# Patient Record
Sex: Female | Born: 1935 | Race: White | Hispanic: No | Marital: Married
Health system: Southern US, Community
[De-identification: ages and names within clinical notes are randomized; demographics above are authoritative.]

## PROBLEM LIST (undated history)

## (undated) DIAGNOSIS — I809 Phlebitis and thrombophlebitis of unspecified site: Secondary | ICD-10-CM

## (undated) DIAGNOSIS — C801 Malignant (primary) neoplasm, unspecified: Secondary | ICD-10-CM

## (undated) DIAGNOSIS — T7840XA Allergy, unspecified, initial encounter: Secondary | ICD-10-CM

## (undated) DIAGNOSIS — Z8041 Family history of malignant neoplasm of ovary: Secondary | ICD-10-CM

## (undated) DIAGNOSIS — R42 Dizziness and giddiness: Secondary | ICD-10-CM

## (undated) DIAGNOSIS — C50919 Malignant neoplasm of unspecified site of unspecified female breast: Secondary | ICD-10-CM

## (undated) DIAGNOSIS — Z803 Family history of malignant neoplasm of breast: Secondary | ICD-10-CM

## (undated) DIAGNOSIS — I739 Peripheral vascular disease, unspecified: Secondary | ICD-10-CM

## (undated) DIAGNOSIS — Z923 Personal history of irradiation: Secondary | ICD-10-CM

## (undated) DIAGNOSIS — E079 Disorder of thyroid, unspecified: Secondary | ICD-10-CM

## (undated) DIAGNOSIS — I82409 Acute embolism and thrombosis of unspecified deep veins of unspecified lower extremity: Secondary | ICD-10-CM

## (undated) DIAGNOSIS — M1990 Unspecified osteoarthritis, unspecified site: Secondary | ICD-10-CM

## (undated) DIAGNOSIS — Z800 Family history of malignant neoplasm of digestive organs: Secondary | ICD-10-CM

## (undated) DIAGNOSIS — M79606 Pain in leg, unspecified: Secondary | ICD-10-CM

## (undated) HISTORY — PX: FOOT SURGERY: SHX648

## (undated) HISTORY — DX: Phlebitis and thrombophlebitis of unspecified site: I80.9

## (undated) HISTORY — DX: Dizziness and giddiness: R42

## (undated) HISTORY — PX: TONSILLECTOMY: SUR1361

## (undated) HISTORY — PX: HEMORRHOID SURGERY: SHX153

## (undated) HISTORY — DX: Family history of malignant neoplasm of breast: Z80.3

## (undated) HISTORY — DX: Unspecified osteoarthritis, unspecified site: M19.90

## (undated) HISTORY — DX: Family history of malignant neoplasm of digestive organs: Z80.0

## (undated) HISTORY — PX: CYSTOSCOPY: SUR368

## (undated) HISTORY — PX: DEBRIDEMENT TENNIS ELBOW: SHX1442

## (undated) HISTORY — DX: Disorder of thyroid, unspecified: E07.9

## (undated) HISTORY — DX: Peripheral vascular disease, unspecified: I73.9

## (undated) HISTORY — DX: Malignant (primary) neoplasm, unspecified: C80.1

## (undated) HISTORY — DX: Pain in leg, unspecified: M79.606

## (undated) HISTORY — PX: TOE ARTHROPLASTY: SHX6504

## (undated) HISTORY — DX: Family history of malignant neoplasm of ovary: Z80.41

## (undated) HISTORY — DX: Allergy, unspecified, initial encounter: T78.40XA

## (undated) HISTORY — DX: Acute embolism and thrombosis of unspecified deep veins of unspecified lower extremity: I82.409

---

## 1978-05-31 HISTORY — PX: OTHER SURGICAL HISTORY: SHX169

## 1998-06-23 ENCOUNTER — Other Ambulatory Visit: Admission: RE | Admit: 1998-06-23 | Discharge: 1998-06-23 | Payer: Self-pay | Admitting: Obstetrics & Gynecology

## 1999-06-24 ENCOUNTER — Other Ambulatory Visit: Admission: RE | Admit: 1999-06-24 | Discharge: 1999-06-24 | Payer: Self-pay | Admitting: Obstetrics & Gynecology

## 2000-05-16 ENCOUNTER — Other Ambulatory Visit: Admission: RE | Admit: 2000-05-16 | Discharge: 2000-05-16 | Payer: Self-pay | Admitting: Obstetrics & Gynecology

## 2001-06-02 ENCOUNTER — Other Ambulatory Visit: Admission: RE | Admit: 2001-06-02 | Discharge: 2001-06-02 | Payer: Self-pay | Admitting: Obstetrics & Gynecology

## 2001-11-22 ENCOUNTER — Encounter: Admission: RE | Admit: 2001-11-22 | Discharge: 2001-11-22 | Payer: Self-pay | Admitting: Rheumatology

## 2001-11-22 ENCOUNTER — Encounter: Payer: Self-pay | Admitting: Rheumatology

## 2002-06-11 ENCOUNTER — Other Ambulatory Visit: Admission: RE | Admit: 2002-06-11 | Discharge: 2002-06-11 | Payer: Self-pay | Admitting: Obstetrics & Gynecology

## 2004-06-24 ENCOUNTER — Other Ambulatory Visit: Admission: RE | Admit: 2004-06-24 | Discharge: 2004-06-24 | Payer: Self-pay | Admitting: Obstetrics & Gynecology

## 2005-05-31 HISTORY — PX: BREAST LUMPECTOMY: SHX2

## 2005-06-25 ENCOUNTER — Other Ambulatory Visit: Admission: RE | Admit: 2005-06-25 | Discharge: 2005-06-25 | Payer: Self-pay | Admitting: Obstetrics & Gynecology

## 2005-07-15 ENCOUNTER — Encounter (INDEPENDENT_AMBULATORY_CARE_PROVIDER_SITE_OTHER): Payer: Self-pay | Admitting: *Deleted

## 2005-07-15 ENCOUNTER — Encounter (INDEPENDENT_AMBULATORY_CARE_PROVIDER_SITE_OTHER): Payer: Self-pay | Admitting: Radiology

## 2005-07-15 ENCOUNTER — Encounter: Admission: RE | Admit: 2005-07-15 | Discharge: 2005-07-15 | Payer: Self-pay | Admitting: Obstetrics & Gynecology

## 2005-07-27 ENCOUNTER — Encounter: Admission: RE | Admit: 2005-07-27 | Discharge: 2005-07-27 | Payer: Self-pay | Admitting: General Surgery

## 2005-08-06 ENCOUNTER — Encounter: Admission: RE | Admit: 2005-08-06 | Discharge: 2005-08-06 | Payer: Self-pay | Admitting: General Surgery

## 2005-08-09 ENCOUNTER — Encounter: Admission: RE | Admit: 2005-08-09 | Discharge: 2005-08-09 | Payer: Self-pay | Admitting: General Surgery

## 2005-08-09 ENCOUNTER — Ambulatory Visit (HOSPITAL_BASED_OUTPATIENT_CLINIC_OR_DEPARTMENT_OTHER): Admission: RE | Admit: 2005-08-09 | Discharge: 2005-08-09 | Payer: Self-pay | Admitting: General Surgery

## 2005-08-09 ENCOUNTER — Encounter (INDEPENDENT_AMBULATORY_CARE_PROVIDER_SITE_OTHER): Payer: Self-pay | Admitting: Specialist

## 2005-08-13 ENCOUNTER — Ambulatory Visit: Payer: Self-pay | Admitting: Oncology

## 2005-08-19 ENCOUNTER — Ambulatory Visit: Admission: RE | Admit: 2005-08-19 | Discharge: 2005-11-17 | Payer: Self-pay | Admitting: *Deleted

## 2005-09-20 LAB — CBC WITH DIFFERENTIAL/PLATELET
BASO%: 0.4 % (ref 0.0–2.0)
Basophils Absolute: 0 10*3/uL (ref 0.0–0.1)
EOS%: 1 % (ref 0.0–7.0)
HCT: 37.5 % (ref 34.8–46.6)
HGB: 13.1 g/dL (ref 11.6–15.9)
MCH: 33.1 pg (ref 26.0–34.0)
MCHC: 34.8 g/dL (ref 32.0–36.0)
MCV: 95.2 fL (ref 81.0–101.0)
MONO%: 6.8 % (ref 0.0–13.0)
NEUT%: 59.2 % (ref 39.6–76.8)
RDW: 13.1 % (ref 11.3–14.5)

## 2005-09-20 LAB — COMPREHENSIVE METABOLIC PANEL
AST: 15 U/L (ref 0–37)
Alkaline Phosphatase: 57 U/L (ref 39–117)
BUN: 21 mg/dL (ref 6–23)
Creatinine, Ser: 0.9 mg/dL (ref 0.4–1.2)

## 2005-10-05 ENCOUNTER — Encounter: Admission: RE | Admit: 2005-10-05 | Discharge: 2005-10-05 | Payer: Self-pay | Admitting: *Deleted

## 2005-11-18 ENCOUNTER — Ambulatory Visit: Admission: RE | Admit: 2005-11-18 | Discharge: 2005-12-23 | Payer: Self-pay | Admitting: *Deleted

## 2005-12-14 ENCOUNTER — Ambulatory Visit: Payer: Self-pay | Admitting: Oncology

## 2005-12-14 LAB — COMPREHENSIVE METABOLIC PANEL
ALT: 11 U/L (ref 0–40)
AST: 14 U/L (ref 0–37)
CO2: 30 mEq/L (ref 19–32)
Calcium: 9 mg/dL (ref 8.4–10.5)
Chloride: 104 mEq/L (ref 96–112)
Creatinine, Ser: 0.79 mg/dL (ref 0.40–1.20)
Potassium: 3.9 mEq/L (ref 3.5–5.3)
Sodium: 140 mEq/L (ref 135–145)
Total Protein: 6.7 g/dL (ref 6.0–8.3)

## 2005-12-14 LAB — CBC WITH DIFFERENTIAL/PLATELET
BASO%: 0.3 % (ref 0.0–2.0)
EOS%: 2.1 % (ref 0.0–7.0)
MCH: 33.4 pg (ref 26.0–34.0)
MCHC: 34.9 g/dL (ref 32.0–36.0)
MONO#: 0.4 10*3/uL (ref 0.1–0.9)
NEUT%: 60.8 % (ref 39.6–76.8)
RBC: 3.99 10*6/uL (ref 3.70–5.32)
RDW: 13.6 % (ref 11.3–14.5)
WBC: 4.6 10*3/uL (ref 3.9–10.0)
lymph#: 1.3 10*3/uL (ref 0.9–3.3)

## 2006-06-24 ENCOUNTER — Ambulatory Visit: Payer: Self-pay | Admitting: Oncology

## 2006-06-28 LAB — COMPREHENSIVE METABOLIC PANEL
Albumin: 3.9 g/dL (ref 3.5–5.2)
Alkaline Phosphatase: 60 U/L (ref 39–117)
BUN: 19 mg/dL (ref 6–23)
Glucose, Bld: 86 mg/dL (ref 70–99)
Potassium: 4 mEq/L (ref 3.5–5.3)
Total Bilirubin: 0.5 mg/dL (ref 0.3–1.2)

## 2006-06-28 LAB — CANCER ANTIGEN 27.29: CA 27.29: 33 U/mL (ref 0–39)

## 2006-06-28 LAB — CBC WITH DIFFERENTIAL/PLATELET
Basophils Absolute: 0 10*3/uL (ref 0.0–0.1)
Eosinophils Absolute: 0.1 10*3/uL (ref 0.0–0.5)
HCT: 37.8 % (ref 34.8–46.6)
HGB: 13.1 g/dL (ref 11.6–15.9)
LYMPH%: 30.8 % (ref 14.0–48.0)
MCV: 97.7 fL (ref 81.0–101.0)
MONO%: 7.1 % (ref 0.0–13.0)
NEUT#: 2.7 10*3/uL (ref 1.5–6.5)
NEUT%: 60.2 % (ref 39.6–76.8)
Platelets: 184 10*3/uL (ref 145–400)

## 2006-07-07 ENCOUNTER — Encounter: Admission: RE | Admit: 2006-07-07 | Discharge: 2006-07-07 | Payer: Self-pay | Admitting: Obstetrics & Gynecology

## 2006-08-01 ENCOUNTER — Encounter: Admission: RE | Admit: 2006-08-01 | Discharge: 2006-08-01 | Payer: Self-pay | Admitting: Family Medicine

## 2006-10-28 ENCOUNTER — Encounter: Admission: RE | Admit: 2006-10-28 | Discharge: 2006-10-28 | Payer: Self-pay | Admitting: Unknown Physician Specialty

## 2006-12-26 ENCOUNTER — Ambulatory Visit: Payer: Self-pay | Admitting: Oncology

## 2006-12-28 LAB — COMPREHENSIVE METABOLIC PANEL
ALT: 13 U/L (ref 0–35)
Albumin: 3.6 g/dL (ref 3.5–5.2)
CO2: 27 mEq/L (ref 19–32)
Calcium: 8.6 mg/dL (ref 8.4–10.5)
Chloride: 108 mEq/L (ref 96–112)
Potassium: 4 mEq/L (ref 3.5–5.3)
Sodium: 147 mEq/L — ABNORMAL HIGH (ref 135–145)
Total Protein: 6.2 g/dL (ref 6.0–8.3)

## 2006-12-28 LAB — CBC WITH DIFFERENTIAL/PLATELET
BASO%: 0.5 % (ref 0.0–2.0)
HCT: 36.3 % (ref 34.8–46.6)
MCHC: 35.5 g/dL (ref 32.0–36.0)
MONO#: 0.3 10*3/uL (ref 0.1–0.9)
NEUT%: 63.9 % (ref 39.6–76.8)
WBC: 4.9 10*3/uL (ref 3.9–10.0)
lymph#: 1.4 10*3/uL (ref 0.9–3.3)

## 2006-12-28 LAB — LACTATE DEHYDROGENASE: LDH: 157 U/L (ref 94–250)

## 2007-03-01 ENCOUNTER — Ambulatory Visit: Payer: Self-pay | Admitting: Oncology

## 2007-07-11 ENCOUNTER — Encounter: Admission: RE | Admit: 2007-07-11 | Discharge: 2007-07-11 | Payer: Self-pay | Admitting: Oncology

## 2007-12-22 ENCOUNTER — Ambulatory Visit: Payer: Self-pay | Admitting: Oncology

## 2007-12-22 LAB — COMPREHENSIVE METABOLIC PANEL
Alkaline Phosphatase: 53 U/L (ref 39–117)
BUN: 20 mg/dL (ref 6–23)
Creatinine, Ser: 0.92 mg/dL (ref 0.40–1.20)
Glucose, Bld: 96 mg/dL (ref 70–99)
Sodium: 138 mEq/L (ref 135–145)
Total Bilirubin: 0.8 mg/dL (ref 0.3–1.2)
Total Protein: 6.5 g/dL (ref 6.0–8.3)

## 2007-12-22 LAB — CBC WITH DIFFERENTIAL/PLATELET
Eosinophils Absolute: 0.1 10*3/uL (ref 0.0–0.5)
LYMPH%: 30.8 % (ref 14.0–48.0)
MCV: 98.3 fL (ref 81.0–101.0)
MONO%: 6.9 % (ref 0.0–13.0)
NEUT#: 3.4 10*3/uL (ref 1.5–6.5)
NEUT%: 60.8 % (ref 39.6–76.8)
Platelets: 212 10*3/uL (ref 145–400)
RBC: 3.67 10*6/uL — ABNORMAL LOW (ref 3.70–5.32)

## 2008-07-11 ENCOUNTER — Encounter: Admission: RE | Admit: 2008-07-11 | Discharge: 2008-07-11 | Payer: Self-pay | Admitting: Oncology

## 2008-10-04 ENCOUNTER — Ambulatory Visit (HOSPITAL_BASED_OUTPATIENT_CLINIC_OR_DEPARTMENT_OTHER): Admission: RE | Admit: 2008-10-04 | Discharge: 2008-10-04 | Payer: Self-pay | Admitting: Urology

## 2008-10-04 ENCOUNTER — Encounter (INDEPENDENT_AMBULATORY_CARE_PROVIDER_SITE_OTHER): Payer: Self-pay | Admitting: Urology

## 2008-10-10 ENCOUNTER — Ambulatory Visit (HOSPITAL_COMMUNITY): Admission: RE | Admit: 2008-10-10 | Discharge: 2008-10-10 | Payer: Self-pay | Admitting: Urology

## 2008-12-24 ENCOUNTER — Ambulatory Visit: Payer: Self-pay | Admitting: Oncology

## 2008-12-24 LAB — CBC WITH DIFFERENTIAL/PLATELET
Basophils Absolute: 0 10*3/uL (ref 0.0–0.1)
EOS%: 0.9 % (ref 0.0–7.0)
HCT: 37.5 % (ref 34.8–46.6)
HGB: 12.8 g/dL (ref 11.6–15.9)
MCH: 33.3 pg (ref 25.1–34.0)
MCV: 97.8 fL (ref 79.5–101.0)
NEUT%: 60.6 % (ref 38.4–76.8)
lymph#: 1.6 10*3/uL (ref 0.9–3.3)

## 2008-12-25 LAB — COMPREHENSIVE METABOLIC PANEL
AST: 14 U/L (ref 0–37)
BUN: 22 mg/dL (ref 6–23)
Calcium: 9 mg/dL (ref 8.4–10.5)
Chloride: 102 mEq/L (ref 96–112)
Creatinine, Ser: 0.9 mg/dL (ref 0.40–1.20)
Glucose, Bld: 90 mg/dL (ref 70–99)

## 2008-12-25 LAB — LACTATE DEHYDROGENASE: LDH: 178 U/L (ref 94–250)

## 2009-07-14 ENCOUNTER — Encounter: Admission: RE | Admit: 2009-07-14 | Discharge: 2009-07-14 | Payer: Self-pay | Admitting: Oncology

## 2009-12-23 ENCOUNTER — Ambulatory Visit: Payer: Self-pay | Admitting: Oncology

## 2009-12-25 LAB — COMPREHENSIVE METABOLIC PANEL
ALT: 11 U/L (ref 0–35)
AST: 17 U/L (ref 0–37)
Albumin: 4 g/dL (ref 3.5–5.2)
Alkaline Phosphatase: 56 U/L (ref 39–117)
Chloride: 99 mEq/L (ref 96–112)
Creatinine, Ser: 1.01 mg/dL (ref 0.40–1.20)
Total Bilirubin: 0.5 mg/dL (ref 0.3–1.2)

## 2009-12-25 LAB — CBC WITH DIFFERENTIAL/PLATELET
BASO%: 0.3 % (ref 0.0–2.0)
EOS%: 0.8 % (ref 0.0–7.0)
LYMPH%: 32.3 % (ref 14.0–49.7)
MCV: 97.9 fL (ref 79.5–101.0)
MONO%: 6.1 % (ref 0.0–14.0)
NEUT#: 3.8 10*3/uL (ref 1.5–6.5)
NEUT%: 60.5 % (ref 38.4–76.8)
Platelets: 195 10*3/uL (ref 145–400)
RDW: 13.2 % (ref 11.2–14.5)
WBC: 6.3 10*3/uL (ref 3.9–10.3)

## 2010-06-20 ENCOUNTER — Other Ambulatory Visit: Payer: Self-pay | Admitting: Oncology

## 2010-06-20 DIAGNOSIS — Z1231 Encounter for screening mammogram for malignant neoplasm of breast: Secondary | ICD-10-CM

## 2010-07-15 ENCOUNTER — Ambulatory Visit
Admission: RE | Admit: 2010-07-15 | Discharge: 2010-07-15 | Disposition: A | Discharge status: Home / Self Care | Payer: Medicare Other | Source: Ambulatory Visit | Attending: Oncology | Admitting: Oncology

## 2010-07-15 DIAGNOSIS — Z1231 Encounter for screening mammogram for malignant neoplasm of breast: Secondary | ICD-10-CM

## 2010-08-10 ENCOUNTER — Other Ambulatory Visit: Payer: Self-pay | Admitting: Obstetrics & Gynecology

## 2010-09-08 LAB — POCT I-STAT 4, (NA,K, GLUC, HGB,HCT)
HCT: 43 % (ref 36.0–46.0)
Hemoglobin: 14.6 g/dL (ref 12.0–15.0)

## 2010-10-13 NOTE — Op Note (Signed)
NAMEDARI, CARPENITO            ACCOUNT NO.:  [id number]   MEDICAL RECORD NO.:  [id number]          PATIENT TYPE:  AMB   LOCATION:  [facility]                         FACILITY:  [facility]   PHYSICIAN:  Ronald L. Earlene Plater, M.D.  DATE OF BIRTH:  1932/08/08   DATE OF PROCEDURE:  10/04/2008  DATE OF DISCHARGE:                               OPERATIVE REPORT   DIAGNOSES:  Periurethral lesion, questionable urethral caruncle.   OPERATIVE PROCEDURE:  Excision of periurethral lesion and rigid  cystourethroscopy.   SURGEON:  Dr. Gaynelle Arabian.   ANESTHESIA:  LMA.   ESTIMATED BLOOD LOSS:  15 mL.   TUBES:  A 16-French Silastic Foley catheter.   COMPLICATIONS:  None.   INDICATIONS FOR PROCEDURE:  Ms. Chisom is a very nice 77 year old  white female who has a history of breast cancer.  She presents with  periodic vaginal bleeding.  On examination in the 6 o'clock position has  a significantly reddened inflamed lesion approximately 1 cm in length  extending from the edge of the urethra.  It was felt to most likely be a  urethral caruncle, although it was fairly large for this.  It was  causing some irritation and some bleeding and she could not go on  estrogen due to her prior breast cancer.  After understanding risks,  benefits and alternatives, she has elected to proceed with excisional  biopsy of the lesion and cystourethroscopy.   PROCEDURE IN DETAIL:  The patient was placed in the supine position.  After proper LMA anesthesia, she was placed in the dorsal lithotomy  position and prepped and draped with Betadine in a sterile fashion.  The  lesion was grasped with pickups and excised and submitted to pathology.  The mucosal margin was approximated to the vaginal edge of the urethra  with a running 4-0 chromic catgut.  No other lesions were noted to be  present.  Rigid cystourethroscopy was then performed.  The bladder was  smooth-walled.  Efflux of clear urine was noted from the normally placed  ureteral orifices bilaterally.  The bladder was carefully inspected with  12 and 70 degree lenses and the entire urethra was looked at and noted  to be without lesions.  They cystourethroscope was removed visually and  a 16-French Silastic Foley catheter was passed and the urine was drained  clear.  It might be noted that all intraoperative equipment was latex  freed due to her possible latex allergy.  Urine was clear.  The patient  tolerated the procedure well and was taken to the recovery room stable.      Ronald L. Earlene Plater, M.D.  Electronically Signed     RLD/MEDQ  D:  10/04/2008  T:  10/04/2008  Job:  [id number]   cc:   Ilda Mori, M.D.  Fax: [phone]

## 2010-11-02 ENCOUNTER — Other Ambulatory Visit: Payer: Self-pay | Admitting: Gastroenterology

## 2011-01-11 ENCOUNTER — Other Ambulatory Visit: Payer: Self-pay | Admitting: Oncology

## 2011-01-11 ENCOUNTER — Encounter (HOSPITAL_BASED_OUTPATIENT_CLINIC_OR_DEPARTMENT_OTHER): Payer: Medicare Other | Admitting: Oncology

## 2011-01-11 DIAGNOSIS — D0590 Unspecified type of carcinoma in situ of unspecified breast: Secondary | ICD-10-CM

## 2011-01-11 LAB — CBC WITH DIFFERENTIAL/PLATELET
Eosinophils Absolute: 0.1 10*3/uL (ref 0.0–0.5)
HCT: 36.6 % (ref 34.8–46.6)
HGB: 12.8 g/dL (ref 11.6–15.9)
LYMPH%: 35.1 % (ref 14.0–49.7)
MCH: 34.2 pg — ABNORMAL HIGH (ref 25.1–34.0)
MCV: 97.6 fL (ref 79.5–101.0)
MONO%: 6.2 % (ref 0.0–14.0)
NEUT#: 2.9 10*3/uL (ref 1.5–6.5)
NEUT%: 56.7 % (ref 38.4–76.8)
Platelets: 182 10*3/uL (ref 145–400)

## 2011-01-12 LAB — VITAMIN D 25 HYDROXY (VIT D DEFICIENCY, FRACTURES): Vit D, 25-Hydroxy: 33 ng/mL (ref 30–89)

## 2011-01-12 LAB — COMPREHENSIVE METABOLIC PANEL WITH GFR
ALT: 11 U/L (ref 0–35)
AST: 15 U/L (ref 0–37)
Albumin: 4.1 g/dL (ref 3.5–5.2)
Alkaline Phosphatase: 60 U/L (ref 39–117)
BUN: 20 mg/dL (ref 6–23)
CO2: 27 meq/L (ref 19–32)
Calcium: 9.6 mg/dL (ref 8.4–10.5)
Chloride: 104 meq/L (ref 96–112)
Creatinine, Ser: 0.92 mg/dL (ref 0.50–1.10)
Glucose, Bld: 96 mg/dL (ref 70–99)
Potassium: 4 meq/L (ref 3.5–5.3)
Sodium: 141 meq/L (ref 135–145)
Total Bilirubin: 0.6 mg/dL (ref 0.3–1.2)
Total Protein: 6.8 g/dL (ref 6.0–8.3)

## 2011-01-18 ENCOUNTER — Encounter: Payer: Medicare Other | Admitting: Oncology

## 2011-02-22 ENCOUNTER — Encounter: Payer: Self-pay | Admitting: Vascular Surgery

## 2011-02-24 ENCOUNTER — Encounter: Payer: Self-pay | Admitting: Vascular Surgery

## 2011-02-25 ENCOUNTER — Encounter: Payer: Self-pay | Admitting: Vascular Surgery

## 2011-02-25 ENCOUNTER — Ambulatory Visit (INDEPENDENT_AMBULATORY_CARE_PROVIDER_SITE_OTHER): Payer: Medicare Other | Admitting: Vascular Surgery

## 2011-02-25 VITALS — BP 156/79 | HR 50 | Resp 16 | Ht 69.0 in | Wt 167.0 lb

## 2011-02-25 DIAGNOSIS — M7989 Other specified soft tissue disorders: Secondary | ICD-10-CM

## 2011-02-25 NOTE — Progress Notes (Signed)
[facility] HISTORY AND PHYSICAL   CC: Referring Physician:  History of Present Illness:  Patient is a 77 y.o. year old female who presents for evaluation of left lower extremity swelling. Patient states she has a chronic problems with her left leg for several years. However over the last few months the left leg is worse. She does not know she had a DVT in the past but says she has had several episodes of thrombophlebitis. The leg hurts occasionally and is worse on some days than others. He does not really related to sitting standing or ambulation. She has been wearing compression stockings since 2001 on a daily basis. These are thigh high. She has no history of hypercoagulable state. She has no significant family history of venous disease. She has had no prior ulcerations. She has noticed some skin discoloration that is occurred recently.  Past Medical History  Diagnosis Date  . Leg pain   . Thyroid disease   . Cancer     Right breast  . Phlebitis   . Peripheral vascular disease   . Arthritis   . Vertigo     Past Surgical History  Procedure Date  . Breast lumpectomy 2007    Right lumpectomy with radiation  . Tonsillectomy   . Hemorrhoid surgery   . Debridement tennis elbow     Right elbow   . Cystoscopy     For lesion of the urethra    ROS: [x]  Positive   [ ]  Negative   [ ]  All sytems reviewed and are negative  General:[ ]  Weight loss, [ ]  Fever, [ ]  chills Neurologic: [x ] Dizziness, [ ]  Blackouts, [ ]  Seizure [ ]  Stroke, [ ]  "Mini stroke", [ ]  Slurred speech, [ ]  Temporary blindness;  [ ] weakness,  [ ]  Hoarseness Cardiac: [ ]  Chest pain/pressure, [ ]  Shortness of breath at rest [ ]  Shortness of breath with exertion,  [ ]   Atrial fibrillation or irregular heartbeat Vascular:[x ] Pain in legs with walking, [ ]  Pain in legs at rest ,[ ]  Pain in legs at night,  [ ]   Non-healing ulcer, [x ] Blood clot in vein/DVT,   Pulmonary: [ ]  Home oxygen, [ ]    Productive cough, [ ]  Coughing up blood,  [ ]  Asthma,  [ ]  Wheezing Musculoskeletal:  [x ] Arthritis, [ ]  Low back pain,  [ ]  Joint pain Hematologic:[ ]  Easy Bruising, [ ]  Anemia; [ ]  Hepatitis Gastrointestinal: [ ]  Blood in stool,  [ ]  Gastroesophageal Reflux, [ ]  Trouble swallowing Urinary: [ ]  chronic Kidney disease, [ ]  on HD - [ ]  MWF or [ ]  TTHS, [ ]  Burning with urination, [ ]  Frequent urination, [ ]  Difficulty urinating;  Skin: [ ]  Rashes, [ ]  Wounds Psychological: [ ]  Anxiety,  [ ]  Depression  Social History History  Substance Use Topics  . Smoking status: Never Smoker   . Smokeless tobacco: Never Used  . Alcohol Use: No    Family History Family History  Problem Relation Age of Onset  . Cancer Sister     breast cancer    Allergies  Allergies  Allergen Reactions  . Demerol   . Ibuprofen      Current Outpatient Prescriptions  Medication Sig Dispense Refill  . fluticasone (FLONASE) 50 MCG/ACT nasal spray Place 2 sprays into the nose daily.        Marland Kitchen levothyroxine (SYNTHROID, LEVOTHROID) 88 MCG tablet Take 88 mcg by mouth  daily.        . triamterene-hydrochlorothiazide (DYAZIDE) 37.5-25 MG per capsule Take 1 capsule by mouth every morning.          Physical Examination  Filed Vitals:   02/25/11 1346  BP: 156/79  Pulse: 50  Resp: 16  Height: 5\' 9"  (1.753 m)  Weight: 167 lb (75.751 kg)  SpO2: 97%    Body mass index is 24.66 kg/(m^2).  General:  Alert and oriented, no acute distress HEENT: Normal Neck: No bruit or JVD Pulmonary: Clear to auscultation bilaterally Cardiac: Regular Rate and Rhythm without murmur Gastrointestinal: Soft, non-tender, non-distended, no mass, no scars Skin: No rash, hemosiderin staining left gaiter area medially, no ulcer Extremity Pulses:  2+ radial, brachial, femoral, dorsalis pedis, posterior tibial pulses bilaterally Musculoskeletal: No deformity, left leg edema pitting approx 25% larger than right Neurologic: Upper and  lower extremity motor 5/5 and symmetric  DATA:   Venous duplex from July was reviewed which showed no evidence of DVT ASSESSMENT:   Left lower term a swelling with 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  PLAN: #1 patient will continue her compression therapy of her lower extremities as she has done for several years.  #2 we will obtain a venous duplex exam for competency patency of her deep and superficial venous system and she will return for followup in 2 weeks  #3 patient was reassured today that the venous disease may be chronic in nature but did she does not appear to have an acute clot the hemosiderin staining however may be permanent and make it worse over time if she does have post phlebitic syndrome

## 2011-03-08 ENCOUNTER — Other Ambulatory Visit: Payer: Self-pay | Admitting: Lab

## 2011-03-10 ENCOUNTER — Encounter: Payer: Self-pay | Admitting: Vascular Surgery

## 2011-03-11 ENCOUNTER — Encounter: Payer: Self-pay | Admitting: Vascular Surgery

## 2011-03-11 ENCOUNTER — Ambulatory Visit (INDEPENDENT_AMBULATORY_CARE_PROVIDER_SITE_OTHER): Payer: Medicare Other | Admitting: Vascular Surgery

## 2011-03-11 ENCOUNTER — Other Ambulatory Visit (INDEPENDENT_AMBULATORY_CARE_PROVIDER_SITE_OTHER): Payer: Medicare Other | Admitting: *Deleted

## 2011-03-11 VITALS — BP 147/72 | HR 58 | Resp 16 | Ht 69.0 in | Wt 160.5 lb

## 2011-03-11 DIAGNOSIS — I83893 Varicose veins of bilateral lower extremities with other complications: Secondary | ICD-10-CM

## 2011-03-11 DIAGNOSIS — M79609 Pain in unspecified limb: Secondary | ICD-10-CM

## 2011-03-11 NOTE — Progress Notes (Signed)
Patient returns today for followup. She had a venous duplex exam for competency and patency today. I reviewed and interpreted or venous duplex exam which shows incompetence of the deep and superficial system in the left leg. She did have a competent right superficial and deep system. She has developed a small ulceration on the posterior aspect of the left calf. This is very superficial and should heal up with compression alone. Her greater saphenous diameter on the left side was between 63 and 80 mm in diameter. She has been compliant with her compression stockings since her last visit. We discussed with her continued compliance of her thigh high compression stockings. She will also continue to use nonsteroidals or Tylenol for pain as needed.  Review of systems: She denies shortness of breath or chest pain  Physical Exam: Filed Vitals:   03/11/11 1026  BP: 147/72  Pulse: 58  Resp: 16  Height: 5\' 9"  (1.753 m)  Weight: 160 lb 8 oz (72.802 kg)   Left lower extremity edema unchanged from her previous office visit she has scattered varicosities and spider veins in both lower extremities. She has a less than 1 mm in depth ulceration which is 2 x 1 cm in diameter on the left posterior medial calf, skin otherwise is intact  Assessment: Post phlebitic syndrome left lower extremity but also incompetence of the superficial venous system of the left lower extremity. I believe her symptoms might be improved somewhat by ablation of her left lower extremity. She will try conservative measures for the next few months to see she denies any significant improvement. She returned 3 months time to be seen by Dr. Arbie Cookey or possible evaluation of laser ablation of her left greater saphenous vein.

## 2011-03-16 NOTE — Procedures (Unsigned)
LOWER EXTREMITY VENOUS REFLUX EXAM  INDICATION:  Bilateral lower extremity swelling with varicose veins.  EXAM:  Using color-flow imaging and pulse Doppler spectral analysis, the bilateral common femoral, superficial femoral, popliteal, posterior tibial, greater and lesser saphenous veins are evaluated.  There is evidence suggesting deep venous insufficiency in the left lower extremity.  The left saphenofemoral junction is not competent with Reflux of >573milliseconds. The  GSV is not competent with Reflux of >510milliseconds with the caliber as described below. The right saphenofemoral junction and GSV are competent.   The bilateral proximal short saphenous veins demonstrate competency.  GSV Diameter (used if found to be incompetent only)                                           Right    Left Proximal Greater Saphenous Vein           cm       0.79 cm Proximal-to-mid-thigh                     cm       0.61 cm Mid thigh                                 cm       0.63 cm Mid-distal thigh                          cm       cm Distal thigh                              cm       0.63 cm Knee                                      cm       0.4 cm  IMPRESSION: 1. Left greater saphenous vein is not competent with reflux     >536milliseconds. 2. The left greater saphenous vein is not tortuous. 3. The deep venous system on the left is incompetent with reflux >500     milliseconds. 4. The bilateral small saphenous veins are competent. 5. The right greater saphenous vein is competent.     ___________________________________________ Janetta Hora. Fields, MD  LT/MEDQ  D:  03/11/2011  T:  03/11/2011  Job:  [id number]

## 2011-06-14 ENCOUNTER — Encounter: Payer: Self-pay | Admitting: Vascular Surgery

## 2011-06-14 ENCOUNTER — Other Ambulatory Visit: Payer: Self-pay | Admitting: Oncology

## 2011-06-14 DIAGNOSIS — Z9889 Other specified postprocedural states: Secondary | ICD-10-CM

## 2011-06-14 DIAGNOSIS — Z853 Personal history of malignant neoplasm of breast: Secondary | ICD-10-CM

## 2011-06-15 ENCOUNTER — Encounter: Payer: Self-pay | Admitting: Vascular Surgery

## 2011-06-15 ENCOUNTER — Ambulatory Visit (INDEPENDENT_AMBULATORY_CARE_PROVIDER_SITE_OTHER): Payer: Medicare Other | Admitting: Vascular Surgery

## 2011-06-15 VITALS — BP 137/72 | HR 56 | Resp 18 | Ht 70.0 in | Wt 161.5 lb

## 2011-06-15 DIAGNOSIS — I83893 Varicose veins of bilateral lower extremities with other complications: Secondary | ICD-10-CM | POA: Insufficient documentation

## 2011-06-15 NOTE — Progress Notes (Signed)
Problems with Activities of Daily Living Secondary to Leg Pain  1.  Mrs. Hinchey states she has had to decrease her gardening activities and walking on the treadmill for exercise due to leg pain.  2.  Mrs. Buntrock states that prolonged standing especially working in [employer] is very difficult due to leg pain.  3. Mrs. Zaugg states that prolonged sitting especially on long car rides is very difficult due to leg pain.   Failure of  Conservative Therapy:  1. Worn 20-30 mm Hg thigh high compression hose >3 months with no relief of symptoms.  2. Frequently elevates legs-no relief of symptoms  3. Taken Ibuprofen 600 Mg TID with no relief of symptoms.  The patient has markedly progressive changes of venous stasis disease in her left leg with hemosiderin deposit and excoriated skin. She is having some pain and swelling despite very good compliance with compression and elevation. I reimaged her vein with SonoSite ultrasound and this does show marked dilatation of her great saphenous vein. She does have 2+ dorsalis pedis pulse. I discussed options and recommended laser ablation of her great saphenous vein for improvement in her venous hypertension. She understands that she does have deep venous insufficiency as well and therefore cannot have complete correction of her venous hypertension. She wish to proceed when possible to get her on the schedule

## 2011-06-17 ENCOUNTER — Other Ambulatory Visit: Payer: Self-pay | Admitting: *Deleted

## 2011-06-17 DIAGNOSIS — I83893 Varicose veins of bilateral lower extremities with other complications: Secondary | ICD-10-CM

## 2011-07-05 DIAGNOSIS — Z79899 Other long term (current) drug therapy: Secondary | ICD-10-CM | POA: Diagnosis not present

## 2011-07-05 DIAGNOSIS — E039 Hypothyroidism, unspecified: Secondary | ICD-10-CM | POA: Diagnosis not present

## 2011-07-19 ENCOUNTER — Ambulatory Visit
Admission: RE | Admit: 2011-07-19 | Discharge: 2011-07-19 | Disposition: A | Discharge status: Home / Self Care | Payer: Medicare Other | Source: Ambulatory Visit | Attending: Oncology | Admitting: Oncology

## 2011-07-19 DIAGNOSIS — Z853 Personal history of malignant neoplasm of breast: Secondary | ICD-10-CM

## 2011-07-19 DIAGNOSIS — Z9889 Other specified postprocedural states: Secondary | ICD-10-CM

## 2011-07-28 ENCOUNTER — Encounter: Payer: Self-pay | Admitting: Vascular Surgery

## 2011-07-29 ENCOUNTER — Ambulatory Visit (INDEPENDENT_AMBULATORY_CARE_PROVIDER_SITE_OTHER): Payer: Medicare Other | Admitting: Vascular Surgery

## 2011-07-29 ENCOUNTER — Encounter: Payer: Self-pay | Admitting: Vascular Surgery

## 2011-07-29 VITALS — BP 152/93 | HR 59 | Resp 18 | Ht 69.0 in | Wt 162.3 lb

## 2011-07-29 DIAGNOSIS — I83893 Varicose veins of bilateral lower extremities with other complications: Secondary | ICD-10-CM | POA: Diagnosis not present

## 2011-07-29 HISTORY — PX: ENDOVENOUS ABLATION SAPHENOUS VEIN W/ LASER: SUR449

## 2011-07-29 NOTE — Progress Notes (Signed)
Laser Ablation Procedure      Date: 07/29/2011    Pamela [name] DOB:[birth year]  Consent signed: Yes  Surgeon:T.F. Gean Larose  Procedure: Laser Ablation: left Greater Saphenous Vein  BP 152/93  Pulse 59  Resp 18  Ht 5\' 9"  (1.753 m)  Wt 162 lb 4.8 oz (73.619 kg)  BMI 23.97 kg/m2  Start time: 10:45AM   End time: 11:45AM  Tumescent Anesthesia: 475 cc 0.9% NaCl with 50 cc Lidocaine HCL with 1% Epi and 15 cc 8.4% NaHCO3  Local Anesthesia: 5 cc Lidocaine HCL and NaHCO3 (ratio 2:1)  Continuous Mode: 15 Watts Total Energy 2726 Joules Total Time3:01       Patient tolerated procedure well: Yes  [name], Pamela Rhymes  Description of Procedure:  After marking the course of the saphenous vein and the secondary varicosities in the standing position, the patient was placed on the operating table in the supine position, and the left leg was prepped and draped in sterile fashion. Local anesthetic was administered, and under ultrasound guidance the saphenous vein was accessed with a micro needle and guide wire; then the micro puncture sheath was placed. A guide wire was inserted to the saphenofemoral junction, followed by a 5 french sheath.  The position of the sheath and then the laser fiber below the junction was confirmed using the ultrasound and visualization of the aiming beam.  Tumescent anesthesia was administered along the course of the saphenous vein using ultrasound guidance. Protective laser glasses were placed on the patient, and the laser was fired at at 15 watt continuous mode.  For a total of 2726 joules.  A steri strip was applied to the puncture site.       ABD pads and thigh high compression stockings were applied.  Ace wrap bandages were applied at the top of the saphenofemoral junction.  Blood loss was less than 15 cc.  The patient ambulated out of the operating room having tolerated the procedure well.  Uneventful ablation from mid calf to saphenofemoral junction. She'll be  seen again in one week

## 2011-07-30 ENCOUNTER — Encounter: Payer: Self-pay | Admitting: Vascular Surgery

## 2011-08-02 ENCOUNTER — Encounter: Payer: Self-pay | Admitting: Vascular Surgery

## 2011-08-03 ENCOUNTER — Ambulatory Visit (INDEPENDENT_AMBULATORY_CARE_PROVIDER_SITE_OTHER): Payer: Medicare Other | Admitting: Vascular Surgery

## 2011-08-03 ENCOUNTER — Telehealth: Payer: Self-pay | Admitting: *Deleted

## 2011-08-03 ENCOUNTER — Other Ambulatory Visit: Payer: Self-pay

## 2011-08-03 ENCOUNTER — Encounter (INDEPENDENT_AMBULATORY_CARE_PROVIDER_SITE_OTHER): Payer: Medicare Other | Admitting: *Deleted

## 2011-08-03 ENCOUNTER — Encounter: Payer: Self-pay | Admitting: Vascular Surgery

## 2011-08-03 VITALS — BP 128/67 | HR 79 | Resp 18 | Ht 69.5 in | Wt 163.0 lb

## 2011-08-03 DIAGNOSIS — Z48812 Encounter for surgical aftercare following surgery on the circulatory system: Secondary | ICD-10-CM | POA: Diagnosis not present

## 2011-08-03 DIAGNOSIS — I83893 Varicose veins of bilateral lower extremities with other complications: Secondary | ICD-10-CM | POA: Diagnosis not present

## 2011-08-03 NOTE — Progress Notes (Signed)
Subjective:     Patient ID: Pamela [name], female   DOB: 1933-01-09, 78 y.o.   MRN: [id number]  HPI the same suture O. female had laser ablation left great saphenous vein performed by Dr. early one week ago. She has had some mild to moderate discomfort along the course of the great saphenous vein in the mid to proximal thighs will expect. She's had no distal edema. She denies any chest pain hemoptysis or dyspnea on exertion.  Review of Systems     Objective:   Physical ExamBP 128/67  Pulse 79  Resp 18  Ht 5' 9.5" (1.765 m)  Wt 163 lb (73.936 kg)  BMI 23.73 kg/m2 Left lower extremity exam reveals 3+ femoral dorsalis pedis pulse palpable. There is no distal edema noted. She has mild discomfort along the course of the great saphenous vein from the saphenofemoral junction to the mid thigh.  The downward a venous duplex exam of the left leg which I reviewed and interpreted. She has total closure of the great saphenous vein up to the saphenofemoral junction with some mobile thrombus extending into the common femoral vein but not obstructing it.    Assessment:     Complete closure left great saphenous vein following laser ablation with some mobile thrombus at saphenofemoral junction This will likely resolve spontaneously but will cover her with Coumadin in the interim. This will be managed by her medical doctor -- per Dr. Leonor Liv    Plan:     Patient to return in 2 months for venous duplex exam to confirm resolution of thrombus at saphenofemoral junction and see Dr. early at that time   The patient did develop sudden severe swelling in left leg chest pain hemoptysis shortness of breath she will go to the emergency department immediately.

## 2011-08-03 NOTE — Telephone Encounter (Signed)
Spoke directly to Dr. Leonor Liv. Told her about the results of the duplex ultrasound today and that Dr. Hart Rochester wants the patient to be put on Coumadin. He also wants her to start a baby aspirin every day. She is to be seen again in 2 months for a repeat ultrasound and to see Dr. Arbie Cookey.

## 2011-08-04 NOTE — Procedures (Unsigned)
DUPLEX DEEP VENOUS EXAM - LOWER EXTREMITY  INDICATION:  Left lower extremity ablation.  HISTORY:  Edema: Trauma/Surgery:  One week post ablation. Pain:  Yes. PE:  No. Previous DVT:  No. Anticoagulants:  No. Other:  DUPLEX EXAM:               CFV   SFV   PopV  PTV    GSV               R  L  R  L  R  L  R   L  R  L Thrombosis       +     0     0      0     Closed Spontaneous      +     +     +      + Phasic           +     +     +      + Augmentation     + Compressible Competent  Legend:  + - yes  o - no  p - partial  D - decreased  IMPRESSION: 1. Acute mobile thrombus on the left common femoral vein at     saphenofemoral junction.  This is suggestive of acute deep vein     thrombosis. 2. The greater saphenous vein appears closed from the saphenofemoral     junction to below knee proximal calf.     _____________________________ Pamela [name]. Hart Rochester, M.D.  SS/MEDQ  D:  08/03/2011  T:  08/03/2011  Job:  [id number]

## 2011-08-10 ENCOUNTER — Telehealth: Payer: Self-pay | Admitting: *Deleted

## 2011-08-10 NOTE — Telephone Encounter (Signed)
08/10/2011  Time: 3:08 PM   Patient Name: Pamela [name]  Patient of: T.F. Early  Procedure:Laser Ablation left great saphenous vein  07-29-2011  Reached patient at home and checked  Her status  Yes    Comments/Actions Taken:  Pamela [name] states she is experiencing moderate discomfort left inner thigh.  She is unable to take Ibuprofen but is taking Tylenol 2 tablets every 4 hours as needed for pain. Encouraged her to use ice compress directly to affected site and to elevate left leg when sitting. No complaints of bleeding or swelling.  Reviewed post procedural instructions with Pamela [name] and reminded her of March 5 post laser ablation duplex and appointment (follow up) with Dr. Hart Rochester since Dr. Arbie Cookey is out of town.    @SIGNATURE @

## 2011-08-20 DIAGNOSIS — R5381 Other malaise: Secondary | ICD-10-CM | POA: Diagnosis not present

## 2011-09-16 DIAGNOSIS — C44711 Basal cell carcinoma of skin of unspecified lower limb, including hip: Secondary | ICD-10-CM | POA: Diagnosis not present

## 2011-09-23 HISTORY — PX: OTHER SURGICAL HISTORY: SHX169

## 2011-09-29 ENCOUNTER — Other Ambulatory Visit: Payer: Self-pay | Admitting: *Deleted

## 2011-09-29 DIAGNOSIS — I83893 Varicose veins of bilateral lower extremities with other complications: Secondary | ICD-10-CM

## 2011-10-04 ENCOUNTER — Encounter: Payer: Self-pay | Admitting: Vascular Surgery

## 2011-10-05 ENCOUNTER — Encounter: Payer: Self-pay | Admitting: Vascular Surgery

## 2011-10-05 ENCOUNTER — Ambulatory Visit (INDEPENDENT_AMBULATORY_CARE_PROVIDER_SITE_OTHER): Payer: Medicare Other | Admitting: Vascular Surgery

## 2011-10-05 ENCOUNTER — Encounter (INDEPENDENT_AMBULATORY_CARE_PROVIDER_SITE_OTHER): Payer: Medicare Other | Admitting: *Deleted

## 2011-10-05 VITALS — BP 152/58 | HR 53 | Resp 18 | Ht 70.0 in | Wt 161.9 lb

## 2011-10-05 DIAGNOSIS — Z48812 Encounter for surgical aftercare following surgery on the circulatory system: Secondary | ICD-10-CM

## 2011-10-05 DIAGNOSIS — I83893 Varicose veins of bilateral lower extremities with other complications: Secondary | ICD-10-CM

## 2011-10-05 NOTE — Progress Notes (Signed)
Patient presents day for followup of her left great saphenous vein laser ablation on 07/29/2011. On her one-week postoperative study she did have evidence of mobile thrombus at the saphenofemoral junction extending into but not occluding her left great saphenous vein. She has been on his Xarelto for anticoagulation since this duplex. Patient reports that she has not completely recovered from a standpoint of the preoperative discomfort with aching and swelling in her left calf. She is still very compliant with compression garments as well.  Physical exam: She does have some chronic hemosiderin deposits around her left ankle but no significant swelling and no varicosities  Vascular lab: She underwent repeat venous duplex and that shows complete resolution of thrombus extending from the saphenofemoral junction. She does have closure of her great saphenous vein  Impression and plan successful ablation of left great saphenous vein for superficial venous hypertension. I feel it is safe for her to stop her anticoagulation as of today since this shows no evidence of persistent difficulty. 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

## 2011-10-07 DIAGNOSIS — C44711 Basal cell carcinoma of skin of unspecified lower limb, including hip: Secondary | ICD-10-CM | POA: Diagnosis not present

## 2011-10-12 NOTE — Procedures (Unsigned)
DUPLEX DEEP VENOUS EXAM - LOWER EXTREMITY  INDICATION:  Left greater saphenous ablation 07/29/2011  HISTORY:  Edema:  Yes Trauma/Surgery: Pain:  No PE:  No Previous DVT:  Yes, mobile thrombus at the saphenofemoral junction after ablation found 08/03/2011 by duplex Anticoagulants:  Yes, Xarelto Other:  Status post left greater saphenous vein ablation with mobile thrombus at saphenofemoral junction  DUPLEX EXAM:               CFV   SFV   PopV  PTV    GSV               R  L  R  L  R  L  R   L  R  L Thrombosis       0     0     0      0     Closed * Spontaneous      +     +     +      + Phasic           +     +     +      + Augmentation     +     +     +      + Compressible     +     +     +      + Competent        +     0     0      +  Legend:  + - yes  o - no  p - partial  D - decreased  IMPRESSION: 1. The mobile thrombus of the left saphenofemoral junction appears to     have resolved.  Slow flow is visualized in common femoral vein. 2. *The proximal left greater saphenous vein is open, however, it     appears closed distally with slight flow visualized in the distal     segment of the greater saphenous vein in the distal thigh.   _____________________________ Larina Earthly, M.D.  SS/MEDQ  D:  10/05/2011  T:  10/05/2011  Job:  [id number]

## 2011-12-14 DIAGNOSIS — T148 Other injury of unspecified body region: Secondary | ICD-10-CM | POA: Diagnosis not present

## 2011-12-14 DIAGNOSIS — W57XXXA Bitten or stung by nonvenomous insect and other nonvenomous arthropods, initial encounter: Secondary | ICD-10-CM | POA: Diagnosis not present

## 2011-12-14 DIAGNOSIS — J189 Pneumonia, unspecified organism: Secondary | ICD-10-CM | POA: Diagnosis not present

## 2012-01-11 ENCOUNTER — Other Ambulatory Visit (HOSPITAL_BASED_OUTPATIENT_CLINIC_OR_DEPARTMENT_OTHER): Payer: Medicare Other | Admitting: Lab

## 2012-01-11 DIAGNOSIS — D0590 Unspecified type of carcinoma in situ of unspecified breast: Secondary | ICD-10-CM | POA: Diagnosis not present

## 2012-01-11 DIAGNOSIS — C50419 Malignant neoplasm of upper-outer quadrant of unspecified female breast: Secondary | ICD-10-CM | POA: Diagnosis not present

## 2012-01-11 LAB — CBC WITH DIFFERENTIAL/PLATELET
BASO%: 0.6 % (ref 0.0–2.0)
EOS%: 5.1 % (ref 0.0–7.0)
MCH: 33.1 pg (ref 25.1–34.0)
MCHC: 33.6 g/dL (ref 31.5–36.0)
MONO%: 6.8 % (ref 0.0–14.0)
RDW: 13.3 % (ref 11.2–14.5)
lymph#: 1.8 10*3/uL (ref 0.9–3.3)

## 2012-01-12 ENCOUNTER — Encounter: Payer: Self-pay | Admitting: *Deleted

## 2012-01-12 LAB — COMPREHENSIVE METABOLIC PANEL
ALT: <8 U/L (ref 0–35)
AST: 12 U/L (ref 0–37)
Albumin: 3.6 g/dL (ref 3.5–5.2)
Alkaline Phosphatase: 59 U/L (ref 39–117)
Calcium: 9 mg/dL (ref 8.4–10.5)
Chloride: 105 mEq/L (ref 96–112)
Creatinine, Ser: 0.75 mg/dL (ref 0.50–1.10)
Potassium: 4.2 mEq/L (ref 3.5–5.3)

## 2012-01-12 NOTE — Progress Notes (Unsigned)
Notified pt of vitamin D deficit. 1000U vitamin D daily is recommended

## 2012-01-17 ENCOUNTER — Encounter: Payer: Self-pay | Admitting: *Deleted

## 2012-01-18 ENCOUNTER — Ambulatory Visit: Payer: Medicare Other | Admitting: Family

## 2012-01-19 ENCOUNTER — Telehealth: Payer: Self-pay | Admitting: *Deleted

## 2012-01-19 ENCOUNTER — Encounter: Payer: Self-pay | Admitting: Family

## 2012-01-19 ENCOUNTER — Ambulatory Visit (HOSPITAL_BASED_OUTPATIENT_CLINIC_OR_DEPARTMENT_OTHER): Payer: Medicare Other | Admitting: Family

## 2012-01-19 VITALS — BP 127/66 | HR 56 | Temp 97.8°F | Resp 20 | Ht 70.0 in | Wt 163.0 lb

## 2012-01-19 DIAGNOSIS — Z780 Asymptomatic menopausal state: Secondary | ICD-10-CM

## 2012-01-19 DIAGNOSIS — Z86718 Personal history of other venous thrombosis and embolism: Secondary | ICD-10-CM

## 2012-01-19 DIAGNOSIS — Z853 Personal history of malignant neoplasm of breast: Secondary | ICD-10-CM | POA: Diagnosis not present

## 2012-01-19 NOTE — Patient Instructions (Addendum)
1. Return in 1 year to see Dr. Donnie Coffin. 2. Mammogram as scheduled. 3. Resume calcium and Vitamin D.  4. Bone density. 5. Prescription for right breast prosthesis and bras.

## 2012-01-19 NOTE — Progress Notes (Signed)
[facility]  Name: Pamela [name]                  DATE: 01/19/2012 MRN: [id number]                      DOB: August 06, 1932  REFERRING PHYSICIAN: Marylen Ponto, MD  DIAGNOSIS: Patient Active Problem List   Diagnosis Date Noted  . History of breast cancer 01/19/2012  . Varicose veins of lower extremities with other complications 06/15/2011  . Leg swelling 02/25/2011     Encounter Diagnosis  Name Primary?  . History of breast cancer Yes    PREVIOUS THERAPY: Right lumpectomy with radiation completed 12/16/2005.   CURRENT THERAPY: Surveillance.   INTERIM HISTORY: Has had an eventful healthcare year. Feb. 28 had vein ablation and subsequently developed DVT, left groin. Took Xarelto for 6 months, now discontinued. Recently had basal cell removed from right leg. Just completed antibiotics for Lyme's disease 2 weeks ago. Feels well, all things considered. No self detected breast problems or complaints. No headache or blurred vision. No cough or shortness of breath. No abdominal pain or new bone pain. Bowel and bladder function are normal. Appetite is good, with adequate fluid intake. Remainder of the 10 point  review of systems is negative.  PHYSICAL EXAM: BP 127/66  Pulse 56  Temp 97.8 F (36.6 C)  Resp 20  Ht 5\' 10"  (1.778 m)  Wt 163 lb (73.936 kg)  BMI 23.39 kg/m2 General: Well developed, well nourished, in no acute distress.  EENT: No ocular or oral lesions. No stomatitis.  Respiratory: Lungs are clear to auscultation bilaterally with normal respiratory movement and no accessory muscle use. Cardiac: No murmur, rub or tachycardia. No upper or lower extremity edema.  GI: Abdomen is soft, no palpable hepatosplenomegaly. No fluid wave. No tenderness. Musculoskeletal: No kyphosis, no tenderness over the spine, ribs or hips. Lymph: No cervical, infraclavicular, axillary or inguinal adenopathy. Neuro: No focal neurological deficits. Psych: Alert and oriented X 3, appropriate  mood and affect.  BREAST EXAM: In the supine position, with the right arm over the head, the right nipple is everted. Circumareolar scar is contracted and nipple protrudes and points downward. No periareolar edema or nipple discharge. No mass in any quadrant or subareolar region. No redness of the skin. No right axillary adenopathy. With the left arm over the head, the left nipple is everted. No periareolar edema or nipple discharge. No mass in any quadrant or subareolar region. No redness of the skin. No left axillary adenopathy. Right volume is less than left, owing to generous lumpectomy and radiation.    LABORATORY STUDIES:   Results for orders placed in visit on 01/11/12  CBC WITH DIFFERENTIAL      Component Value Range   WBC 4.8  3.9 - 10.3 10e3/uL   NEUT# 2.4  1.5 - 6.5 10e3/uL   HGB 12.4  11.6 - 15.9 g/dL   HCT 13.0  86.5 - 78.4 %   Platelets 144 (*) 145 - 400 10e3/uL   MCV 98.5  79.5 - 101.0 fL   MCH 33.1  25.1 - 34.0 pg   MCHC 33.6  31.5 - 36.0 g/dL   RBC 6.96  2.95 - 2.84 10e6/uL   RDW 13.3  11.2 - 14.5 %   lymph# 1.8  0.9 - 3.3 10e3/uL   MONO# 0.3  0.1 - 0.9 10e3/uL   Eosinophils Absolute 0.2  0.0 - 0.5 10e3/uL   Basophils  Absolute 0.0  0.0 - 0.1 10e3/uL   NEUT% 50.7  38.4 - 76.8 %   LYMPH% 36.8  14.0 - 49.7 %   MONO% 6.8  0.0 - 14.0 %   EOS% 5.1  0.0 - 7.0 %   BASO% 0.6  0.0 - 2.0 %  COMPREHENSIVE METABOLIC PANEL      Component Value Range   Sodium 140  135 - 145 mEq/L   Potassium 4.2  3.5 - 5.3 mEq/L   Chloride 105  96 - 112 mEq/L   CO2 29  19 - 32 mEq/L   Glucose, Bld 100 (*) 70 - 99 mg/dL   BUN 17  6 - 23 mg/dL   Creatinine, Ser 5.73  0.50 - 1.10 mg/dL   Total Bilirubin 0.4  0.3 - 1.2 mg/dL   Alkaline Phosphatase 59  39 - 117 U/L   AST 12  0 - 37 U/L   ALT <8  0 - 35 U/L   Total Protein 6.2  6.0 - 8.3 g/dL   Albumin 3.6  3.5 - 5.2 g/dL   Calcium 9.0  8.4 - 22.0 mg/dL  VITAMIN D 25 HYDROXY      Component Value Range   Vit D, 25-Hydroxy 26 (*) 30 - 89 ng/mL      IMPRESSION:   1. History DCIS right breast, no evidence of recurrence clinically or mammographically.  2. Last mammo 07/19/2011. Last bone density 07/2009.  3. Asymmetrical breast volume, owing to generous right lumpectomy and radiation.   PLAN:   1. Return in 1 year to see Dr. Donnie Coffin with lab prior.  2. Mammo in Feb. 2014.  3. Resume calcium and Vitamin D.  4. Bone density. 5. Prescription for right breast prosthesis and bras.

## 2012-01-19 NOTE — Telephone Encounter (Signed)
Gave patient appointment for 01-25-2012 bone density at breast center gave patient appointment for 01-16-2013 starting at 9:30am

## 2012-01-25 ENCOUNTER — Ambulatory Visit
Admission: RE | Admit: 2012-01-25 | Discharge: 2012-01-25 | Disposition: A | Discharge status: Home / Self Care | Payer: Medicare Other | Source: Ambulatory Visit | Attending: Family | Admitting: Family

## 2012-01-25 DIAGNOSIS — M899 Disorder of bone, unspecified: Secondary | ICD-10-CM | POA: Diagnosis not present

## 2012-01-25 DIAGNOSIS — Z780 Asymptomatic menopausal state: Secondary | ICD-10-CM | POA: Diagnosis not present

## 2012-01-25 DIAGNOSIS — M949 Disorder of cartilage, unspecified: Secondary | ICD-10-CM | POA: Diagnosis not present

## 2012-02-02 ENCOUNTER — Encounter: Payer: Self-pay | Admitting: *Deleted

## 2012-02-02 ENCOUNTER — Telehealth: Payer: Self-pay | Admitting: *Deleted

## 2012-02-02 NOTE — Telephone Encounter (Signed)
Per NR have notified pt to take calcium 600mg  twice daily in divided doses. And to increase vita D 800 iu in divided doses

## 2012-02-11 DIAGNOSIS — J069 Acute upper respiratory infection, unspecified: Secondary | ICD-10-CM | POA: Diagnosis not present

## 2012-03-20 DIAGNOSIS — Z23 Encounter for immunization: Secondary | ICD-10-CM | POA: Diagnosis not present

## 2012-03-28 DIAGNOSIS — L719 Rosacea, unspecified: Secondary | ICD-10-CM | POA: Diagnosis not present

## 2012-03-31 DIAGNOSIS — R5383 Other fatigue: Secondary | ICD-10-CM | POA: Diagnosis not present

## 2012-03-31 DIAGNOSIS — R5381 Other malaise: Secondary | ICD-10-CM | POA: Diagnosis not present

## 2012-03-31 DIAGNOSIS — R05 Cough: Secondary | ICD-10-CM | POA: Diagnosis not present

## 2012-03-31 DIAGNOSIS — IMO0001 Reserved for inherently not codable concepts without codable children: Secondary | ICD-10-CM | POA: Diagnosis not present

## 2012-03-31 DIAGNOSIS — R599 Enlarged lymph nodes, unspecified: Secondary | ICD-10-CM | POA: Diagnosis not present

## 2012-04-14 DIAGNOSIS — R05 Cough: Secondary | ICD-10-CM | POA: Diagnosis not present

## 2012-04-20 ENCOUNTER — Telehealth: Payer: Self-pay

## 2012-04-20 ENCOUNTER — Encounter: Payer: Self-pay | Admitting: Neurosurgery

## 2012-04-20 ENCOUNTER — Ambulatory Visit (INDEPENDENT_AMBULATORY_CARE_PROVIDER_SITE_OTHER): Payer: Medicare Other | Admitting: Vascular Surgery

## 2012-04-20 ENCOUNTER — Ambulatory Visit (INDEPENDENT_AMBULATORY_CARE_PROVIDER_SITE_OTHER): Payer: Medicare Other | Admitting: Neurosurgery

## 2012-04-20 VITALS — BP 118/57 | HR 66 | Resp 16 | Ht 68.75 in | Wt 163.1 lb

## 2012-04-20 DIAGNOSIS — M7989 Other specified soft tissue disorders: Secondary | ICD-10-CM

## 2012-04-20 DIAGNOSIS — M79609 Pain in unspecified limb: Secondary | ICD-10-CM | POA: Insufficient documentation

## 2012-04-20 DIAGNOSIS — L539 Erythematous condition, unspecified: Secondary | ICD-10-CM | POA: Insufficient documentation

## 2012-04-20 DIAGNOSIS — I83893 Varicose veins of bilateral lower extremities with other complications: Secondary | ICD-10-CM | POA: Diagnosis not present

## 2012-04-20 MED ORDER — CEPHALEXIN 500 MG PO CAPS: 500.0000 mg | ORAL_CAPSULE | Freq: Four times a day (QID) | ORAL | Status: DC

## 2012-04-20 NOTE — Telephone Encounter (Signed)
PC from pt.  C/o onset (L) lower leg burning and stinging last evening.  States she noticed her left lower leg fiery red and painful from ankle to calf area, with some swelling.  States the swelling is a little better today, but the redness and soreness continues.  Denies any open sores.  Discussed w/ Dr. Arbie Cookey.  Recommends pt. have a venous duplex and see a provider today.  Notified pt.; appt. Given for 2:30 PM for vascular study, and 3:00 PM for eval. per nurse practitioner.  Agrees w/ plan.

## 2012-04-20 NOTE — Progress Notes (Signed)
Subjective:     Patient ID: Pamela [name], female   DOB: 1932/08/17, 78 y.o.   MRN: [id number]  HPI: 78 year old female patient last seen by Dr. Arbie Cookey in May status post laser ablation of her left great saphenous vein in February 2013. The patient called the office today asking from home stating that yesterday her left leg started "burning and stinging" without any incident. We brought the patient in for evaluation.   Review of Systems: 12 point review of systems is notable for the difficulties described above otherwise     Objective:   Physical Exam: Afebrile, vital signs are stable, the patient's left lower leg from the ankle to about midshin is very red there is no weeping there is no open wounds. She has about 1+ pitting edema around her ankle and she states this is better than it was yesterday. The patient reports no kind of insect bite or other incident she states a sudden onset of symptoms yesterday.     Assessment:     Dr. Darrick Penna exam the patient as well as I, he feels like there may be some venous insufficiency and possible reflux causing an inflammatory response and has recommended the patient continue her compression hoses as well as antibiotics.    Plan:     Keflex x10 days 4 times a day, return in 2 weeks to followup with Dr. Arbie Cookey per Dr. Darrick Penna, the patient's questions were encouraged and answered, she is in agreement with this plan.  Lauree Chandler ANP  Clinic M.D.: [place]

## 2012-04-20 NOTE — Progress Notes (Signed)
Left lower extremity venous duplex performed @ [facility] 04/20/2012

## 2012-04-25 DIAGNOSIS — R059 Cough, unspecified: Secondary | ICD-10-CM | POA: Diagnosis not present

## 2012-04-25 DIAGNOSIS — R918 Other nonspecific abnormal finding of lung field: Secondary | ICD-10-CM | POA: Diagnosis not present

## 2012-04-25 DIAGNOSIS — R05 Cough: Secondary | ICD-10-CM | POA: Diagnosis not present

## 2012-05-08 ENCOUNTER — Encounter: Payer: Self-pay | Admitting: Vascular Surgery

## 2012-05-09 ENCOUNTER — Encounter: Payer: Self-pay | Admitting: Vascular Surgery

## 2012-05-09 ENCOUNTER — Ambulatory Visit (INDEPENDENT_AMBULATORY_CARE_PROVIDER_SITE_OTHER): Payer: Medicare Other | Admitting: Vascular Surgery

## 2012-05-09 VITALS — BP 162/76 | HR 62 | Resp 16 | Ht 69.0 in | Wt 164.0 lb

## 2012-05-09 DIAGNOSIS — M79609 Pain in unspecified limb: Secondary | ICD-10-CM

## 2012-05-09 DIAGNOSIS — I83893 Varicose veins of bilateral lower extremities with other complications: Secondary | ICD-10-CM | POA: Diagnosis not present

## 2012-05-09 NOTE — Progress Notes (Signed)
The patient has today for continued followup of venous hypertension in her left leg. She had an episode 1 month ago with a sudden onset of a burning sensation in her medial ankle. This was over an area of chronic hemosiderin deposits. She had immediate inflammatory response under this area. She was seen in our office and was treated with a short course of oral antibiotic in case this was a bacterial. She has had continued resolution and that the discomfort has resolved completely. She has been married plan with her compression garments. Duplex at her visit one month ago showed good ablation of her great saphenous vein the left from just below the knee to just at the saphenofemoral junction. There was a perforator at the area of the discomfort.  SonoSite today reveals some edema in the fluid over this area in her medial ankle. She does have a perforator just above the malleolus. On imaging her saphenous vein it is ablated from just below the knee proximally.  Impression and plan: I suspect that she did have a rupture of a small vein at this area causing her to have sudden pain and erythema associated with this. I explained that unlikely for this to be infectious but I did respond to a short course of antibiotics. She is comfortable with continued observation. I did explain the significance of her perforator in that this may or may not be causing some of her discomfort. I explained we would reserve attempted treatment of her perforator 4 recurrent symptoms and she has now resolved and his become asymptomatic in. She will continue her compression garments and will see [place] on an as-needed basis

## 2012-06-07 ENCOUNTER — Other Ambulatory Visit: Payer: Self-pay | Admitting: Oncology

## 2012-06-07 DIAGNOSIS — Z9889 Other specified postprocedural states: Secondary | ICD-10-CM

## 2012-06-07 DIAGNOSIS — Z853 Personal history of malignant neoplasm of breast: Secondary | ICD-10-CM

## 2012-06-14 DIAGNOSIS — M25519 Pain in unspecified shoulder: Secondary | ICD-10-CM | POA: Diagnosis not present

## 2012-07-19 ENCOUNTER — Ambulatory Visit
Admission: RE | Admit: 2012-07-19 | Discharge: 2012-07-19 | Disposition: A | Discharge status: Home / Self Care | Payer: Medicare Other | Source: Ambulatory Visit | Attending: Oncology | Admitting: Oncology

## 2012-07-19 DIAGNOSIS — Z9889 Other specified postprocedural states: Secondary | ICD-10-CM

## 2012-08-19 ENCOUNTER — Encounter: Payer: Self-pay | Admitting: *Deleted

## 2012-08-19 ENCOUNTER — Telehealth: Payer: Self-pay | Admitting: *Deleted

## 2012-08-19 NOTE — Telephone Encounter (Signed)
Pt notified of new provider and appointment. Pt agreed with new provider and appt. A new appointment schedule will be mailed.

## 2012-09-19 DIAGNOSIS — E782 Mixed hyperlipidemia: Secondary | ICD-10-CM | POA: Diagnosis not present

## 2012-09-19 DIAGNOSIS — E039 Hypothyroidism, unspecified: Secondary | ICD-10-CM | POA: Diagnosis not present

## 2012-09-19 DIAGNOSIS — Z79899 Other long term (current) drug therapy: Secondary | ICD-10-CM | POA: Diagnosis not present

## 2013-01-15 ENCOUNTER — Other Ambulatory Visit: Payer: Self-pay | Admitting: Family

## 2013-01-15 DIAGNOSIS — Z8639 Personal history of other endocrine, nutritional and metabolic disease: Secondary | ICD-10-CM

## 2013-01-15 DIAGNOSIS — M8580 Other specified disorders of bone density and structure, unspecified site: Secondary | ICD-10-CM

## 2013-01-15 DIAGNOSIS — Z853 Personal history of malignant neoplasm of breast: Secondary | ICD-10-CM

## 2013-01-16 ENCOUNTER — Ambulatory Visit: Payer: Medicare Other | Admitting: Oncology

## 2013-01-16 ENCOUNTER — Ambulatory Visit (HOSPITAL_BASED_OUTPATIENT_CLINIC_OR_DEPARTMENT_OTHER): Payer: Medicare Other | Admitting: Family

## 2013-01-16 ENCOUNTER — Encounter: Payer: Self-pay | Admitting: Family

## 2013-01-16 ENCOUNTER — Other Ambulatory Visit (HOSPITAL_BASED_OUTPATIENT_CLINIC_OR_DEPARTMENT_OTHER): Payer: Medicare Other | Admitting: Lab

## 2013-01-16 VITALS — BP 146/72 | HR 53 | Temp 96.4°F | Resp 18 | Ht 69.0 in | Wt 164.4 lb

## 2013-01-16 DIAGNOSIS — Z853 Personal history of malignant neoplasm of breast: Secondary | ICD-10-CM | POA: Diagnosis not present

## 2013-01-16 DIAGNOSIS — M899 Disorder of bone, unspecified: Secondary | ICD-10-CM | POA: Diagnosis not present

## 2013-01-16 DIAGNOSIS — M8580 Other specified disorders of bone density and structure, unspecified site: Secondary | ICD-10-CM

## 2013-01-16 DIAGNOSIS — Z86718 Personal history of other venous thrombosis and embolism: Secondary | ICD-10-CM | POA: Diagnosis not present

## 2013-01-16 DIAGNOSIS — Z8639 Personal history of other endocrine, nutritional and metabolic disease: Secondary | ICD-10-CM | POA: Diagnosis not present

## 2013-01-16 LAB — CBC WITH DIFFERENTIAL/PLATELET
BASO%: 0.9 % (ref 0.0–2.0)
EOS%: 2.6 % (ref 0.0–7.0)
MCH: 34 pg (ref 25.1–34.0)
MCHC: 34.7 g/dL (ref 31.5–36.0)
RDW: 13 % (ref 11.2–14.5)
lymph#: 1.6 10*3/uL (ref 0.9–3.3)

## 2013-01-16 LAB — COMPREHENSIVE METABOLIC PANEL (CC13)
ALT: 11 U/L (ref 0–55)
AST: 14 U/L (ref 5–34)
Albumin: 3.2 g/dL — ABNORMAL LOW (ref 3.5–5.0)
Calcium: 9.1 mg/dL (ref 8.4–10.4)
Chloride: 108 mEq/L (ref 98–109)
Potassium: 4.2 mEq/L (ref 3.5–5.1)

## 2013-01-16 NOTE — Progress Notes (Addendum)
[facility]  [phone] [phone]  OFFICE PROGRESS NOTE   ID: Pamela [name]   DOB: [birth year]  MR#: [id number]  JYN#:[id number]   PCP: Marylen Ponto, MD SU: Sheppard Plumber.  Earlene Plater, M.D. RAD ONC: Jackelyn Knife, M.D.   HISTORY OF PRESENT ILLNESS: From Dr. Theron Arista Rubin's new patient evaluation note dated 09/26/2005: "This is a pleasant 79 year old woman referred by Dr. Earlene Plater for evaluation of DCIS.  Pamela [name] lives in [place].  She is here today with her husband Pamela [name].  She undergoes routine screening mammography on an annual basis.  A routine mammogram performed on June 25, 2005, showed a new area of microcalcifications.  Additional views were recommended and right breast mammogram on 07/07/2005 confirmed the presence of microcalcifications and biopsy was recommended.  Needle core biopsy on 07/15/2005 showed DCIS.  This is ER positive 77 % and PR negative.  The patient underwent needle-localized lumpectomy on 08/09/2005, which showed a 1 cm focus of DCIS.  Seven lymph nodes were removed, all of which were negative for malignancy.  DCIS was felt to be of high grade.  Pamela [name] has had an unremarkable postoperative course."  Her subsequent history is as detailed below.   INTERVAL HISTORY: Dr. Darnelle Catalan and I saw Pamela [name] today for followup of DCIS of right breast.  The patient was last seen by Dr. Donnie Coffin on 01/19/2012.  Since her last office visit, the patient has been doing relatively well.  She is establishing herself with Dr. Darrall Dears service today.  REVIEW OF SYSTEMS: A 10 point review of systems was completed and is negative.  The patient denies any symptomatology or complaints.   PAST MEDICAL HISTORY: Past Medical History  Diagnosis Date  . Leg pain   . Thyroid disease   . Cancer     Right breast  . Phlebitis   . Peripheral vascular disease   . Arthritis   . Vertigo   . DVT (deep venous thrombosis)     PAST SURGICAL  HISTORY: Past Surgical History  Procedure Laterality Date  . Breast lumpectomy  2007    Right lumpectomy with radiation  . Tonsillectomy    . Hemorrhoid surgery    . Debridement tennis elbow      Right elbow   . Cystoscopy      For lesion of the urethra  . Endovenous ablation saphenous vein w/ laser  07-29-2011    left greater saphenous vein  . Excision of basal cell carcinoma  09-23-2011    right calf  . Thyroid ablation  1980    FAMILY HISTORY Family History  Problem Relation Age of Onset  . Cancer Sister     breast cancer, vocal cord and parthyroid cancer  She has a sister and maternal aunt and a cousin all of whom have had breast cancer, all diagnosed in late 79s.   GYNECOLOGIC HISTORY: She is gravida 3, para 3, menses at age 60, age of parity 78, menopausal at age 62.  She used Vagifem for three years.  She used birth control pills for many years starting in 46.  She used hormone replacement therapy until 1999 with both Provera and Premarin.  Of note, she did have two episodes of phlebitis while on hormonal replacement.  SOCIAL HISTORY: Mr. and Mrs. Buell have been married since 93.  Her husband Pamela [name] retired from [employer] in 1999.  She is a retired Airline pilot and retired in 1994.  They have 3 adult children 2 daughters  and one son, all of whom reside in [place].  They have 10 grandchildren and 8 great-grandchildren.  In her spare time the patient enjoys golfing, reading, gardening and volunteering at her church - [employer] [place]   ADVANCED DIRECTIVES: Not in place  HEALTH MAINTENANCE: History  Substance Use Topics  . Smoking status: Never Smoker   . Smokeless tobacco: Never Used  . Alcohol Use: Yes     Comment: Occasional glass of wine    Colonoscopy: Not on file PAP: 08/10/2010 Bone density: The patient's last bone density scan on 02/01/2012 showed a T score of -2.3 (osteopenia). Lipid panel: Not on  file  Allergies  Allergen Reactions  . Demerol   . Ibuprofen     Current Outpatient Prescriptions  Medication Sig Dispense Refill  . acetaminophen (TYLENOL) 325 MG tablet Take 325 mg by mouth every 6 (six) hours as needed for pain.      Marland Kitchen albuterol (PROVENTIL HFA;VENTOLIN HFA) 108 (90 BASE) MCG/ACT inhaler Inhale 2 puffs into the lungs every 4 (four) hours as needed for wheezing.      . fluticasone (FLONASE) 50 MCG/ACT nasal spray Place 2 sprays into the nose as needed.       Marland Kitchen levothyroxine (SYNTHROID, LEVOTHROID) 88 MCG tablet Take 88 mcg by mouth daily.        . meclizine (ANTIVERT) 25 MG tablet Take 25 mg by mouth daily.      . polyethylene glycol (MIRALAX / GLYCOLAX) packet Take 17 g by mouth as needed.       No current facility-administered medications for this visit.    OBJECTIVE: Filed Vitals:   01/16/13 1003  BP: 146/72  Pulse: 53  Temp: 96.4 F (35.8 C)  Resp: 18     Body mass index is 24.27 kg/(m^2).  Oxygen saturation 97% on room air     ECOG FS: 0 - Asymptomatic  General appearance: Alert, cooperative, well nourished, no apparent distress Head: Normocephalic, without obvious abnormality, atraumatic Eyes: Arcus senilis, PERRLA, EOMI Nose: Nares, septum and mucosa are normal, no drainage or sinus tenderness Neck: No adenopathy, supple, symmetrical, trachea midline, no tenderness Resp: Clear to auscultation bilaterally, diminished bibasilar breath sounds Cardio: Bradycardia, no murmur, click, rub or gallop Breasts: Right breast has well-healed surgical scars, right breast has surgical architectural and radiation changes noted, a firm medial and inframammary ridges, glandular breast tissue bilaterally, no nipple inversion, no axilla fullness GI: Soft, distended, non-tender, hypoactive bowel sounds, no organomegaly Extremities: Extremities normal, atraumatic, no cyanosis or edema Lymph nodes: Cervical, supraclavicular, and axillary nodes normal Neurologic: Grossly  normal   LAB RESULTS: Lab Results  Component Value Date   WBC 5.1 01/16/2013   NEUTROABS 2.9 01/16/2013   HGB 13.0 01/16/2013   HCT 37.5 01/16/2013   MCV 98.0 01/16/2013   PLT 179 01/16/2013      Chemistry      Component Value Date/Time   NA 143 01/16/2013 0922   NA 140 01/11/2012 1422   K 4.2 01/16/2013 0922   K 4.2 01/11/2012 1422   CL 105 01/11/2012 1422   CO2 26 01/16/2013 0922   CO2 29 01/11/2012 1422   BUN 16.8 01/16/2013 0922   BUN 17 01/11/2012 1422   CREATININE 0.8 01/16/2013 0922   CREATININE 0.75 01/11/2012 1422      Component Value Date/Time   CALCIUM 9.1 01/16/2013 0922   CALCIUM 9.0 01/11/2012 1422   ALKPHOS 61 01/16/2013 0922   ALKPHOS 59  01/11/2012 1422   AST 14 01/16/2013 0922   AST 12 01/11/2012 1422   ALT 11 01/16/2013 0922   ALT <8 01/11/2012 1422   BILITOT 0.42 01/16/2013 0922   BILITOT 0.4 01/11/2012 1422       Lab Results  Component Value Date   LABCA2 31 12/24/2008    Urinalysis No results found for this basename: colorurine,  appearanceur,  labspec,  phurine,  glucoseu,  hgbur,  bilirubinur,  ketonesur,  proteinur,  urobilinogen,  nitrite,  leukocytesur    STUDIES: 1.  Mm Digital Diagnostic Bilat 07/19/2012   *RADIOLOGY REPORT*  Clinical Data:  Personal history of right breast cancer status post lumpectomy 2007  DIGITAL DIAGNOSTIC BILATERAL MAMMOGRAM WITH CAD  Comparison: July 19, 2011, July 15, 2010, July 14, 2009, July 11, 2008  Findings:  ACR Breast Density Category scattered fibroglandular tissue  CC and MLO views of bilateral breasts, spot tangential view of right breast are submitted.  Postsurgical changes are identified within the right breast.  No suspicious abnormality is identified bilaterally.  Mammographic images were processed with CAD.  IMPRESSION: Benign findings.  RECOMMENDATION: Routine screening mammogram in 1 year.  I have discussed the findings and recommendations with the patient. Results were also provided in writing at the  conclusion of the visit.  BI-RADS CATEGORY 2:  Benign finding(s).   Original Report Authenticated By: Sherian Rein, M.D.   2.The patient's last bone density scan on 02/01/2012 showed a T score of -2.3 (osteopenia).     ASSESSMENT: Pamela [name] is a  79 y.o. [place], [place] woman: 1.  The patient had a right breast needle core biopsy in the upper outer quadrant on 07/15/2005 which showed ductal carcinoma in situ, complex with a marked associated inflammatory response.  2.  The patient had bilateral breast MRI on 07/27/2005 which showed within the 12 o'clock position of the right breast there was an area of asymmetric enhancement measuring 2.2 x 1.2 x 2.1 cm. This area was larger by MRI than it appears on the mammogram.  No enlarged lymph nodes were identified. Images of the left breast are unremarkable.  Within the anterior portion of the left hepatic lobe there was a well-defined high T2 signal intensity lesion which measures 1.9 cm in diameter.  This likely represented a cyst but further evaluation with ultrasound or CT could be helpful in excluding a liver metastasis.  3.  Status post right breast needle localized lumpectomy with right axillary lymph node biopsy on 08/09/2005 for a stage 0, pTis, pN0, pMX, 1.0 cm high-grade ductal carcinoma in situ margins not involved, estrogen receptor 77% positive, progesterone receptor negative, with 0/7 metastatic right axillary lymph nodes.    4.  The patient had radiation therapy from 10/27/2005 through 12/15/2005.    5.  The patient elected not to take adjuvant antiestrogen therapy.  6.  The patient underwent genetic counseling and testing and per letter dated 03/07/2007 BRACAnalysis showed the patient was negative for BRCA1 and BRCA2 breast cancer genes.  7.  Osteopenia  8.  Bradycardia (asymptomatic)   PLAN: The patient is over 7 years from her time of diagnosis and will officially become a graduate of [facility]'s breast cancer program  today. The patient's husband Pamela [name] was present for her graduation ceremony. We asked that she continue annual clinical breast examinations by a physician in addition to annual mammography.  Her last mammogram results are listed above.  The patient was encouraged to take vitamin D3 1000 IUs by mouth  daily in addition to daily exercise (walking as tolerated) for osteopenia.  Pamela [name] states that she does not take calcium because it constipates her.  Ms.[name] was strongly encouraged to discuss bradycardia (with her heart rate ranging from 49-53 during today's office visit) with her primary care physician Dr. Leonor Liv.  All questions were answered.  The patient was encouraged to contact us with any problems, questions or concerns.   Larina Bras, NP-C 01/16/2013, 7:18 PM  ADDENDUM: I met today with this 79 year old BRCA negative [place] woman to go over her diagnosis, treatment history and prognosis with her. In brief:  She underwent right lumpectomy and axillary lymph node dissection March of 2007 for a high-grade ductal carcinoma in situ measuring 1 cm, estrogen receptor positive, progesterone receptor negative, with a total of 7 lymph nodes removed from the right axilla. She completed adjuvant radiation therapy in July of 2007 and opted against antiestrogen therapy.  She really understands that her tumor was not life-threatening, since it was not invasive. She had adequate local treatment and her risk of recurrence is less than 10% at this point. Accordingly I am comfortable releasing her to her primary care physician. All she will need in terms of breast cancer followup is yearly mammography and yearly physician breast exam.    Pamela [name] understands we will be glad to see her again in the future if the need arises, but as of nowNo further routine appointments are being made for her here.  I personally saw this patient and performed a substantive portion of this encounter with  the listed APP documented above.   Lowella Dell, MD

## 2013-01-16 NOTE — Patient Instructions (Addendum)
Please contact us at [phone] if you have any questions or concerns.  Please continue to do well and enjoy life!!!  Get plenty of rest, drink plenty of water, exercise daily (walking), eat a balanced diet.  Take vitamin D3 1000 IUs daily.   Complete monthly self-breast examinations.  Have a clinical breast exam by a physician every year.  Have your mammogram completed every year.  Results for orders placed in visit on 01/16/13 (from the past 24 hour(s))  CBC WITH DIFFERENTIAL     Status: None   Collection Time    01/16/13  9:21 AM      Result Value Range   WBC 5.1  3.9 - 10.3 10e3/uL   NEUT# 2.9  1.5 - 6.5 10e3/uL   HGB 13.0  11.6 - 15.9 g/dL   HCT 16.1  09.6 - 04.5 %   Platelets 179  145 - 400 10e3/uL   MCV 98.0  79.5 - 101.0 fL   MCH 34.0  25.1 - 34.0 pg   MCHC 34.7  31.5 - 36.0 g/dL   RBC 4.09  8.11 - 9.14 10e6/uL   RDW 13.0  11.2 - 14.5 %   lymph# 1.6  0.9 - 3.3 10e3/uL   MONO# 0.4  0.1 - 0.9 10e3/uL   Eosinophils Absolute 0.1  0.0 - 0.5 10e3/uL   Basophils Absolute 0.0  0.0 - 0.1 10e3/uL   NEUT% 56.8  38.4 - 76.8 %   LYMPH% 31.8  14.0 - 49.7 %   MONO% 7.9  0.0 - 14.0 %   EOS% 2.6  0.0 - 7.0 %   BASO% 0.9  0.0 - 2.0 %   Narrative:    Performed At:  [facility]               [address]               [place], [place] [address]  COMPREHENSIVE METABOLIC PANEL (CC13)     Status: Abnormal   Collection Time    01/16/13  9:22 AM      Result Value Range   Sodium 143  136 - 145 mEq/L   Potassium 4.2  3.5 - 5.1 mEq/L   Chloride 108  98 - 109 mEq/L   CO2 26  22 - 29 mEq/L   Glucose 77  70 - 140 mg/dl   BUN 62.1  7.0 - 30.8 mg/dL   Creatinine 0.8  0.6 - 1.1 mg/dL   Total Bilirubin 6.57  0.20 - 1.20 mg/dL   Alkaline Phosphatase 61  40 - 150 U/L   AST 14  5 - 34 U/L   ALT 11  0 - 55 U/L   Total Protein 6.8  6.4 - 8.3 g/dL   Albumin 3.2 (*) 3.5 - 5.0 g/dL   Calcium 9.1  8.4 - 84.6 mg/dL   Narrative:    Note: New Reference ranges.Performed At:  [facility]               [address]               [place], [place] [address]  LACTATE DEHYDROGENASE (CC13)     Status: None   Collection Time    01/16/13  9:22 AM      Result Value Range   LDH 217  125 - 245 U/L   Narrative:    Performed At:  [facility]  [address]               [place], [place] [address]

## 2013-01-22 ENCOUNTER — Telehealth: Payer: Self-pay

## 2013-01-22 DIAGNOSIS — L259 Unspecified contact dermatitis, unspecified cause: Secondary | ICD-10-CM | POA: Diagnosis not present

## 2013-01-22 NOTE — Telephone Encounter (Signed)
LMOVM regarding lab results. Per JH, Vit D low. Advised to take Vit D3 1000 IUs daily. Also to eat more protein. All other labs ok. Call office with any questions. TMB

## 2013-01-23 ENCOUNTER — Telehealth: Payer: Self-pay | Admitting: *Deleted

## 2013-01-23 NOTE — Telephone Encounter (Signed)
Patient called regarding a message she received yesterday. I have forwarded the message to [facility]. JMW

## 2013-01-24 ENCOUNTER — Telehealth: Payer: Self-pay | Admitting: *Deleted

## 2013-01-24 NOTE — Telephone Encounter (Signed)
Patient has called back and left message to call her regarding a telephone call she had received. I have forwarded the message to [facility].   JMW

## 2013-01-24 NOTE — Telephone Encounter (Signed)
Spoke with pt regarding lab results for 8/19. Sent pt a list of high protein foods. Pt voiced understanding and knows to call the office with any questions. TMB

## 2013-03-20 DIAGNOSIS — E039 Hypothyroidism, unspecified: Secondary | ICD-10-CM | POA: Diagnosis not present

## 2013-04-05 ENCOUNTER — Other Ambulatory Visit: Payer: Self-pay

## 2013-04-24 DIAGNOSIS — L719 Rosacea, unspecified: Secondary | ICD-10-CM | POA: Diagnosis not present

## 2013-04-24 DIAGNOSIS — L821 Other seborrheic keratosis: Secondary | ICD-10-CM | POA: Diagnosis not present

## 2013-05-04 DIAGNOSIS — Z0000 Encounter for general adult medical examination without abnormal findings: Secondary | ICD-10-CM | POA: Diagnosis not present

## 2013-05-10 DIAGNOSIS — Z1211 Encounter for screening for malignant neoplasm of colon: Secondary | ICD-10-CM | POA: Diagnosis not present

## 2013-06-01 DIAGNOSIS — B9689 Other specified bacterial agents as the cause of diseases classified elsewhere: Secondary | ICD-10-CM | POA: Diagnosis not present

## 2013-06-01 DIAGNOSIS — J209 Acute bronchitis, unspecified: Secondary | ICD-10-CM | POA: Diagnosis not present

## 2013-06-01 DIAGNOSIS — A499 Bacterial infection, unspecified: Secondary | ICD-10-CM | POA: Diagnosis not present

## 2013-06-07 DIAGNOSIS — J40 Bronchitis, not specified as acute or chronic: Secondary | ICD-10-CM | POA: Diagnosis not present

## 2013-06-14 ENCOUNTER — Other Ambulatory Visit: Payer: Self-pay

## 2013-06-14 DIAGNOSIS — Z1231 Encounter for screening mammogram for malignant neoplasm of breast: Secondary | ICD-10-CM

## 2013-06-14 DIAGNOSIS — Z853 Personal history of malignant neoplasm of breast: Secondary | ICD-10-CM

## 2013-06-21 DIAGNOSIS — Z1211 Encounter for screening for malignant neoplasm of colon: Secondary | ICD-10-CM | POA: Diagnosis not present

## 2013-06-29 DIAGNOSIS — R195 Other fecal abnormalities: Secondary | ICD-10-CM | POA: Diagnosis not present

## 2013-07-19 DIAGNOSIS — N3000 Acute cystitis without hematuria: Secondary | ICD-10-CM | POA: Diagnosis not present

## 2013-07-27 ENCOUNTER — Ambulatory Visit: Payer: Medicare Other

## 2013-08-10 ENCOUNTER — Ambulatory Visit
Admission: RE | Admit: 2013-08-10 | Discharge: 2013-08-10 | Disposition: A | Discharge status: Home / Self Care | Payer: Medicare Other | Source: Ambulatory Visit

## 2013-08-10 DIAGNOSIS — Z853 Personal history of malignant neoplasm of breast: Secondary | ICD-10-CM

## 2013-08-10 DIAGNOSIS — Z1231 Encounter for screening mammogram for malignant neoplasm of breast: Secondary | ICD-10-CM | POA: Diagnosis not present

## 2013-08-22 DIAGNOSIS — L719 Rosacea, unspecified: Secondary | ICD-10-CM | POA: Diagnosis not present

## 2013-09-20 ENCOUNTER — Other Ambulatory Visit: Payer: Self-pay | Admitting: Gastroenterology

## 2013-09-20 DIAGNOSIS — R197 Diarrhea, unspecified: Secondary | ICD-10-CM | POA: Diagnosis not present

## 2013-09-20 DIAGNOSIS — D649 Anemia, unspecified: Secondary | ICD-10-CM | POA: Diagnosis not present

## 2013-09-20 DIAGNOSIS — K644 Residual hemorrhoidal skin tags: Secondary | ICD-10-CM | POA: Diagnosis not present

## 2013-09-20 DIAGNOSIS — R195 Other fecal abnormalities: Secondary | ICD-10-CM | POA: Diagnosis not present

## 2013-09-20 DIAGNOSIS — K5730 Diverticulosis of large intestine without perforation or abscess without bleeding: Secondary | ICD-10-CM | POA: Diagnosis not present

## 2013-09-20 DIAGNOSIS — R1084 Generalized abdominal pain: Secondary | ICD-10-CM | POA: Diagnosis not present

## 2013-09-20 DIAGNOSIS — D126 Benign neoplasm of colon, unspecified: Secondary | ICD-10-CM | POA: Diagnosis not present

## 2013-11-02 DIAGNOSIS — K5900 Constipation, unspecified: Secondary | ICD-10-CM | POA: Diagnosis not present

## 2013-11-02 DIAGNOSIS — R1013 Epigastric pain: Secondary | ICD-10-CM | POA: Diagnosis not present

## 2013-12-11 DIAGNOSIS — L255 Unspecified contact dermatitis due to plants, except food: Secondary | ICD-10-CM | POA: Diagnosis not present

## 2013-12-19 DIAGNOSIS — L255 Unspecified contact dermatitis due to plants, except food: Secondary | ICD-10-CM | POA: Diagnosis not present

## 2013-12-24 DIAGNOSIS — K5900 Constipation, unspecified: Secondary | ICD-10-CM | POA: Diagnosis not present

## 2014-01-21 DIAGNOSIS — E039 Hypothyroidism, unspecified: Secondary | ICD-10-CM | POA: Diagnosis not present

## 2014-02-14 DIAGNOSIS — D0470 Carcinoma in situ of skin of unspecified lower limb, including hip: Secondary | ICD-10-CM | POA: Diagnosis not present

## 2014-02-14 DIAGNOSIS — L821 Other seborrheic keratosis: Secondary | ICD-10-CM | POA: Diagnosis not present

## 2014-03-14 DIAGNOSIS — G245 Blepharospasm: Secondary | ICD-10-CM | POA: Diagnosis not present

## 2014-03-14 DIAGNOSIS — H43811 Vitreous degeneration, right eye: Secondary | ICD-10-CM | POA: Diagnosis not present

## 2014-03-28 DIAGNOSIS — M5412 Radiculopathy, cervical region: Secondary | ICD-10-CM | POA: Diagnosis not present

## 2014-04-16 DIAGNOSIS — M5412 Radiculopathy, cervical region: Secondary | ICD-10-CM | POA: Diagnosis not present

## 2014-04-16 DIAGNOSIS — H547 Unspecified visual loss: Secondary | ICD-10-CM | POA: Diagnosis not present

## 2014-04-17 DIAGNOSIS — G43109 Migraine with aura, not intractable, without status migrainosus: Secondary | ICD-10-CM | POA: Diagnosis not present

## 2014-05-02 DIAGNOSIS — F418 Other specified anxiety disorders: Secondary | ICD-10-CM | POA: Diagnosis not present

## 2014-05-02 DIAGNOSIS — J00 Acute nasopharyngitis [common cold]: Secondary | ICD-10-CM | POA: Diagnosis not present

## 2014-05-07 DIAGNOSIS — M5412 Radiculopathy, cervical region: Secondary | ICD-10-CM | POA: Diagnosis not present

## 2014-05-07 DIAGNOSIS — M47812 Spondylosis without myelopathy or radiculopathy, cervical region: Secondary | ICD-10-CM | POA: Diagnosis not present

## 2014-05-08 DIAGNOSIS — H547 Unspecified visual loss: Secondary | ICD-10-CM | POA: Diagnosis not present

## 2014-05-08 DIAGNOSIS — Z8673 Personal history of transient ischemic attack (TIA), and cerebral infarction without residual deficits: Secondary | ICD-10-CM | POA: Diagnosis not present

## 2014-05-08 DIAGNOSIS — M542 Cervicalgia: Secondary | ICD-10-CM | POA: Diagnosis not present

## 2014-05-14 DIAGNOSIS — M542 Cervicalgia: Secondary | ICD-10-CM | POA: Diagnosis not present

## 2014-07-05 ENCOUNTER — Other Ambulatory Visit: Payer: Self-pay

## 2014-07-05 DIAGNOSIS — Z1231 Encounter for screening mammogram for malignant neoplasm of breast: Secondary | ICD-10-CM

## 2014-07-08 ENCOUNTER — Other Ambulatory Visit: Payer: Self-pay | Admitting: Family Medicine

## 2014-07-08 DIAGNOSIS — M8580 Other specified disorders of bone density and structure, unspecified site: Secondary | ICD-10-CM

## 2014-08-15 ENCOUNTER — Ambulatory Visit
Admission: RE | Admit: 2014-08-15 | Discharge: 2014-08-15 | Disposition: A | Discharge status: Home / Self Care | Payer: Medicare Other | Source: Ambulatory Visit

## 2014-08-15 ENCOUNTER — Ambulatory Visit
Admission: RE | Admit: 2014-08-15 | Discharge: 2014-08-15 | Disposition: A | Discharge status: Home / Self Care | Payer: Medicare Other | Source: Ambulatory Visit | Attending: Family Medicine | Admitting: Family Medicine

## 2014-08-15 DIAGNOSIS — M8580 Other specified disorders of bone density and structure, unspecified site: Secondary | ICD-10-CM

## 2014-08-15 DIAGNOSIS — M85852 Other specified disorders of bone density and structure, left thigh: Secondary | ICD-10-CM | POA: Diagnosis not present

## 2014-08-15 DIAGNOSIS — M8588 Other specified disorders of bone density and structure, other site: Secondary | ICD-10-CM | POA: Diagnosis not present

## 2014-08-15 DIAGNOSIS — Z1231 Encounter for screening mammogram for malignant neoplasm of breast: Secondary | ICD-10-CM

## 2014-08-15 DIAGNOSIS — Z780 Asymptomatic menopausal state: Secondary | ICD-10-CM | POA: Diagnosis not present

## 2014-09-11 DIAGNOSIS — M25474 Effusion, right foot: Secondary | ICD-10-CM | POA: Diagnosis not present

## 2014-09-25 ENCOUNTER — Encounter: Payer: Self-pay | Admitting: Vascular Surgery

## 2014-09-26 ENCOUNTER — Other Ambulatory Visit: Payer: Self-pay

## 2014-09-26 DIAGNOSIS — R609 Edema, unspecified: Secondary | ICD-10-CM

## 2014-10-04 ENCOUNTER — Encounter: Payer: Self-pay | Admitting: Vascular Surgery

## 2014-10-08 ENCOUNTER — Encounter: Payer: Self-pay | Admitting: Vascular Surgery

## 2014-10-08 ENCOUNTER — Ambulatory Visit (HOSPITAL_COMMUNITY)
Admission: RE | Admit: 2014-10-08 | Discharge: 2014-10-08 | Disposition: A | Discharge status: Home / Self Care | Payer: Medicare Other | Source: Ambulatory Visit | Attending: Vascular Surgery | Admitting: Vascular Surgery

## 2014-10-08 ENCOUNTER — Ambulatory Visit (INDEPENDENT_AMBULATORY_CARE_PROVIDER_SITE_OTHER): Payer: Medicare Other | Admitting: Vascular Surgery

## 2014-10-08 VITALS — BP 172/80 | HR 55 | Resp 18 | Ht 67.5 in | Wt 160.3 lb

## 2014-10-08 DIAGNOSIS — R609 Edema, unspecified: Secondary | ICD-10-CM | POA: Insufficient documentation

## 2014-10-08 DIAGNOSIS — M7989 Other specified soft tissue disorders: Secondary | ICD-10-CM | POA: Diagnosis not present

## 2014-10-08 NOTE — Progress Notes (Signed)
Filed Vitals:   10/08/14 1300 10/08/14 1303  BP: 153/77 172/80  Pulse: 57 55  Resp: 18   Height: 5' 7.5" (1.715 m)   Weight: 160 lb 4.8 oz (72.712 kg)   Body mass index is 24.72 kg/(m^2).

## 2014-10-08 NOTE — Progress Notes (Signed)
The patient reports today for discussion regarding right foot swelling. She reports this began spontaneously several months ago. She does not have any injury or any episode that caused this. She reports she is very compliant and bilateral thigh-high 15-20 mmHg compression. She reports that she even attempted 20/30 on the right and continued to have some swelling in her right foot. This has resolved to some degree but the does have persistent swelling. She reports this is not in her calf is in her foot only. She is known to me from prior treatment of left great saphenous vein reflux with laser ablation in 2013. She did well with that his had no difficulty on the left side.  Past Medical History  Diagnosis Date  . Leg pain   . Phlebitis   . Peripheral vascular disease   . Vertigo   . Arthritis     Osteo-athritis  . Thyroid disease     Hypo-  . Allergy     Rhinitis  . DVT (deep venous thrombosis)     Thrombophlebitis Left  . Cancer     Right breast  . Cancer     BCC -  skin  Right Cal    History  Substance Use Topics  . Smoking status: Never Smoker   . Smokeless tobacco: Never Used  . Alcohol Use: No     Comment: Occasional glass of wine    Family History  Problem Relation Age of Onset  . Cancer Sister     breast cancer, vocal cord and parthyroid cancer    Allergies  Allergen Reactions  . Cephalosporins     Cefdinir  ( per Dr. Gara Kroner. [place] )  . Demerol   . Ibuprofen      Current outpatient prescriptions:  .  acetaminophen (TYLENOL) 325 MG tablet, Take 325 mg by mouth every 6 (six) hours as needed for pain., Disp: , Rfl:  .  albuterol (PROVENTIL HFA;VENTOLIN HFA) 108 (90 BASE) MCG/ACT inhaler, Inhale 2 puffs into the lungs every 4 (four) hours as needed for wheezing., Disp: , Rfl:  .  alendronate (FOSAMAX) 35 MG tablet, Take 35 mg by mouth every 7 (seven) days. Take with a full glass of water on an empty stomach., Disp: , Rfl:  .  levothyroxine (SYNTHROID, LEVOTHROID)  88 MCG tablet, Take 88 mcg by mouth daily.  , Disp: , Rfl:  .  meclizine (ANTIVERT) 25 MG tablet, Take 25 mg by mouth daily., Disp: , Rfl:  .  polyethylene glycol (MIRALAX / GLYCOLAX) packet, Take 17 g by mouth as needed., Disp: , Rfl:  .  diazepam (VALIUM) 5 MG tablet, Take 5 mg by mouth as needed for anxiety., Disp: , Rfl:  .  fluticasone (FLONASE) 50 MCG/ACT nasal spray, Place 2 sprays into the nose as needed. , Disp: , Rfl:   Filed Vitals:   10/08/14 1300 10/08/14 1303  BP: 153/77 172/80  Pulse: 57 55  Resp: 18   Height: 5' 7.5" (1.715 m)   Weight: 160 lb 4.8 oz (72.712 kg)     Body mass index is 24.72 kg/(m^2).       On physical exam: Well-developed well-nourished female no acute distress Respirations or nonlabored Neurologically she is grossly intact She does have some swelling in her right foot versus her left. She has chronic changes of venous hypertension with hemosiderin deposit in the left ankle on the medial aspect but none on the right. She does not have any varicosities. Plus dorsalis pedis  pulses bilaterally  She did under go noninvasive venous duplex on her right leg. This showed some reflux in her right common femoral vein only. No reflux in her deep or superficial system otherwise.  I did image her left saphenous vein with SonoSite ultrasound dystocia closure from the distal thigh proximally.  Impression and plan the specific right foot swelling with no clear etiology. She does not have any physical findings suggestive of lymphedema. She does not have any significant venous hypertension in her superficial or deep system. I reassured her with this. Explained I do not have it etiology for this but do not have any treatment option. I explained to her that I do not think there is any danger associated with this. She will continue her walking program and see [place] on as-needed basis

## 2014-10-18 DIAGNOSIS — Z79899 Other long term (current) drug therapy: Secondary | ICD-10-CM | POA: Diagnosis not present

## 2014-10-18 DIAGNOSIS — E039 Hypothyroidism, unspecified: Secondary | ICD-10-CM | POA: Diagnosis not present

## 2014-11-02 DIAGNOSIS — L638 Other alopecia areata: Secondary | ICD-10-CM | POA: Diagnosis not present

## 2014-11-02 DIAGNOSIS — L814 Other melanin hyperpigmentation: Secondary | ICD-10-CM | POA: Diagnosis not present

## 2014-11-28 DIAGNOSIS — M25562 Pain in left knee: Secondary | ICD-10-CM

## 2014-11-28 DIAGNOSIS — M25569 Pain in unspecified knee: Secondary | ICD-10-CM | POA: Insufficient documentation

## 2014-11-28 DIAGNOSIS — M1711 Unilateral primary osteoarthritis, right knee: Secondary | ICD-10-CM | POA: Diagnosis not present

## 2014-11-28 DIAGNOSIS — M25561 Pain in right knee: Secondary | ICD-10-CM | POA: Diagnosis not present

## 2014-11-28 DIAGNOSIS — M1712 Unilateral primary osteoarthritis, left knee: Secondary | ICD-10-CM | POA: Diagnosis not present

## 2014-12-16 DIAGNOSIS — M1712 Unilateral primary osteoarthritis, left knee: Secondary | ICD-10-CM | POA: Diagnosis not present

## 2014-12-16 DIAGNOSIS — M1711 Unilateral primary osteoarthritis, right knee: Secondary | ICD-10-CM | POA: Insufficient documentation

## 2015-01-07 DIAGNOSIS — K5790 Diverticulosis of intestine, part unspecified, without perforation or abscess without bleeding: Secondary | ICD-10-CM | POA: Diagnosis not present

## 2015-01-21 DIAGNOSIS — R109 Unspecified abdominal pain: Secondary | ICD-10-CM | POA: Diagnosis not present

## 2015-01-21 DIAGNOSIS — Z1389 Encounter for screening for other disorder: Secondary | ICD-10-CM | POA: Diagnosis not present

## 2015-01-21 DIAGNOSIS — Z0000 Encounter for general adult medical examination without abnormal findings: Secondary | ICD-10-CM | POA: Diagnosis not present

## 2015-01-21 DIAGNOSIS — R110 Nausea: Secondary | ICD-10-CM | POA: Diagnosis not present

## 2015-01-21 DIAGNOSIS — B370 Candidal stomatitis: Secondary | ICD-10-CM | POA: Diagnosis not present

## 2015-02-05 DIAGNOSIS — R109 Unspecified abdominal pain: Secondary | ICD-10-CM | POA: Diagnosis not present

## 2015-02-05 DIAGNOSIS — K7689 Other specified diseases of liver: Secondary | ICD-10-CM | POA: Diagnosis not present

## 2015-02-05 DIAGNOSIS — N281 Cyst of kidney, acquired: Secondary | ICD-10-CM | POA: Diagnosis not present

## 2015-02-07 ENCOUNTER — Encounter: Payer: Self-pay | Admitting: Genetic Counselor

## 2015-02-11 DIAGNOSIS — K5900 Constipation, unspecified: Secondary | ICD-10-CM | POA: Diagnosis not present

## 2015-02-11 DIAGNOSIS — R1032 Left lower quadrant pain: Secondary | ICD-10-CM | POA: Diagnosis not present

## 2015-02-11 DIAGNOSIS — R634 Abnormal weight loss: Secondary | ICD-10-CM | POA: Diagnosis not present

## 2015-03-03 ENCOUNTER — Telehealth: Payer: Self-pay | Admitting: Genetic Counselor

## 2015-03-03 DIAGNOSIS — M1711 Unilateral primary osteoarthritis, right knee: Secondary | ICD-10-CM | POA: Diagnosis not present

## 2015-03-03 NOTE — Telephone Encounter (Signed)
patient called to schedule genetic f/u appt for 10/19 @10  w/Karen Florene Glen.

## 2015-03-17 ENCOUNTER — Ambulatory Visit (INDEPENDENT_AMBULATORY_CARE_PROVIDER_SITE_OTHER): Payer: Medicare Other | Admitting: Podiatry

## 2015-03-17 ENCOUNTER — Encounter: Payer: Self-pay | Admitting: Podiatry

## 2015-03-17 VITALS — BP 147/77 | HR 67 | Resp 14

## 2015-03-17 DIAGNOSIS — M79672 Pain in left foot: Secondary | ICD-10-CM

## 2015-03-17 DIAGNOSIS — G5793 Unspecified mononeuropathy of bilateral lower limbs: Secondary | ICD-10-CM

## 2015-03-17 DIAGNOSIS — M79671 Pain in right foot: Secondary | ICD-10-CM | POA: Diagnosis not present

## 2015-03-17 NOTE — Progress Notes (Addendum)
   Subjective:    Patient ID: Pamela [name], female    DOB: 1932/06/11, 81 y.o.   MRN: [id number]  HPI Patient presents here today with B/L feet numbness since 2 years ago and is gradually getting worse within the last year.  This patient presents to the office saying that she is experiencing numbness in both her feet.  She says the numbmess appears to be worsening over time.  She says she occasionally looses control of her left leg and then has balance issues.  She has history of breast cancer and vein surgery previously. Review of Systems  HENT: Positive for hearing loss.   Neurological: Positive for dizziness.       Objective:   Physical Exam GENERAL APPEARANCE: Alert, conversant. Appropriately groomed. No acute distress.  VASCULAR: Pedal pulses palpable at  [facility] and PT bilateral.  Capillary refill time is immediate to all digits,  Normal temperature gradient.  Digital hair growth is present bilateral  NEUROLOGIC: sensation is absent from metatarsals to the toes plantarly.  to 5.07 monofilament.  Light touch is intact absent plantarly from metatarsal head to toes , Muscle strength normal.  There is decreased sensation of monofilament wire to lower legs.   MUSCULOSKELETAL: acceptable muscle strength, tone and stability bilateral.  Intrinsic muscluature intact bilateral.  Rectus appearance of foot and digits noted bilateral.   DERMATOLOGIC: skin color, texture, and turgor are within normal limits.  No preulcerative lesions or ulcers  are seen, no interdigital maceration noted.  No open lesions present.  Digital nails are asymptomatic. No drainage noted.         Assessment & Plan:  Peripheral neuropathy. B/L  IE  Patient was found to have neuropathy distally plantarly.  Told her she could be treated with gabapentin for symptoms.  Told her she could be evaluated by her neurologist to determine cause of neuropathy.  Dispensed powersteps insoles for lower leg muscle control.

## 2015-03-18 ENCOUNTER — Encounter: Payer: Self-pay | Admitting: Genetic Counselor

## 2015-03-18 DIAGNOSIS — Z1379 Encounter for other screening for genetic and chromosomal anomalies: Secondary | ICD-10-CM | POA: Insufficient documentation

## 2015-03-19 ENCOUNTER — Encounter: Payer: Medicare Other | Admitting: Genetic Counselor

## 2015-03-19 ENCOUNTER — Other Ambulatory Visit: Payer: Medicare Other

## 2015-04-02 ENCOUNTER — Ambulatory Visit (HOSPITAL_BASED_OUTPATIENT_CLINIC_OR_DEPARTMENT_OTHER): Payer: Medicare Other | Admitting: Genetic Counselor

## 2015-04-02 ENCOUNTER — Encounter: Payer: Self-pay | Admitting: Genetic Counselor

## 2015-04-02 ENCOUNTER — Other Ambulatory Visit: Payer: Medicare Other

## 2015-04-02 DIAGNOSIS — Z853 Personal history of malignant neoplasm of breast: Secondary | ICD-10-CM

## 2015-04-02 DIAGNOSIS — Z1379 Encounter for other screening for genetic and chromosomal anomalies: Secondary | ICD-10-CM

## 2015-04-02 DIAGNOSIS — Z800 Family history of malignant neoplasm of digestive organs: Secondary | ICD-10-CM | POA: Insufficient documentation

## 2015-04-02 DIAGNOSIS — Z8041 Family history of malignant neoplasm of ovary: Secondary | ICD-10-CM | POA: Diagnosis not present

## 2015-04-02 DIAGNOSIS — Z315 Encounter for genetic counseling: Secondary | ICD-10-CM | POA: Diagnosis not present

## 2015-04-02 DIAGNOSIS — Z803 Family history of malignant neoplasm of breast: Secondary | ICD-10-CM | POA: Diagnosis not present

## 2015-04-02 DIAGNOSIS — Z1630 Resistance to unspecified antimicrobial drugs: Secondary | ICD-10-CM | POA: Diagnosis not present

## 2015-04-02 NOTE — Progress Notes (Signed)
REFERRING PROVIDER: Ronita Hipps, MD [address],[place] [address],  PRIMARY PROVIDER:  Ronita Hipps, MD  PRIMARY REASON FOR VISIT:  1. History of breast cancer   2. Family history of breast cancer   3. Family history of ovarian cancer   4. Family history of colon cancer   5. Genetic testing      HISTORY OF PRESENT ILLNESS:   Ms. Laconte, a 81 y.o. female, was seen for a [facility] cancer genetics consultation at the request of Dr. Helene Kelp due to a personal and family history of cancer.  Ms. Dyk presents to clinic today to discuss the possibility of a hereditary predisposition to cancer, genetic testing, and to further clarify her future cancer risks, as well as potential cancer risks for family members.   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 This was treated with lumpectomy and radiation.  She underwent genetic testing at that time for BRCA mutations through [employer] [facility].  This testing was negative.    CANCER HISTORY:   No history exists.     HORMONAL RISK FACTORS:  Menarche was at age 31.  First live birth at age 46.  OCP use for approximately 20 years.  Ovaries intact: yes.  Hysterectomy: no.  Menopausal status: postmenopausal.  HRT use: 6 years. Colonoscopy: yes; normal. Mammogram within the last year: yes. Number of breast biopsies: 1. Up to date with pelvic exams:  yes. Any excessive radiation exposure in the past:  For breast cancer treatment  Past Medical History  Diagnosis Date  . Leg pain   . Phlebitis   . Peripheral vascular disease ([place])   . Vertigo   . Arthritis     Osteo-athritis  . Thyroid disease     Hypo-  . Allergy     Rhinitis  . DVT (deep venous thrombosis) ([facility])     Thrombophlebitis Left  . Cancer ([place])     Right breast  . Cancer ([place])     BCC -  skin  Right Cal  . Family history of breast cancer   . Family history of ovarian cancer   . Family history of colon cancer      Past Surgical History  Procedure Laterality Date  . Breast lumpectomy  2007    Right lumpectomy with radiation  . Tonsillectomy      and Adenoids   . Hemorrhoid surgery    . Debridement tennis elbow      Right elbow   . Cystoscopy      For lesion of the urethra  . Endovenous ablation saphenous vein w/ laser  07-29-2011    left greater saphenous vein  . Excision of basal cell carcinoma  09-23-2011    right calf  . Thyroid ablation  1980  . Toe arthroplasty Right     5th Toe -  Hammer Toe    Social History   Social History  . Marital Status: Married    Spouse Name: N/A  . Number of Children: 3  . Years of Education: N/A   Social History Main Topics  . Smoking status: Never Smoker   . Smokeless tobacco: Never Used  . Alcohol Use: No     Comment: Occasional glass of wine  . Drug Use: No  . Sexual Activity: Not Currently    Birth Control/ Protection: Post-menopausal   Other Topics Concern  . None   Social History Narrative  FAMILY HISTORY:  We obtained a detailed, 4-generation family history.  Significant diagnoses are listed below: Family History  Problem Relation Age of Onset  . Cancer Sister     breast cancer, vocal cord and parthyroid cancer  . Ovarian cancer Maternal Aunt 60  . Colon cancer Maternal Uncle   . Cancer Paternal Aunt     NOS  . Colon cancer Paternal Uncle     dx in his 61s  . Breast cancer Maternal Aunt 80  . Cancer Maternal Uncle     NOS  . Breast cancer Cousin 31    maternal first cousin  . Cancer Cousin     cancer in her arm   The patient has three children, a brother and a sister.  Her sister was diagnosed with breast cancer at 42, and vocal cord cancer at 71.  She is a smoker.  Her mother died at 25 from a lung problem and her father died of a heart attack at 36.  Her mother had three sisters and four brothers.  One sister had breast cancer and her daughter had breast cancer at 80.  Another sister had ovarian cancer, one  brother had colon cancer and another brother had a non specified cancer.  Ms. Severs's father had four sisters and four brothers.  One sister had an unspecified cancer and one brother had colon cancer in his 70s.  Patient's ancestors are of Caucasian descent. There is no reported Ashkenazi Jewish ancestry. There is no known consanguinity.  GENETIC COUNSELING ASSESSMENT: SELDA JALBERT is a 81 y.o. female with a personal and family history of breast cancer which somewhat suggestive of a hereditary breast cancer syndrome and predisposition to cancer. We, therefore, discussed and recommended the following at today's visit.   DISCUSSION: We discussed that about 5-10% of breast cancer is hereditary.  Most are related to BRCA mutations.  Ms. Briski was tested for BRCA mutations in the past, and was negative, but BART was not performed.  We discussed this updated testing.  Additionally, other genes could be implicated based on the cancer in the family including CHEK2 and Lynch syndrome genes.  We reviewed the characteristics, features and inheritance patterns of hereditary cancer syndromes. We also discussed genetic testing, including the appropriate family members to test, the process of testing, insurance coverage and turn-around-time for results. We discussed the implications of a negative, positive and/or variant of uncertain significant result. We recommended Ms. Stefanik pursue genetic testing for the Comprehensive cancer and MSH2 inv gene panel. The Comprehensive Cancer Panel offered by [facility] includes sequencing and/or deletion duplication testing of the following 32 genes: APC, ATM, AXIN2, BARD1, BMPR1A, BRCA1, BRCA2, BRIP1, CDH1, CDK4, CDKN2A, CHEK2, EPCAM, FANCC, MLH1, MSH2, MSH6, MUTYH, NBN, PALB2, PMS2, POLD1, POLE, PTEN, RAD51C, RAD51D, SCG5/GREM1, SMAD4, STK11, TP53, VHL, and XRCC2.     Based on Ms. Villa's personal and family history of cancer, she meets medical criteria for genetic  testing. Despite that she meets criteria, she may still have an out of pocket cost. We discussed that if her out of pocket cost for testing is over $100, the laboratory will call and confirm whether she wants to proceed with testing.  If the out of pocket cost of testing is less than $100 she will be billed by the genetic testing laboratory.   PLAN: After considering the risks, benefits, and limitations, Ms. Mayse  provided informed consent to pursue genetic testing and the blood sample was sent to [facility] for analysis  of the Comprehensive Cancer panel. Results should be available within approximately 2-3 weeks' time, at which point they will be disclosed by telephone to Ms. Trillo, as will any additional recommendations warranted by these results. Ms. Pair will receive a summary of her genetic counseling visit and a copy of her results once available. This information will also be available in Epic. We encouraged Ms. Narramore to remain in contact with cancer genetics annually so that we can continuously update the family history and inform her of any changes in cancer genetics and testing that may be of benefit for her family. Ms. Lobello questions were answered to her satisfaction today. Our contact information was provided should additional questions or concerns arise.  Lastly, we encouraged Ms. Gillin to remain in contact with cancer genetics annually so that we can continuously update the family history and inform her of any changes in cancer genetics and testing that may be of benefit for this family.   Ms.  Dull questions were answered to her satisfaction today. Our contact information was provided should additional questions or concerns arise. Thank you for the referral and allowing [place] to share in the care of your patient.   Linas Stepter P. Florene Glen, [place], [facility] Certified Genetic Counselor [email or web address] phone: [phone]  The patient was seen for a total of 60  minutes in face-to-face genetic counseling.  This patient was discussed with Drs. Magrinat, Lindi Adie and/or Burr Medico who agrees with the above.    _______________________________________________________________________ For Office Staff:  Number of people involved in session: 2 Was an Intern/ student involved with case: no

## 2015-04-18 ENCOUNTER — Telehealth: Payer: Self-pay | Admitting: Genetic Counselor

## 2015-04-18 NOTE — Telephone Encounter (Signed)
LM on VM with good news on genetic test results.  Left CB instructions, and indicated that I will be in meetings all day.  If we are not able to connect today, I will call her back next week.  Again stated that we had good news.

## 2015-04-22 ENCOUNTER — Telehealth: Payer: Self-pay | Admitting: Genetic Counselor

## 2015-04-22 ENCOUNTER — Ambulatory Visit: Payer: Self-pay | Admitting: Genetic Counselor

## 2015-04-22 DIAGNOSIS — Z8041 Family history of malignant neoplasm of ovary: Secondary | ICD-10-CM

## 2015-04-22 DIAGNOSIS — Z853 Personal history of malignant neoplasm of breast: Secondary | ICD-10-CM

## 2015-04-22 DIAGNOSIS — Z1379 Encounter for other screening for genetic and chromosomal anomalies: Secondary | ICD-10-CM

## 2015-04-22 DIAGNOSIS — Z803 Family history of malignant neoplasm of breast: Secondary | ICD-10-CM

## 2015-04-22 DIAGNOSIS — Z800 Family history of malignant neoplasm of digestive organs: Secondary | ICD-10-CM

## 2015-04-22 NOTE — Progress Notes (Signed)
HPI: Ms. Drahos was previously seen in the [facility] [address] clinic due to a personal history of breast cancer and family history of cancer and concerns regarding a hereditary predisposition to cancer. Please refer to our prior cancer genetics clinic note for more information regarding Ms. Gailey's medical, social and family histories, and our assessment and recommendations, at the time. Ms. Jordahl recent genetic test results were disclosed to her, as were recommendations warranted by these results. These results and recommendations are discussed in more detail below.  FAMILY HISTORY:  We obtained a detailed, 4-generation family history.  Significant diagnoses are listed below: Family History  Problem Relation Age of Onset  . Cancer Sister     breast cancer, vocal cord and parthyroid cancer  . Ovarian cancer Maternal Aunt 60  . Colon cancer Maternal Uncle   . Cancer Paternal Aunt     NOS  . Colon cancer Paternal Uncle     dx in his 60s  . Breast cancer Maternal Aunt 80  . Cancer Maternal Uncle     NOS  . Breast cancer Cousin 8    maternal first cousin  . Cancer Cousin     cancer in her arm    The patient has three children, a brother and a sister. Her sister was diagnosed with breast cancer at 83, and vocal cord cancer at 21. She is a smoker. Her mother died at 69 from a lung problem and her father died of a heart attack at 81. Her mother had three sisters and four brothers. One sister had breast cancer and her daughter had breast cancer at 76. Another sister had ovarian cancer, one brother had colon cancer and another brother had a non specified cancer. Ms. Fulcher's father had four sisters and four brothers. One sister had an unspecified cancer and one brother had colon cancer in his 70s. Patient's ancestors are of Caucasian descent. There is no reported Ashkenazi Jewish ancestry. There is no known consanguinity.  GENETIC TEST RESULTS: At the time of Ms.  Wilcock's visit, we recommended she pursue genetic testing of the Comprehensive Cancer gene panel. The Comprehensive Cancer Panel offered by [facility] includes sequencing and/or deletion duplication testing of the following 32 genes: APC, ATM, AXIN2, BARD1, BMPR1A, BRCA1, BRCA2, BRIP1, CDH1, CDK4, CDKN2A, CHEK2, EPCAM, FANCC, MLH1, MSH2, MSH6, MUTYH, NBN, PALB2, PMS2, POLD1, POLE, PTEN, RAD51C, RAD51D, SCG5/GREM1, SMAD4, STK11, TP53, VHL, and XRCC2.   The report date is April 17, 2015.  Genetic testing was normal, and did not reveal a deleterious mutation in these genes. The test report has been scanned into EPIC and is located under the Molecular Pathology section of the Results Review tab.   We discussed with Ms. Flemmer that since the current genetic testing is not perfect, it is possible there may be a gene mutation in one of these genes that current testing cannot detect, but that chance is small. We also discussed, that it is possible that another gene that has not yet been discovered, or that we have not yet tested, is responsible for the cancer diagnoses in the family, and it is, therefore, important to remain in touch with cancer genetics in the future so that we can continue to offer Ms. Hollinsworth the most up to date genetic testing.   CANCER SCREENING RECOMMENDATIONS: This result is reassuring and indicates that Ms. Lasecki likely does not have an increased risk for a future cancer due to a mutation in one of these genes. This normal test  also suggests that Ms. Gervacio's cancer was most likely not due to an inherited predisposition associated with one of these genes.  Most cancers happen by chance and this negative test suggests that her cancer falls into this category.  We, therefore, recommended she continue to follow the cancer management and screening guidelines provided by her oncology and primary healthcare provider.   RECOMMENDATIONS FOR FAMILY MEMBERS: Women in this family might be at  some increased risk of developing cancer, over the general population risk, simply due to the family history of cancer. We recommended women in this family have a yearly mammogram beginning at age 35, or 44 years younger than the earliest onset of cancer, an an annual clinical breast exam, and perform monthly breast self-exams. Women in this family should also have a gynecological exam as recommended by their primary provider. All family members should have a colonoscopy by age 30.  FOLLOW-UP: Lastly, we discussed with Ms. Paff that cancer genetics is a rapidly advancing field and it is possible that new genetic tests will be appropriate for her and/or her family members in the future. We encouraged her to remain in contact with cancer genetics on an annual basis so we can update her personal and family histories and let her know of advances in cancer genetics that may benefit this family.   Our contact number was provided. Ms. Szafran questions were answered to her satisfaction, and she knows she is welcome to call us at anytime with additional questions or concerns.   Roma Kayser, MS, [facility] Certified Genetic Counselor [email or web address]

## 2015-04-22 NOTE — Telephone Encounter (Signed)
LM on VM with good news.  Let her know I will be out Wed thru Friday.  Left CB instructions.

## 2015-04-22 NOTE — Telephone Encounter (Signed)
Revealed negative genetic testing on the Comprehensive cancer panel.  Previous BRCA testing was also negative.

## 2015-05-19 DIAGNOSIS — R609 Edema, unspecified: Secondary | ICD-10-CM | POA: Diagnosis not present

## 2015-05-19 DIAGNOSIS — E039 Hypothyroidism, unspecified: Secondary | ICD-10-CM | POA: Diagnosis not present

## 2015-05-19 DIAGNOSIS — Z0000 Encounter for general adult medical examination without abnormal findings: Secondary | ICD-10-CM | POA: Diagnosis not present

## 2015-05-19 DIAGNOSIS — R600 Localized edema: Secondary | ICD-10-CM | POA: Diagnosis not present

## 2015-06-05 DIAGNOSIS — M1712 Unilateral primary osteoarthritis, left knee: Secondary | ICD-10-CM | POA: Diagnosis not present

## 2015-06-05 DIAGNOSIS — M1711 Unilateral primary osteoarthritis, right knee: Secondary | ICD-10-CM | POA: Diagnosis not present

## 2015-06-18 DIAGNOSIS — R2681 Unsteadiness on feet: Secondary | ICD-10-CM | POA: Diagnosis not present

## 2015-06-23 DIAGNOSIS — R2681 Unsteadiness on feet: Secondary | ICD-10-CM | POA: Diagnosis not present

## 2015-06-30 DIAGNOSIS — R2681 Unsteadiness on feet: Secondary | ICD-10-CM | POA: Diagnosis not present

## 2015-07-02 DIAGNOSIS — R2681 Unsteadiness on feet: Secondary | ICD-10-CM | POA: Diagnosis not present

## 2015-07-09 DIAGNOSIS — R2681 Unsteadiness on feet: Secondary | ICD-10-CM | POA: Diagnosis not present

## 2015-07-11 ENCOUNTER — Other Ambulatory Visit: Payer: Self-pay

## 2015-07-11 DIAGNOSIS — Z1231 Encounter for screening mammogram for malignant neoplasm of breast: Secondary | ICD-10-CM

## 2015-07-11 DIAGNOSIS — R2681 Unsteadiness on feet: Secondary | ICD-10-CM | POA: Diagnosis not present

## 2015-07-16 DIAGNOSIS — R2681 Unsteadiness on feet: Secondary | ICD-10-CM | POA: Diagnosis not present

## 2015-07-18 DIAGNOSIS — R2681 Unsteadiness on feet: Secondary | ICD-10-CM | POA: Diagnosis not present

## 2015-07-21 DIAGNOSIS — R2681 Unsteadiness on feet: Secondary | ICD-10-CM | POA: Diagnosis not present

## 2015-07-25 DIAGNOSIS — R2681 Unsteadiness on feet: Secondary | ICD-10-CM | POA: Diagnosis not present

## 2015-07-30 DIAGNOSIS — R2681 Unsteadiness on feet: Secondary | ICD-10-CM | POA: Diagnosis not present

## 2015-08-01 DIAGNOSIS — R2681 Unsteadiness on feet: Secondary | ICD-10-CM | POA: Diagnosis not present

## 2015-08-06 DIAGNOSIS — R2681 Unsteadiness on feet: Secondary | ICD-10-CM | POA: Diagnosis not present

## 2015-08-08 DIAGNOSIS — R2681 Unsteadiness on feet: Secondary | ICD-10-CM | POA: Diagnosis not present

## 2015-08-13 DIAGNOSIS — R2681 Unsteadiness on feet: Secondary | ICD-10-CM | POA: Diagnosis not present

## 2015-08-15 DIAGNOSIS — R2681 Unsteadiness on feet: Secondary | ICD-10-CM | POA: Diagnosis not present

## 2015-08-18 ENCOUNTER — Ambulatory Visit
Admission: RE | Admit: 2015-08-18 | Discharge: 2015-08-18 | Disposition: A | Discharge status: Home / Self Care | Payer: Medicare Other | Source: Ambulatory Visit

## 2015-08-18 DIAGNOSIS — Z1231 Encounter for screening mammogram for malignant neoplasm of breast: Secondary | ICD-10-CM

## 2015-08-19 DIAGNOSIS — R2681 Unsteadiness on feet: Secondary | ICD-10-CM | POA: Diagnosis not present

## 2015-08-21 DIAGNOSIS — R2681 Unsteadiness on feet: Secondary | ICD-10-CM | POA: Diagnosis not present

## 2015-08-25 DIAGNOSIS — G8929 Other chronic pain: Secondary | ICD-10-CM | POA: Diagnosis not present

## 2015-08-25 DIAGNOSIS — M25562 Pain in left knee: Secondary | ICD-10-CM | POA: Diagnosis not present

## 2015-08-25 DIAGNOSIS — R2681 Unsteadiness on feet: Secondary | ICD-10-CM | POA: Diagnosis not present

## 2015-08-25 DIAGNOSIS — M25561 Pain in right knee: Secondary | ICD-10-CM | POA: Diagnosis not present

## 2015-08-29 DIAGNOSIS — R2681 Unsteadiness on feet: Secondary | ICD-10-CM | POA: Diagnosis not present

## 2015-09-04 DIAGNOSIS — M1711 Unilateral primary osteoarthritis, right knee: Secondary | ICD-10-CM | POA: Diagnosis not present

## 2015-09-04 DIAGNOSIS — M1712 Unilateral primary osteoarthritis, left knee: Secondary | ICD-10-CM | POA: Diagnosis not present

## 2015-09-24 ENCOUNTER — Ambulatory Visit (INDEPENDENT_AMBULATORY_CARE_PROVIDER_SITE_OTHER): Payer: Medicare Other

## 2015-09-24 ENCOUNTER — Ambulatory Visit (INDEPENDENT_AMBULATORY_CARE_PROVIDER_SITE_OTHER): Payer: Medicare Other | Admitting: Sports Medicine

## 2015-09-24 ENCOUNTER — Encounter: Payer: Self-pay | Admitting: Sports Medicine

## 2015-09-24 DIAGNOSIS — M19079 Primary osteoarthritis, unspecified ankle and foot: Secondary | ICD-10-CM

## 2015-09-24 DIAGNOSIS — M79672 Pain in left foot: Secondary | ICD-10-CM

## 2015-09-24 DIAGNOSIS — M8580 Other specified disorders of bone density and structure, unspecified site: Secondary | ICD-10-CM | POA: Diagnosis not present

## 2015-09-24 DIAGNOSIS — I739 Peripheral vascular disease, unspecified: Secondary | ICD-10-CM

## 2015-09-24 DIAGNOSIS — M792 Neuralgia and neuritis, unspecified: Secondary | ICD-10-CM | POA: Diagnosis not present

## 2015-09-24 MED ORDER — GABAPENTIN 300 MG PO CAPS
300.0000 mg | ORAL_CAPSULE | Freq: Every day | ORAL | Status: DC
Start: 2015-09-24 — End: 2015-12-31

## 2015-09-24 NOTE — Progress Notes (Signed)
Patient ID: Pamela [name], female   DOB: [birth year], 83 y.o.   MRN: [id number] Subjective: Pamela [name] is a 82 y.o. female patient who presents to office for evaluation of  Left great toe pain or about 10 weeks. Patient . Reports that it feels like a needle sticking in her toe hurts with direct pressure, especially at night when the covers her to the area. States that she tried a few days ago, he toe cushion and wider shoes with great improvement in symptoms.  Reports that she thinks her toe was aggravated initially with physical therapy where she was doing a lot of therapy up on her toes for gait and balance issues. Patient denies any other pedal complaints. Denies injury/trip/fall/sprain/any other causative factors.   Patient Active Problem List   Diagnosis Date Noted  . Family history of breast cancer   . Family history of ovarian cancer   . Family history of colon cancer   . Genetic testing 03/18/2015  . Arthritis of knee, degenerative 12/16/2014  . Gonalgia 11/28/2014  . Unspecified erythematous condition 04/20/2012  . Swelling of limb 04/20/2012  . Pain in limb 04/20/2012  . History of breast cancer 01/19/2012  . Varicose veins of lower extremities with other complications 123XX123  . Leg swelling 02/25/2011    Current Outpatient Prescriptions on File Prior to Visit  Medication Sig Dispense Refill  . acetaminophen (TYLENOL) 325 MG tablet Take 325 mg by mouth every 6 (six) hours as needed for pain.    Marland Kitchen albuterol (PROVENTIL HFA;VENTOLIN HFA) 108 (90 BASE) MCG/ACT inhaler Inhale 2 puffs into the lungs every 4 (four) hours as needed for wheezing.    Marland Kitchen alendronate (FOSAMAX) 35 MG tablet Take 35 mg by mouth every 7 (seven) days. Take with a full glass of water on an empty stomach.    . diazepam (VALIUM) 5 MG tablet Take 5 mg by mouth as needed for anxiety.    . fluticasone (FLONASE) 50 MCG/ACT nasal spray Place 2 sprays into the nose as needed.     . Glucosamine-Chondroitin  (COSAMIN DS PO) Take by mouth daily.    Marland Kitchen levothyroxine (SYNTHROID, LEVOTHROID) 88 MCG tablet Take 88 mcg by mouth daily.      . meclizine (ANTIVERT) 25 MG tablet Take 25 mg by mouth daily.    . polyethylene glycol (MIRALAX / GLYCOLAX) packet Take 17 g by mouth as needed.     No current facility-administered medications on file prior to visit.    Allergies  Allergen Reactions  . Cephalosporins     Cefdinir  ( per Dr. Gara Kroner. [place] )  . Demerol   . Ibuprofen Swelling  . Meperidine Nausea Only  . Seasonal Ic [Cholestatin]     Objective:  General: Alert and oriented x3 in no acute distress  Dermatology: No open lesions bilateral lower extremities, no webspace macerations, no ecchymosis bilateral, all nails x 10 are well manicured.  Vascular: Dorsalis Pedis and Posterior Tibial pedal pulses palpable however, faint, Capillary Fill Time  5 seconds,(-) pedal hair growth bilateral,  1+ pitting edema bilateral lower extremities, Temperature gradient mildly decrease with varicosities and dependent rubor that improves once elevation of the left foot experience, especially at the hallux.   Neurology: Gross sensation intact via light touch bilateral, Protective sensation absent with Thornell Mule Monofilament to all pedal sites, vibratory  absent bilateral, Deep tendon reflexes within normal limits bilateral, No babinski sign present bilateral. (- )Tinels sign bilateral.   Musculoskeletal: Mild tenderness  with palpation at Left great toe , with mild exostosis at IPJ likely suggestive of osteoarthritis,No pain with calf compression bilateral.  Pedal and ankle joints within normal limits bilateral. Strength within normal limits in all groups bilateral.   Gait:  Non-Antalgic gait  Xrays  Left Foot   Impression: decrease osseous mineralization. There is significant hammertoe deformity with spurring and joint space narrowing at the hallux interphalangeal joint and first metatarsophalangeal joint.  There is posterior and inferior calcaneal spurs, no acute fracture or significant dislocation soft tissue swelling mild increase around ankle otherwise all other soft tissue margins are within normal limits, no foreign body.   Assessment and Plan: Problem List Items Addressed This Visit      Other   Pain in limb - Primary   Relevant Medications   gabapentin (NEURONTIN) 300 MG capsule   Other Relevant Orders   DG Foot 2 Views Left    Other Visit Diagnoses    Neuritis        Relevant Medications    gabapentin (NEURONTIN) 300 MG capsule    PVD (peripheral vascular disease) ([facility])        Osteoarthritis of ankle and foot, unspecified laterality        Decreased bone density            -Complete examination performed -Xrays reviewed -Discussed treatement options for likely neuritis with secondary osteoarthritis and peripheral vascular disease -Rx gabapentin 300 mg daily at bedtime; Will consider neurology consult, no improvement -Advised patient to resume wearing compression stockings and encouraged elevation of lower extremities to assist with edema control - Recommend good supportive shoes daily and to use toe cushion to prevent excessive pressure to the left hallux -Continue with Celebrex for osteoarthritis which she is on for significant arthritis of her knees -Patient to return to office in 2 months or sooner if condition worsens.  Landis Martins, DPM

## 2015-10-22 ENCOUNTER — Encounter: Payer: Self-pay | Admitting: Sports Medicine

## 2015-10-22 ENCOUNTER — Ambulatory Visit (INDEPENDENT_AMBULATORY_CARE_PROVIDER_SITE_OTHER): Payer: Medicare Other | Admitting: Sports Medicine

## 2015-10-22 DIAGNOSIS — M792 Neuralgia and neuritis, unspecified: Secondary | ICD-10-CM

## 2015-10-22 DIAGNOSIS — I739 Peripheral vascular disease, unspecified: Secondary | ICD-10-CM

## 2015-10-22 DIAGNOSIS — M79672 Pain in left foot: Secondary | ICD-10-CM

## 2015-10-22 DIAGNOSIS — M19079 Primary osteoarthritis, unspecified ankle and foot: Secondary | ICD-10-CM

## 2015-10-22 NOTE — Progress Notes (Signed)
Patient ID: CLARYCE STAREK, female   DOB: 02/04/1933, 82 y.o.   MRN: [id number]  Subjective: DONTAVIA KUZIA is a 82 y.o. female patient who returns to office for evaluation of  Left great toe pain states that tried the Gabapentin for about 4 nights but it made her "too sleepy",  Admits that she still have the pain in her Left big toe at night that is sharp in nature. Denies any other pedal complaints at this time.   Patient Active Problem List   Diagnosis Date Noted  . Family history of breast cancer   . Family history of ovarian cancer   . Family history of colon cancer   . Genetic testing 03/18/2015  . Arthritis of knee, degenerative 12/16/2014  . Gonalgia 11/28/2014  . Unspecified erythematous condition 04/20/2012  . Swelling of limb 04/20/2012  . Pain in limb 04/20/2012  . History of breast cancer 01/19/2012  . Varicose veins of lower extremities with other complications 123XX123  . Leg swelling 02/25/2011    Current Outpatient Prescriptions on File Prior to Visit  Medication Sig Dispense Refill  . acetaminophen (TYLENOL) 325 MG tablet Take 325 mg by mouth every 6 (six) hours as needed for pain.    Marland Kitchen albuterol (PROVENTIL HFA;VENTOLIN HFA) 108 (90 BASE) MCG/ACT inhaler Inhale 2 puffs into the lungs every 4 (four) hours as needed for wheezing.    Marland Kitchen alendronate (FOSAMAX) 35 MG tablet Take 35 mg by mouth every 7 (seven) days. Take with a full glass of water on an empty stomach.    . diazepam (VALIUM) 5 MG tablet Take 5 mg by mouth as needed for anxiety.    . fluticasone (FLONASE) 50 MCG/ACT nasal spray Place 2 sprays into the nose as needed.     . gabapentin (NEURONTIN) 300 MG capsule Take 1 capsule (300 mg total) by mouth at bedtime. 30 capsule 1  . Glucosamine-Chondroitin (COSAMIN DS PO) Take by mouth daily.    Marland Kitchen levothyroxine (SYNTHROID, LEVOTHROID) 88 MCG tablet Take 88 mcg by mouth daily.      . meclizine (ANTIVERT) 25 MG tablet Take 25 mg by mouth daily.    . polyethylene  glycol (MIRALAX / GLYCOLAX) packet Take 17 g by mouth as needed.     No current facility-administered medications on file prior to visit.    Allergies  Allergen Reactions  . Cephalosporins     Cefdinir  ( per Dr. Gara Kroner. [place] )  . Demerol   . Ibuprofen Swelling  . Meperidine Nausea Only  . Seasonal Ic [Cholestatin]     Objective:  General: Alert and oriented x3 in no acute distress  Dermatology: No open lesions bilateral lower extremities, no webspace macerations, no ecchymosis bilateral, all nails x 10 are well manicured.  Vascular: Dorsalis Pedis and Posterior Tibial pedal pulses palpable however, faint, Capillary Fill Time  5 seconds,(-) pedal hair growth bilateral,  1+ pitting edema bilateral lower extremities, Temperature gradient mildly decreased with varicosities and dependent rubor that improves once elevation.   Neurology: Gross sensation intact via light touch bilateral, Protective sensation absent with Thornell Mule Monofilament to all pedal sites, vibratory  absent bilateral, Deep tendon reflexes within normal limits bilateral, No babinski sign present bilateral. (- )Tinels sign bilateral. Subjective sharp/needle pain to left great toe medial aspect.   Musculoskeletal: Mild tenderness with palpation at Left great toe distal medial aspect, mild exostosis at IPJ likely suggestive of osteoarthritis, No pain with calf compression bilateral.  Pedal and ankle  joints within normal limits bilateral. Strength within normal limits in all groups bilateral.   Assessment and Plan: Problem List Items Addressed This Visit      Other   Pain in limb - Primary    Other Visit Diagnoses    Neuritis        PVD (peripheral vascular disease) ([place])        Osteoarthritis of ankle and foot, unspecified laterality           -Complete examination performed -Discussed treatement options for likely neuritis/progressive neuropathy with secondary osteoarthritis and peripheral vascular  disease -Discontinue Gabapentin at this time; may consider resuming at 100mg  at a later point if needed -Rx Topical pain cream from [facility] to use at Left great toe -Cont with compression stockings and encouraged elevation of lower extremities to assist with edema control - Recommend good supportive shoes daily and to use toe cushion to prevent excessive pressure to the left hallux -Continue with Celebrex for osteoarthritis which she is on for significant arthritis of her knees -Patient to return to office in 1 month as scheduled or sooner if condition worsens.  Landis Martins, DPM

## 2015-10-23 ENCOUNTER — Telehealth: Payer: Self-pay | Admitting: *Deleted

## 2015-10-23 MED ORDER — NONFORMULARY OR COMPOUNDED ITEM: Status: DC

## 2015-10-23 NOTE — Telephone Encounter (Addendum)
-----   Message from Landis Martins, [place] sent at 10/22/2015  3:05 PM EDT ----- Regarding: [facility] Topical neuropathy/pain cream to apply daily to left big toe as needed for pain  Thanks Dr. Cannon Kettle. 10/23/2015-Faxed to [employer].  11/03/2015-I explained to pt, I was my overlooking the fax code that her [facility] rx had not been received by [employer], and apologized. Pt states she was [place]-town over the [place] weekend, had only just questioned the delay, and accepted my apology.  I called [facility] and explained my mistake and faxed the rx immediately with insurance information.

## 2015-11-19 DIAGNOSIS — E559 Vitamin D deficiency, unspecified: Secondary | ICD-10-CM | POA: Diagnosis not present

## 2015-11-19 DIAGNOSIS — R634 Abnormal weight loss: Secondary | ICD-10-CM | POA: Diagnosis not present

## 2015-11-19 DIAGNOSIS — R0609 Other forms of dyspnea: Secondary | ICD-10-CM | POA: Diagnosis not present

## 2015-11-19 DIAGNOSIS — R5383 Other fatigue: Secondary | ICD-10-CM | POA: Diagnosis not present

## 2015-11-19 DIAGNOSIS — E039 Hypothyroidism, unspecified: Secondary | ICD-10-CM | POA: Diagnosis not present

## 2015-11-19 DIAGNOSIS — Z79899 Other long term (current) drug therapy: Secondary | ICD-10-CM | POA: Diagnosis not present

## 2015-11-24 DIAGNOSIS — R0609 Other forms of dyspnea: Secondary | ICD-10-CM | POA: Diagnosis not present

## 2015-11-26 ENCOUNTER — Ambulatory Visit (INDEPENDENT_AMBULATORY_CARE_PROVIDER_SITE_OTHER): Payer: Medicare Other | Admitting: Sports Medicine

## 2015-11-26 ENCOUNTER — Encounter: Payer: Self-pay | Admitting: Sports Medicine

## 2015-11-26 DIAGNOSIS — M792 Neuralgia and neuritis, unspecified: Secondary | ICD-10-CM | POA: Diagnosis not present

## 2015-11-26 DIAGNOSIS — M19079 Primary osteoarthritis, unspecified ankle and foot: Secondary | ICD-10-CM | POA: Diagnosis not present

## 2015-11-26 DIAGNOSIS — M79672 Pain in left foot: Secondary | ICD-10-CM | POA: Diagnosis not present

## 2015-11-26 DIAGNOSIS — I739 Peripheral vascular disease, unspecified: Secondary | ICD-10-CM

## 2015-11-26 NOTE — Progress Notes (Signed)
Patient ID: JALAIYA ELLIS, female   DOB: 1932-10-04, 82 y.o.   MRN: [id number]  Subjective: ORLENE GURSKY is a 82 y.o. female patient who returns to office for evaluation of sharp Left great toe pain, Admits that she finally got the topical cream and use 4x and now pain is gone. Denies any other pedal complaints at this time.   Patient Active Problem List   Diagnosis Date Noted  . Family history of breast cancer   . Family history of ovarian cancer   . Family history of colon cancer   . Genetic testing 03/18/2015  . Arthritis of knee, degenerative 12/16/2014  . Gonalgia 11/28/2014  . Unspecified erythematous condition 04/20/2012  . Swelling of limb 04/20/2012  . Pain in limb 04/20/2012  . History of breast cancer 01/19/2012  . Varicose veins of lower extremities with other complications 123XX123  . Leg swelling 02/25/2011    Current Outpatient Prescriptions on File Prior to Visit  Medication Sig Dispense Refill  . acetaminophen (TYLENOL) 325 MG tablet Take 325 mg by mouth every 6 (six) hours as needed for pain.    Marland Kitchen albuterol (PROVENTIL HFA;VENTOLIN HFA) 108 (90 BASE) MCG/ACT inhaler Inhale 2 puffs into the lungs every 4 (four) hours as needed for wheezing.    Marland Kitchen alendronate (FOSAMAX) 35 MG tablet Take 35 mg by mouth every 7 (seven) days. Take with a full glass of water on an empty stomach.    . diazepam (VALIUM) 5 MG tablet Take 5 mg by mouth as needed for anxiety.    . fluticasone (FLONASE) 50 MCG/ACT nasal spray Place 2 sprays into the nose as needed.     . gabapentin (NEURONTIN) 300 MG capsule Take 1 capsule (300 mg total) by mouth at bedtime. 30 capsule 1  . Glucosamine-Chondroitin (COSAMIN DS PO) Take by mouth daily.    Marland Kitchen levothyroxine (SYNTHROID, LEVOTHROID) 88 MCG tablet Take 88 mcg by mouth daily.      . meclizine (ANTIVERT) 25 MG tablet Take 25 mg by mouth daily.    . NONFORMULARY OR COMPOUNDED [place] compound:  Peripheral Neuropathy cream -  Bupivacaine 1%, Doxepin 3%, Gabapentin 6%, Pentoxifylline 3%, Topiramate 1%, apply 1-2 grams to affected area 3-4 times daily, +3refills. 120 each 3  . polyethylene glycol (MIRALAX / GLYCOLAX) packet Take 17 g by mouth as needed.     No current facility-administered medications on file prior to visit.    Allergies  Allergen Reactions  . Cephalosporins     Cefdinir  ( per Dr. Gara Kroner. [place] )  . Demerol   . Ibuprofen Swelling  . Meperidine Nausea Only  . Seasonal Ic [Cholestatin]     Objective:  General: Alert and oriented x3 in no acute distress  Dermatology: No open lesions bilateral lower extremities, no webspace macerations, no ecchymosis bilateral, all nails x 10 are well manicured.  Vascular: Dorsalis Pedis and Posterior Tibial pedal pulses palpable however, faint, Capillary Fill Time  5 seconds,(-) pedal hair growth bilateral,  1+ pitting edema bilateral lower extremities, Temperature gradient mildly decreased with varicosities and dependent rubor that improves once elevation.   Neurology: Gross sensation intact via light touch bilateral, Protective sensation absent with Thornell Mule Monofilament to all pedal sites, vibratory  absent bilateral, Deep tendon reflexes within normal limits bilateral, No babinski sign present bilateral. (- )Tinels sign bilateral. Resolved sharp/needle pain to left great toe medial aspect.   Musculoskeletal: No tenderness with palpation at Left great toe distal medial aspect,  mild exostosis at IPJ likely suggestive of osteoarthritis, No pain with calf compression bilateral.  Pedal and ankle joints within normal limits bilateral. Strength within normal limits in all groups bilateral.   Assessment and Plan: Problem List Items Addressed This Visit      Other   Pain in limb - Primary    Other Visit Diagnoses    Neuritis        PVD (peripheral vascular disease) ([place])        Relevant Medications    furosemide (LASIX) 20 MG tablet     Osteoarthritis of ankle and foot, unspecified laterality        Relevant Medications    betamethasone acetate-betamethasone sodium phosphate (CELESTONE) 6 (3-3) MG/ML injection    celecoxib (CELEBREX) 200 MG capsule    celecoxib (CELEBREX) 200 MG capsule    diclofenac (VOLTAREN) 75 MG EC tablet       -Complete examination performed -Discussed treatement options for likely neuritis/progressive neuropathy with secondary osteoarthritis and peripheral vascular disease -Continue with Topical pain cream from [facility] to use at Left great toe as needed -Cont with compression stockings and encouraged elevation of lower extremities to assist with edema control; encouraged patient to follow up again with vascular doctor - Recommend good supportive shoes daily and to use toe cushion to prevent excessive pressure to the left hallux -Continue with Celebrex for osteoarthritis which she is on for significant arthritis of her knees -Patient to return to office as needed or sooner if condition worsens.  Landis Martins, DPM

## 2015-12-15 DIAGNOSIS — Z9181 History of falling: Secondary | ICD-10-CM | POA: Diagnosis not present

## 2015-12-15 DIAGNOSIS — D531 Other megaloblastic anemias, not elsewhere classified: Secondary | ICD-10-CM | POA: Diagnosis not present

## 2015-12-15 DIAGNOSIS — R062 Wheezing: Secondary | ICD-10-CM | POA: Diagnosis not present

## 2015-12-15 DIAGNOSIS — Z1389 Encounter for screening for other disorder: Secondary | ICD-10-CM | POA: Diagnosis not present

## 2015-12-31 ENCOUNTER — Other Ambulatory Visit (INDEPENDENT_AMBULATORY_CARE_PROVIDER_SITE_OTHER): Payer: Medicare Other

## 2015-12-31 ENCOUNTER — Telehealth: Payer: Self-pay | Admitting: Pulmonary Disease

## 2015-12-31 ENCOUNTER — Encounter: Payer: Self-pay | Admitting: Pulmonary Disease

## 2015-12-31 ENCOUNTER — Ambulatory Visit (INDEPENDENT_AMBULATORY_CARE_PROVIDER_SITE_OTHER): Payer: Medicare Other | Admitting: Pulmonary Disease

## 2015-12-31 DIAGNOSIS — R062 Wheezing: Secondary | ICD-10-CM | POA: Diagnosis not present

## 2015-12-31 DIAGNOSIS — J849 Interstitial pulmonary disease, unspecified: Secondary | ICD-10-CM

## 2015-12-31 DIAGNOSIS — K219 Gastro-esophageal reflux disease without esophagitis: Secondary | ICD-10-CM

## 2015-12-31 DIAGNOSIS — E039 Hypothyroidism, unspecified: Secondary | ICD-10-CM | POA: Insufficient documentation

## 2015-12-31 DIAGNOSIS — M1990 Unspecified osteoarthritis, unspecified site: Secondary | ICD-10-CM | POA: Diagnosis not present

## 2015-12-31 LAB — C-REACTIVE PROTEIN: CRP: 0.1 mg/dL — ABNORMAL LOW (ref 0.5–20.0)

## 2015-12-31 LAB — SEDIMENTATION RATE: SED RATE: 11 mm/h (ref 0–30)

## 2015-12-31 LAB — RHEUMATOID FACTOR: Rhuematoid fact SerPl-aCnc: <10 IU/mL (ref <=14)

## 2015-12-31 NOTE — Patient Instructions (Signed)
   Call me if you have any new breathing problems before your next appointment or get significantly worse.  We will review your test results at your next appointment or call you if they need to be addressed sooner.  I will see you back in 6 weeks or sooner if needed.  TESTS ORDERED: 1. HRCT Chest w/o contrast 2. Full PFTs 3. 6MWT on room air 4. Serum Hypersensitivity Pneumonitis Panel, ANA w/ Reflex to comprehensive panel, ESR, CRP, RF, Anti-CCP, SSA, & SSB.

## 2015-12-31 NOTE — Progress Notes (Signed)
Subjective:    Patient ID: Pamela [name], female    DOB: 12-06-1932, 82 y.o.   MRN: [id number]  HPI She reports she has noticed wheezing for 6 months. She reports the wheezing seems to happen more at night or in the evening. She denies any specific triggers such as perfumes, smoke, heat, humidity, etc. She reports she has used her ProAir inhaler without any significant improvement in her wheezing. She does report an intermittent, nonproductive cough. She feels she cannot get a "deep" breath. She does feel this dyspnea is progressing somewhat. Denies any chest tightness, pressure, or pain. No rashes but does bruise easily. She denies any oral ulcers but does have dry eyes and dry mouth. She reports diffuse joint pain, especially her large joints. Has also had pain and swelling in her toes with swelling of her left great toe. She does have erythema with her swelling in her fingers and toes. She reports stiffness that never totally resolves in her hands. Denies any breathing problems as a child. She reports she has had pneumonia a couple of times. Has only had bronchitis once. She was treated for breast cancer with combination lumpectomy and right-sided breast radiation.  Review of Systems Does have occasional reflux. She reports she did previously have significant reflux that seems to have improved. Only intermittently treats with Tums. No morning brash water taste. No dysphagia or odynophagia. A pertinent 14 point review of systems is negative except as per the history of presenting illness.  Allergies  Allergen Reactions  . Cephalosporins     Cefdinir  ( per Dr. Gara Kroner. [place] )  . Demerol   . Ibuprofen Swelling  . Meperidine Nausea Only  . Seasonal Ic [Cholestatin]     Current Outpatient Prescriptions on File Prior to Visit  Medication Sig Dispense Refill  . acetaminophen (TYLENOL) 325 MG tablet Take 325 mg by mouth every 6 (six) hours as needed for pain.    Marland Kitchen albuterol (PROVENTIL  HFA;VENTOLIN HFA) 108 (90 BASE) MCG/ACT inhaler Inhale 2 puffs into the lungs every 4 (four) hours as needed for wheezing.    Marland Kitchen alendronate (FOSAMAX) 35 MG tablet Take 35 mg by mouth every 7 (seven) days. Take with a full glass of water on an empty stomach.    . celecoxib (CELEBREX) 200 MG capsule Take 200 mg by mouth daily.     . fluticasone (FLONASE) 50 MCG/ACT nasal spray Place 2 sprays into the nose as needed.     . furosemide (LASIX) 20 MG tablet as needed.     Marland Kitchen levothyroxine (SYNTHROID, LEVOTHROID) 88 MCG tablet Take 88 mcg by mouth daily.      . meclizine (ANTIVERT) 25 MG tablet Take 25 mg by mouth daily.    . polyethylene glycol (MIRALAX / GLYCOLAX) packet Take 17 g by mouth as needed.     No current facility-administered medications on file prior to visit.     Past Medical History:  Diagnosis Date  . Allergy    Rhinitis  . Arthritis    Osteo-athritis  . Cancer ([place])    Right breast  . Cancer ([place])    BCC -  skin  Right Cal  . DVT (deep venous thrombosis) ([facility])    Thrombophlebitis Left  . Family history of breast cancer   . Family history of colon cancer   . Family history of ovarian cancer   . Leg pain   . Peripheral vascular disease ([place])   . Phlebitis   .  Thyroid disease    Hypo-  . Vertigo     Past Surgical History:  Procedure Laterality Date  . BREAST LUMPECTOMY  2007   Right lumpectomy with radiation  . CYSTOSCOPY     For lesion of the urethra  . DEBRIDEMENT TENNIS ELBOW     Right elbow   . ENDOVENOUS ABLATION SAPHENOUS VEIN W/ LASER  07-29-2011   left greater saphenous vein  . excision of basal cell carcinoma  09-23-2011   right calf  . FOOT SURGERY    . HEMORRHOID SURGERY    . Thyroid ablation  1980  . TOE ARTHROPLASTY Right    5th Toe -  Hammer Toe  . TONSILLECTOMY     and Adenoids     Family History  Problem Relation Age of Onset  . Cancer Sister     breast cancer, vocal cord and parthyroid cancer  . Pulmonary fibrosis Mother   . Ovarian  cancer Maternal Aunt 60  . Colon cancer Maternal Uncle   . Cancer Paternal Aunt     NOS  . Colon cancer Paternal Uncle     dx in his 79s  . Breast cancer Maternal Aunt 80  . Cancer Maternal Uncle     NOS  . Breast cancer Cousin 76    maternal first cousin  . Cancer Cousin     cancer in her arm  . Rheumatologic disease Neg Hx     Social History   Social History  . Marital status: Married    Spouse name: N/A  . Number of children: 3  . Years of education: N/A   Occupational History  . homemaker    Social History Main Topics  . Smoking status: Passive Smoke Exposure - Never Smoker  . Smokeless tobacco: Never Used     Comment: Father & Husband smoked  . Alcohol use No     Comment: Occasional glass of wine  . Drug use: No  . Sexual activity: Not Currently    Birth control/ protection: Post-menopausal   Other Topics Concern  . None   Social History Narrative   Originally from [place]. Previously did live in [place]. 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 No pets currently. No bird exposure. No mold or hot tub exposure. Enjoys reading. Previously enjoyed golfing but stopped with "leg problems". She does enjoy volunteering.       Objective:   Physical Exam BP 110/70 (BP Location: Left Arm, Cuff Size: Normal)   Pulse 65   Ht 5' 8"  (1.727 m)   Wt 146 lb 6.4 oz (66.4 kg)   SpO2 98%   BMI 22.26 kg/m  General:  Awake. Alert. No acute distress.   Integument:  Warm & dry. No rash on exposed skin. No bruising. Lymphatics:  No appreciated cervical or supraclavicular lymphadenoapthy. HEENT:  Moist mucus membranes. No oral ulcers. No scleral injection or icterus.  Cardiovascular:  Regular rate. Pitting lower extremity edema with compression stockings. No appreciable JVD.  Pulmonary:  Good aeration & clear to auscultation bilaterally. Symmetric chest wall expansion. No accessory muscle use on room air. Abdomen: Soft. Normal  bowel sounds. Nondistended. Grossly nontender. Musculoskeletal:  Normal bulk and tone. Hand grip strength 5/5 bilaterally. No joint effusion appreciated. Swelling of bilateral DIP and PIP joints with some joint deformity. Neurological:  CN 2-12 grossly in tact. No meningismus. Moving all 4 extremities equally. Symmetric brachioradialis deep tendon reflexes. Psychiatric:  Mood and  affect congruent. Speech normal rhythm, rate & tone.   IMAGING CXR PA/LAT 11/15/15 (per radiologist):  Normal heart size w/ biapical scarring. Mildly diffuse increased interstitial markings. Old left healed rib fractures. No mass or effusion. Persistent right basilar density. Thoracic compression fractures.   CT CHEST W/O 04/27/12 (personally reviewed by me): No pleural effusion. Apical predominate subpleural reticulation and opacification consistent with fibrotic change. No other parenchymal opacity or nodule appreciated. Borderline precarinal lymphadenopathy. No pericardial effusion.  LABS 11/19/15 CBC:  5.9/13.6/40.9/207 BMP:  140/4.1/105/25/18/0.81/95/9.5 LFT:  3.8/6.8/0.7/59/10/15 BNP:  77.9    Assessment & Plan:  82 y.o. female with underlying interstitial lung disease based on previous chest CT imaging. With her familial history of pulmonary fibrosis I do question a possible autoimmune etiology especially with upper lobe predominance. With her report a dry eyes and dry mouth Sjogren's must be considered. Her history of joint arthritis would raise the possibility of underlying rheumatoid but her physical exam is more consistent with osteoarthritis. I do not believe that immunosuppression is indicated at this time. Certainly underlying COPD/airways dysfunction could be contributing to her wheezing which I do not feel is secondary to her underlying pulmonary fibrosis. Her reflux seems to be well-controlled at this time in the pattern on her CT imaging is not consistent with a post inflammatory fibrosis from chronic  reflux. I instructed the patient to contact my office if she had any further questions before next appointment and informed her we would review her test results at her follow-up appointment.  1. Wheezing: Checking full pulmonary function testing. 2. ILD: Upper lobe predominant. Checking high-resolution CT chest without contrast. Checking hypersensitivity pneumonitis panel along with ANA with reflex to comprehensive panel, SSA, and SSB. Checking 6 minute walk test at next appointment. 3. GERD: Well-controlled. No new medications at this time.  4. Arthritis: Checking serum ESR, CRP, rheumatoid factor, and anti-CCP.  5. Health Maintenance:  Received Pneumovax 2007 6. Follow-up: Patient to return to clinic in 6 weeks or sooner if needed.  Sonia Baller Ashok Cordia, M.D. [facility] Pager:  [phone] After 3pm or if no response, call [phone] 3:58 PM 12/31/15

## 2015-12-31 NOTE — Telephone Encounter (Signed)
IMAGING CT CHEST W/O 04/27/12 (personally reviewed by me): No pleural effusion. Apical predominate subpleural reticulation and opacification consistent with fibrotic change. No other parenchymal opacity or nodule appreciated. Borderline precarinal lymphadenopathy. No pericardial effusion.

## 2016-01-01 LAB — ANA W/REFLEX: Anti Nuclear Antibody(ANA): NEGATIVE

## 2016-01-01 LAB — ANA COMPREHENSIVE PANEL
Chromatin Ab SerPl-aCnc: <0.2 AI (ref 0.0–0.9)
DSDNA AB: 1 [IU]/mL (ref 0–9)
ENA SSA (RO) Ab: <0.2 AI (ref 0.0–0.9)
ENA SSB (LA) Ab: <0.2 AI (ref 0.0–0.9)
Scleroderma SCL-70: <0.2 AI (ref 0.0–0.9)

## 2016-01-01 LAB — SJOGRENS SYNDROME-A EXTRACTABLE NUCLEAR ANTIBODY: SSA (RO) (ENA) ANTIBODY, IGG: NEGATIVE

## 2016-01-01 LAB — CYCLIC CITRUL PEPTIDE ANTIBODY, IGG: Cyclic Citrullin Peptide Ab: <16 Units

## 2016-01-01 LAB — SJOGRENS SYNDROME-B EXTRACTABLE NUCLEAR ANTIBODY: SSB (LA) (ENA) ANTIBODY, IGG: NEGATIVE

## 2016-01-06 ENCOUNTER — Ambulatory Visit (INDEPENDENT_AMBULATORY_CARE_PROVIDER_SITE_OTHER)
Admission: RE | Admit: 2016-01-06 | Discharge: 2016-01-06 | Disposition: A | Discharge status: Home / Self Care | Payer: Medicare Other | Source: Ambulatory Visit | Attending: Pulmonary Disease | Admitting: Pulmonary Disease

## 2016-01-06 DIAGNOSIS — J849 Interstitial pulmonary disease, unspecified: Secondary | ICD-10-CM

## 2016-01-06 LAB — HYPERSENSITIVITY PNUEMONITIS PROFILE

## 2016-01-09 ENCOUNTER — Encounter (INDEPENDENT_AMBULATORY_CARE_PROVIDER_SITE_OTHER): Payer: Medicare Other | Admitting: Pulmonary Disease

## 2016-01-09 DIAGNOSIS — J849 Interstitial pulmonary disease, unspecified: Secondary | ICD-10-CM

## 2016-01-09 LAB — PULMONARY FUNCTION TEST
DL/VA % pred: 95 %
DL/VA: 4.95 ml/min/mmHg/L
DLCO cor % pred: 49 %
DLCO cor: 14.19 ml/min/mmHg
DLCO unc % pred: 46 %
DLCO unc: 13.18 ml/min/mmHg
FEF 25-75 Post: 1.18 L/sec
FEF 25-75 Pre: 0.84 L/sec
FEF2575-%Change-Post: 41 %
FEF2575-%PRED-POST: 76 %
FEF2575-%Pred-Pre: 54 %
FEV1-%CHANGE-POST: 7 %
FEV1-%Pred-Post: 61 %
FEV1-%Pred-Pre: 57 %
FEV1-POST: 1.37 L
FEV1-Pre: 1.27 L
FEV1FVC-%Change-Post: 9 %
FEV1FVC-%PRED-PRE: 97 %
FEV6-%Change-Post: -1 %
FEV6-%PRED-PRE: 62 %
FEV6-%Pred-Post: 61 %
FEV6-Post: 1.73 L
FEV6-Pre: 1.76 L
FEV6FVC-%Change-Post: 0 %
FEV6FVC-%PRED-PRE: 105 %
FEV6FVC-%Pred-Post: 105 %
FVC-%Change-Post: -2 %
FVC-%PRED-POST: 58 %
FVC-%PRED-PRE: 59 %
FVC-PRE: 1.76 L
FVC-Post: 1.73 L
Post FEV1/FVC ratio: 79 %
Post FEV6/FVC ratio: 100 %
Pre FEV1/FVC ratio: 72 %
Pre FEV6/FVC Ratio: 100 %
RV % PRED: 101 %
RV: 2.62 L
TLC % PRED: 81 %
TLC: 4.51 L

## 2016-02-11 DIAGNOSIS — M1712 Unilateral primary osteoarthritis, left knee: Secondary | ICD-10-CM | POA: Diagnosis not present

## 2016-02-11 DIAGNOSIS — M1711 Unilateral primary osteoarthritis, right knee: Secondary | ICD-10-CM | POA: Diagnosis not present

## 2016-02-13 DIAGNOSIS — D531 Other megaloblastic anemias, not elsewhere classified: Secondary | ICD-10-CM | POA: Diagnosis not present

## 2016-02-17 ENCOUNTER — Ambulatory Visit: Payer: Medicare Other

## 2016-02-17 ENCOUNTER — Ambulatory Visit: Payer: Medicare Other | Admitting: Pulmonary Disease

## 2016-02-18 ENCOUNTER — Ambulatory Visit (INDEPENDENT_AMBULATORY_CARE_PROVIDER_SITE_OTHER): Payer: Medicare Other | Admitting: Pulmonary Disease

## 2016-02-18 ENCOUNTER — Encounter: Payer: Self-pay | Admitting: Pulmonary Disease

## 2016-02-18 VITALS — BP 132/78 | HR 68 | Ht 68.0 in | Wt 146.4 lb

## 2016-02-18 DIAGNOSIS — J849 Interstitial pulmonary disease, unspecified: Secondary | ICD-10-CM

## 2016-02-18 DIAGNOSIS — R0600 Dyspnea, unspecified: Secondary | ICD-10-CM | POA: Diagnosis not present

## 2016-02-18 DIAGNOSIS — Z23 Encounter for immunization: Secondary | ICD-10-CM | POA: Diagnosis not present

## 2016-02-18 DIAGNOSIS — K219 Gastro-esophageal reflux disease without esophagitis: Secondary | ICD-10-CM | POA: Diagnosis not present

## 2016-02-18 NOTE — Patient Instructions (Signed)
   Call me if you have any new breathing problems before your next appointment.  I will see you back in 3 months with a breathing & walking test or sooner if needed.  Remember to get your Flu Shot in 3-4 weeks from today.  TESTS ORDERED: 1. Spirometry with DLCO at next appointment 2. 6 minute walk test on room air at next appointment

## 2016-02-18 NOTE — Progress Notes (Signed)
Subjective:    Patient ID: Pamela [name], female    DOB: [birth year], 82 y.o.   MRN: [id number]  C.C.:  Follow-up for ILD & GERD.  HPI ILD:  Upper lobe predominant. Serum autoimmune workup is negative. Does have family history of pulmonary fibrosis. Reports baseline dyspnea that is unchanged. Reports mild, intermittent nonproductive cough. She is still having some wheezing. She isn't sure if her Albuterol inhaler is helping.   GERD:  No reflux, dyspepsia, or morning brash water taste. No medications as this time.  Review of Systems No chest pain, pressure, or tightness. No fever, chills, or sweats. No rashes or bruising.   Allergies  Allergen Reactions  . Cephalosporins     Cefdinir  ( per Dr. Gara Kroner. [place] )  . Demerol   . Ibuprofen Swelling  . Meperidine Nausea Only  . Seasonal Ic [Cholestatin]     Current Outpatient Prescriptions on File Prior to Visit  Medication Sig Dispense Refill  . acetaminophen (TYLENOL) 325 MG tablet Take 325 mg by mouth every 6 (six) hours as needed for pain.    Marland Kitchen albuterol (PROVENTIL HFA;VENTOLIN HFA) 108 (90 BASE) MCG/ACT inhaler Inhale 2 puffs into the lungs every 4 (four) hours as needed for wheezing.    Marland Kitchen alendronate (FOSAMAX) 35 MG tablet Take 35 mg by mouth every 7 (seven) days. Take with a full glass of water on an empty stomach.    . celecoxib (CELEBREX) 200 MG capsule Take 200 mg by mouth daily.     . fluticasone (FLONASE) 50 MCG/ACT nasal spray Place 2 sprays into the nose as needed.     . furosemide (LASIX) 20 MG tablet as needed.     Marland Kitchen levothyroxine (SYNTHROID, LEVOTHROID) 88 MCG tablet Take 88 mcg by mouth daily.      . meclizine (ANTIVERT) 25 MG tablet Take 25 mg by mouth daily.    . Multiple Vitamin (MULTIVITAMIN) tablet Take 1 tablet by mouth daily.    . polyethylene glycol (MIRALAX / GLYCOLAX) packet Take 17 g by mouth as needed.    . Vitamin D, Ergocalciferol, (DRISDOL) 50000 units CAPS capsule Take 50,000 Units by mouth. Once a  month     No current facility-administered medications on file prior to visit.     Past Medical History:  Diagnosis Date  . Allergy    Rhinitis  . Arthritis    Osteo-athritis  . Cancer ([place])    Right breast  . Cancer ([place])    BCC -  skin  Right Cal  . DVT (deep venous thrombosis) ([facility])    Thrombophlebitis Left  . Family history of breast cancer   . Family history of colon cancer   . Family history of ovarian cancer   . Leg pain   . Peripheral vascular disease ([place])   . Phlebitis   . Thyroid disease    Hypo-  . Vertigo     Past Surgical History:  Procedure Laterality Date  . BREAST LUMPECTOMY  2007   Right lumpectomy with radiation  . CYSTOSCOPY     For lesion of the urethra  . DEBRIDEMENT TENNIS ELBOW     Right elbow   . ENDOVENOUS ABLATION SAPHENOUS VEIN W/ LASER  07-29-2011   left greater saphenous vein  . excision of basal cell carcinoma  09-23-2011   right calf  . FOOT SURGERY    . HEMORRHOID SURGERY    . Thyroid ablation  1980  . TOE ARTHROPLASTY Right  5th Toe -  Hammer Toe  . TONSILLECTOMY     and Adenoids     Family History  Problem Relation Age of Onset  . Cancer Sister     breast cancer, vocal cord and parthyroid cancer  . Pulmonary fibrosis Mother   . Ovarian cancer Maternal Aunt 60  . Colon cancer Maternal Uncle   . Cancer Paternal Aunt     NOS  . Colon cancer Paternal Uncle     dx in his 61s  . Breast cancer Maternal Aunt 80  . Cancer Maternal Uncle     NOS  . Breast cancer Cousin 53    maternal first cousin  . Cancer Cousin     cancer in her arm  . Rheumatologic disease Neg Hx     Social History   Social History  . Marital status: Married    Spouse name: N/A  . Number of children: 3  . Years of education: N/A   Occupational History  . homemaker    Social History Main Topics  . Smoking status: Passive Smoke Exposure - Never Smoker  . Smokeless tobacco: Never Used     Comment: Father & Husband smoked  . Alcohol use No       Comment: Occasional glass of wine  . Drug use: No  . Sexual activity: Not Currently    Birth control/ protection: Post-menopausal   Other Topics Concern  . None   Social History Narrative   Originally from [place]. Previously did live in [place]. 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 No pets currently. No bird exposure. No mold or hot tub exposure. Enjoys reading. Previously enjoyed golfing but stopped with "leg problems". She does enjoy volunteering.       Objective:   Physical Exam BP 132/78 (BP Location: Left Arm, Cuff Size: Normal)   Pulse 68   Ht _0  (1.727 m)   Wt 146 lb 6.4 oz (66.4 kg)   SpO2 100%   BMI 22.26 kg/m  General:  Awake. Alert. No distress.   Integument:  Warm & dry. No rash on exposed skin.  Lymphatics:  No appreciated cervical or supraclavicular lymphadenoapthy. HEENT:  Moist mucus membranes. No oral ulcers. No scleral icterus.  Cardiovascular:  Regular rate & rhythm. Pitting lower extremity edema with compression stockings.   Pulmonary: Mild bilateral squeaks. Otherwise good aeration. Normal work of breathing without accessory muscle use on room air. Abdomen: Soft. Normal bowel sounds. Nontender.  PFT 02/11/16: FVC 1.76 L (59%) FEV1 1.27 L (57%) FEV1/FVC 0.72 FEF 25-75 0.84 L (54%) negative bronchodilator response TLC 4.51 L (81%) RV 101% ERV 172% DLCO corrected 49% (Hgb 11.3)  6MWT 02/18/16:  Walked 324 meters / Baseline Sat 98% on RA / Nadir Sat 97% on RA @ end of test  IMAGING HRCT CHEST W/O 01/06/16 (personally reviewed by me): Mid and upper lung zone predominant subpleural reticulation with mild traction bronchiectasis. No honeycombing changes. Subcentimeter nodules within right lower lobe with some surrounding ground glass. There is a continuation. No pleural effusion or thickening. No pericardial effusion. No pathologic mediastinal adenopathy.  CXR PA/LAT 11/15/15 (per radiologist):  Normal heart  size w/ biapical scarring. Mildly diffuse increased interstitial markings. Old left healed rib fractures. No mass or effusion. Persistent right basilar density. Thoracic compression fractures.   CT CHEST W/O 04/27/12 (previously reviewed by me): No pleural effusion. Apical predominate subpleural reticulation and opacification consistent with fibrotic change. No  other parenchymal opacity or nodule appreciated. Borderline precarinal lymphadenopathy. No pericardial effusion.  LABS 12/31/15 ESR: 11 CRP: 0.1 Hypersensitivity pneumonitis panel: Negative ANA: Negative Anti-Jo 1:  <0.2 Centromere antibody screen:  <0.2 Anti-CCP:  <16 Rheumatoid factor:  <10 Double-stranded DNA antibody: 1 Smith antibody:  <0.2 Chromagen antibody:  <0.2 SCL 70:  <0.2 RNP antibody:  <0.2 SSA:  <0.2 SSB:  <0.2  11/19/15 CBC:  5.9/13.6/40.9/207 BMP:  140/4.1/105/25/18/0.81/95/9.5 LFT:  3.8/6.8/0.7/59/10/15 BNP:  77.9    Assessment & Plan:  82 y.o. female with ILD that his upper lobe predominant and a family history of pulmonary fibrosis. Suspect this is some familial form of interstitial lung disease but without serologies to confirm an autoimmune process I do not feel that initiating autoimmune therapy is necessary at this time. Additionally, she has no evidence of oxygen requirement or overt groundglass that would suggest ongoing inflammation. We did discuss performing a surgical lung biopsy but again given her minimal symptoms and on a function testing performed today I do not feel this would be of great benefit to the patient and carry with multiple risk factors and comorbidities. As such, we are deferring surgical lung biopsy at this time. We are also deferring immunotherapy. I instructed the patient to contact my office if she had any new breathing problems or questions before her next appointment as I would be happy to see her sooner.  1. ILD: Repeat spirometry with DLCO and 6 prolonged test on room air at  next appointment. Consider repeat high-resolution CT scan in 6-12 months to follow-up on nodularity within right lower lobe. 2. GERD: Currently asymptomatic off medication. No new medications at this time. 3. Health Maintenance:  Received Pneumovax 22 June 2013. Administering Prevnar vaccine today. Recommended influenza vaccine and 3-4 weeks.  4. Follow-up: Patient to return to clinic in 3 months or sooner if needed.   Sonia Baller Ashok Cordia, M.D. [facility] Pager:  [phone] After 3pm or if no response, call [phone] 12:30 PM 02/18/16

## 2016-02-18 NOTE — Progress Notes (Signed)
Test reviewed.  

## 2016-02-27 ENCOUNTER — Telehealth: Payer: Self-pay | Admitting: Pulmonary Disease

## 2016-02-27 NOTE — Telephone Encounter (Signed)
Spoke with pt, states she is having a local reaction to pneumonia vaccine.  Vaccine was given to pt in [facility] on 02/18/16 at pt request-  I gave this vaccine to pt and it is documented that per pt she cannot receive IM injections in either arm.   2 days ago noticed a red spot at injection site, hard lump under skin.  Today, redness and lump has grown to size of approx her palm.  Pt denies fever, difficulty walking or moving, or pain at injection site.  Pt states this is normal for her to have this type of reaction to an injection.   Pt also notes she flew in yesterday from [place]- states she is fatigued but states this is d/t jet lag and not from her rx reaction.   Pt has applied topical hydrocortisone cream and took oral benadryl to help with s/s.    JN please advise on further recs.  Thanks.

## 2016-02-27 NOTE — Telephone Encounter (Signed)
I would have her continue with the benadryl as needed. If the area enlarges in size any further or she notices any new symptoms she should get checked out either at our office or her PCP's. Thanks.

## 2016-02-27 NOTE — Telephone Encounter (Signed)
Spoke with pt,aware of recs.  Nothing further needed.  

## 2016-03-02 DIAGNOSIS — L814 Other melanin hyperpigmentation: Secondary | ICD-10-CM | POA: Diagnosis not present

## 2016-03-02 DIAGNOSIS — L570 Actinic keratosis: Secondary | ICD-10-CM | POA: Diagnosis not present

## 2016-03-02 DIAGNOSIS — L821 Other seborrheic keratosis: Secondary | ICD-10-CM | POA: Diagnosis not present

## 2016-03-10 ENCOUNTER — Ambulatory Visit: Payer: Medicare Other

## 2016-03-19 ENCOUNTER — Encounter: Payer: Self-pay | Admitting: Sports Medicine

## 2016-03-19 ENCOUNTER — Ambulatory Visit (INDEPENDENT_AMBULATORY_CARE_PROVIDER_SITE_OTHER): Payer: Medicare Other | Admitting: Sports Medicine

## 2016-03-19 ENCOUNTER — Telehealth: Payer: Self-pay | Admitting: Pulmonary Disease

## 2016-03-19 DIAGNOSIS — M79676 Pain in unspecified toe(s): Secondary | ICD-10-CM

## 2016-03-19 DIAGNOSIS — L600 Ingrowing nail: Secondary | ICD-10-CM

## 2016-03-19 DIAGNOSIS — M792 Neuralgia and neuritis, unspecified: Secondary | ICD-10-CM

## 2016-03-19 DIAGNOSIS — I739 Peripheral vascular disease, unspecified: Secondary | ICD-10-CM

## 2016-03-19 DIAGNOSIS — M79671 Pain in right foot: Secondary | ICD-10-CM

## 2016-03-19 DIAGNOSIS — M19079 Primary osteoarthritis, unspecified ankle and foot: Secondary | ICD-10-CM | POA: Diagnosis not present

## 2016-03-19 NOTE — Progress Notes (Signed)
Patient ID: Pamela [name], female   DOB: 1932/09/23, 82 y.o.   MRN: [id number]  Subjective: Pamela [name] is a 82 y.o. female patient who presents to office for evaluation of sharp no Right great toe pain, Admits that the pain is just like it was at the left big toe a few months ago. Denies any other pedal complaints at this time.   Patient Active Problem List   Diagnosis Date Noted  . ILD (interstitial lung disease) ([place]) 12/31/2015  . Wheeze 12/31/2015  . GERD (gastroesophageal reflux disease) 12/31/2015  . Arthritis 12/31/2015  . Hypothyroidism 12/31/2015  . Family history of breast cancer   . Family history of ovarian cancer   . Family history of colon cancer   . Genetic testing 03/18/2015  . Arthritis of knee, degenerative 12/16/2014  . Gonalgia 11/28/2014  . Unspecified erythematous condition 04/20/2012  . Swelling of limb 04/20/2012  . Pain in limb 04/20/2012  . History of breast cancer 01/19/2012  . Varicose veins of lower extremities with other complications 123XX123  . Leg swelling 02/25/2011  . Hyperthyroidism 05/31/1978    Current Outpatient Prescriptions on File Prior to Visit  Medication Sig Dispense Refill  . acetaminophen (TYLENOL) 325 MG tablet Take 325 mg by mouth every 6 (six) hours as needed for pain.    Marland Kitchen albuterol (PROVENTIL HFA;VENTOLIN HFA) 108 (90 BASE) MCG/ACT inhaler Inhale 2 puffs into the lungs every 4 (four) hours as needed for wheezing.    Marland Kitchen alendronate (FOSAMAX) 35 MG tablet Take 35 mg by mouth every 7 (seven) days. Take with a full glass of water on an empty stomach.    . celecoxib (CELEBREX) 200 MG capsule Take 200 mg by mouth daily.     . fluticasone (FLONASE) 50 MCG/ACT nasal spray Place 2 sprays into the nose as needed.     . furosemide (LASIX) 20 MG tablet as needed.     Marland Kitchen levothyroxine (SYNTHROID, LEVOTHROID) 88 MCG tablet Take 88 mcg by mouth daily.      . meclizine (ANTIVERT) 25 MG tablet Take 25 mg by mouth daily.    . Multiple  Vitamin (MULTIVITAMIN) tablet Take 1 tablet by mouth daily.    . polyethylene glycol (MIRALAX / GLYCOLAX) packet Take 17 g by mouth as needed.    . Vitamin D, Ergocalciferol, (DRISDOL) 50000 units CAPS capsule Take 50,000 Units by mouth. Once a month     No current facility-administered medications on file prior to visit.     Allergies  Allergen Reactions  . Cephalosporins     Cefdinir  ( per Dr. Gara Kroner. [place] )  . Demerol   . Ibuprofen Swelling  . Meperidine Nausea Only  . Seasonal Ic [Cholestatin]     Objective:  General: Alert and oriented x3 in no acute distress  Dermatology: No open lesions bilateral lower extremities, no webspace macerations, no ecchymosis bilateral, all nails x 10 are well manicured but at left hallux nail there is distal lifting and subungal debris with mild ingrowing at medial margin with no acute signs of infection.  Vascular: Dorsalis Pedis and Posterior Tibial pedal pulses palpable however, faint, Capillary Fill Time  5 seconds,(-) pedal hair growth bilateral,  1+ pitting edema bilateral lower extremities, Temperature gradient mildly decreased with varicosities and dependent rubor that improves once elevation.   Neurology: Gross sensation intact via light touch bilateral, Protective sensation absent with Thornell Mule Monofilament to all pedal sites, vibratory  absent bilateral, Deep tendon reflexes within normal  limits bilateral, No babinski sign present bilateral. (- )Tinels sign bilateral. New sharp/needle pain to right great toe medial aspect. Resolved pain on left.   Musculoskeletal: There is tenderness with palpation at Right great toe distal medial aspect, mild exostosis at IPJ likely suggestive of osteoarthritis L>R, No pain with calf compression bilateral.  Pedal and ankle joints within normal limits bilateral. Strength within normal limits in all groups bilateral.   Assessment and Plan: Problem List Items Addressed This Visit      Other    Pain in limb    Other Visit Diagnoses    Ingrown nail    -  Primary   Neuritis       PVD (peripheral vascular disease) ([facility])       Osteoarthritis of ankle and foot, unspecified laterality          -Complete examination performed -Discussed treatement options for possible ingrowing and nail lysis with likely neuritis/progressive neuropathy with secondary osteoarthritis and peripheral vascular disease -Patient declined nail procedure for right -Symptomatic debrided using a sterile nail nipper the right hallux nail -Continue with Topical pain cream from [facility] to use at right great toe as needed -Cont with compression stockings and encouraged elevation of lower extremities to assist with edema control; encouraged patient to follow up again with vascular doctor - Recommend good supportive shoes daily and to use toe cushion to prevent excessive pressure to the right hallux -Continue with Celebrex for osteoarthritis which she is on for significant arthritis of her knees -Patient to return to office as needed or sooner if condition worsens.  Landis Martins, DPM

## 2016-03-19 NOTE — Telephone Encounter (Signed)
Called spoke with patient who reported she had a severe reaction to the pneumonia vaccine that she received at the 9.20.17 office visit (see the 9.29.17 phone note).  Her symptoms finally fully resolved earlier this week.  Now she would like to know if it would be appropriate for her to get her flu vaccine.  She stated that taking Benadryl did help the site reaction.  Pt is aware JN is on vacation until 10.24.17 and is okay with a call back when he returns JN please advise, thank you.

## 2016-03-23 NOTE — Telephone Encounter (Signed)
She is ok to go ahead with her Flu Vaccine if she hasn't had trouble with this in the past. I would argue against the High Dose Flu Shot thought.

## 2016-03-23 NOTE — Telephone Encounter (Signed)
Called and spoke with pt and she stated that she can take benadryl before she gets the flu vaccine and can take it for a day after and she is ok.  She is aware of JN recs.  She will call back when she is ready to come in for this vaccine.  She is aware that she will need to take the regular flu vaccine and not the high dose.

## 2016-04-07 ENCOUNTER — Ambulatory Visit: Payer: Medicare Other | Admitting: Sports Medicine

## 2016-04-08 ENCOUNTER — Ambulatory Visit (INDEPENDENT_AMBULATORY_CARE_PROVIDER_SITE_OTHER): Payer: Medicare Other | Admitting: Sports Medicine

## 2016-04-08 ENCOUNTER — Encounter: Payer: Self-pay | Admitting: Sports Medicine

## 2016-04-08 DIAGNOSIS — B351 Tinea unguium: Secondary | ICD-10-CM | POA: Diagnosis not present

## 2016-04-08 DIAGNOSIS — M19079 Primary osteoarthritis, unspecified ankle and foot: Secondary | ICD-10-CM | POA: Diagnosis not present

## 2016-04-08 DIAGNOSIS — L600 Ingrowing nail: Secondary | ICD-10-CM

## 2016-04-08 DIAGNOSIS — M792 Neuralgia and neuritis, unspecified: Secondary | ICD-10-CM

## 2016-04-08 DIAGNOSIS — M79672 Pain in left foot: Secondary | ICD-10-CM

## 2016-04-08 DIAGNOSIS — M79671 Pain in right foot: Secondary | ICD-10-CM

## 2016-04-08 DIAGNOSIS — I739 Peripheral vascular disease, unspecified: Secondary | ICD-10-CM

## 2016-04-08 NOTE — Progress Notes (Signed)
Patient ID: SALOME SALBER, female   DOB: [birth year], 82 y.o.   MRN: [id number]  Subjective: Pamela [name] is a 82 y.o. female patient who presents to office for evaluation of sharp Right>left great toe pain at nail margins, Admits that she can only wear 1 pair of shoes that is open toed comfortably. Denies any other pedal complaints at this time.   Patient Active Problem List   Diagnosis Date Noted  . ILD (interstitial lung disease) ([place]) 12/31/2015  . Wheeze 12/31/2015  . GERD (gastroesophageal reflux disease) 12/31/2015  . Arthritis 12/31/2015  . Hypothyroidism 12/31/2015  . Family history of breast cancer   . Family history of ovarian cancer   . Family history of colon cancer   . Genetic testing 03/18/2015  . Arthritis of knee, degenerative 12/16/2014  . Gonalgia 11/28/2014  . Unspecified erythematous condition 04/20/2012  . Swelling of limb 04/20/2012  . Pain in limb 04/20/2012  . History of breast cancer 01/19/2012  . Varicose veins of lower extremities with other complications 123XX123  . Leg swelling 02/25/2011  . Hyperthyroidism 05/31/1978    Current Outpatient Prescriptions on File Prior to Visit  Medication Sig Dispense Refill  . acetaminophen (TYLENOL) 325 MG tablet Take 325 mg by mouth every 6 (six) hours as needed for pain.    Marland Kitchen albuterol (PROVENTIL HFA;VENTOLIN HFA) 108 (90 BASE) MCG/ACT inhaler Inhale 2 puffs into the lungs every 4 (four) hours as needed for wheezing.    Marland Kitchen alendronate (FOSAMAX) 35 MG tablet Take 35 mg by mouth every 7 (seven) days. Take with a full glass of water on an empty stomach.    . celecoxib (CELEBREX) 200 MG capsule Take 200 mg by mouth daily.     . fluticasone (FLONASE) 50 MCG/ACT nasal spray Place 2 sprays into the nose as needed.     . furosemide (LASIX) 20 MG tablet as needed.     Marland Kitchen levothyroxine (SYNTHROID, LEVOTHROID) 88 MCG tablet Take 88 mcg by mouth daily.      . meclizine (ANTIVERT) 25 MG tablet Take 25 mg by mouth daily.     . Multiple Vitamin (MULTIVITAMIN) tablet Take 1 tablet by mouth daily.    . polyethylene glycol (MIRALAX / GLYCOLAX) packet Take 17 g by mouth as needed.    . Vitamin D, Ergocalciferol, (DRISDOL) 50000 units CAPS capsule Take 50,000 Units by mouth. Once a month     No current facility-administered medications on file prior to visit.     Allergies  Allergen Reactions  . Cephalosporins     Cefdinir  ( per Dr. Gara Kroner. [place] )  . Demerol   . Ibuprofen Swelling  . Meperidine Nausea Only  . Seasonal Ic [Cholestatin]     Objective:  General: Alert and oriented x3 in no acute distress  Dermatology: No open lesions bilateral lower extremities, no webspace macerations, no ecchymosis bilateral, all nails x 10 are well manicured but at left and right hallux nail there is distal lifting and subungal debris with mild ingrowing at medial margin with no acute signs of infection. Dry blood under left 4th toenail.  Vascular: Dorsalis Pedis and Posterior Tibial pedal pulses palpable however, faint, Capillary Fill Time 5 seconds,(-) pedal hair growth bilateral,  1+ pitting edema bilateral lower extremities, Temperature gradient mildly decreased with varicosities and dependent rubor that improves once elevation.   Neurology: Gross sensation intact via light touch bilateral, Protective sensation absentwith Semmes Weinstein Monofilament to all pedal sites, vibratory  absent bilateral,  Deep tendon reflexes within normal limits bilateral, No babinski sign present bilateral. (- )Tinels sign bilateral. Sharp pain to both great toes around nail.  Musculoskeletal: There is tenderness with palpation at Right>left great toe distal medial aspect, mild exostosis at IPJ likely suggestive of osteoarthritis L>R, No pain with calf compression bilateral.  Pedal and ankle joints within normal limits bilateral. Strength within normal limits in all groups bilateral.   Assessment and Plan: Problem List Items Addressed This  Visit      Other   Pain in limb    Other Visit Diagnoses    Ingrown nail    -  Primary   Onychomycosis       Neuritis       PVD (peripheral vascular disease) ([facility])       Osteoarthritis of ankle and foot, unspecified laterality          -Complete examination performed -Discussed treatement options for possible Mycotic ingrowing and nail lysis with likely neuritis/progressive neuropathy with secondary osteoarthritis and peripheral vascular disease -Patient declined nail trim and procedure today. States that she would like to think about her options -Patient to go to her vein/vascular doctor to discuss procedure since she knows that she heals slowly from previous leg wounds that won't to heal -Continue with Topical pain cream from [facility] to use at great toes as needed -Cont with compression stockings and encouraged elevation of lower extremities to assist with edema control - Recommend good supportive shoes daily -Continue with Celebrex for osteoarthritis which she is on for significant arthritis of her knees -Patient to return to office as needed/after she see vascular doctor or sooner if condition worsens.  Landis Martins, DPM

## 2016-05-18 DIAGNOSIS — M170 Bilateral primary osteoarthritis of knee: Secondary | ICD-10-CM | POA: Diagnosis not present

## 2016-05-18 DIAGNOSIS — M25551 Pain in right hip: Secondary | ICD-10-CM | POA: Diagnosis not present

## 2016-05-18 DIAGNOSIS — G8929 Other chronic pain: Secondary | ICD-10-CM | POA: Diagnosis not present

## 2016-05-20 ENCOUNTER — Ambulatory Visit (INDEPENDENT_AMBULATORY_CARE_PROVIDER_SITE_OTHER): Payer: Medicare Other | Admitting: Pulmonary Disease

## 2016-05-20 ENCOUNTER — Encounter: Payer: Self-pay | Admitting: Pulmonary Disease

## 2016-05-20 VITALS — BP 114/68 | HR 55 | Temp 98.1°F | Ht 67.25 in | Wt 148.0 lb

## 2016-05-20 DIAGNOSIS — J849 Interstitial pulmonary disease, unspecified: Secondary | ICD-10-CM | POA: Diagnosis not present

## 2016-05-20 DIAGNOSIS — K219 Gastro-esophageal reflux disease without esophagitis: Secondary | ICD-10-CM | POA: Diagnosis not present

## 2016-05-20 DIAGNOSIS — Z23 Encounter for immunization: Secondary | ICD-10-CM | POA: Diagnosis not present

## 2016-05-20 DIAGNOSIS — R0600 Dyspnea, unspecified: Secondary | ICD-10-CM

## 2016-05-20 LAB — PULMONARY FUNCTION TEST
DL/VA % pred: 99 %
DL/VA: 5.16 ml/min/mmHg/L
DLCO COR: 16.02 ml/min/mmHg
DLCO UNC: 15.39 ml/min/mmHg
DLCO cor % pred: 55 %
DLCO unc % pred: 53 %
FEF 25-75 Pre: 0.93 L/sec
FEF2575-%PRED-PRE: 61 %
FEV1-%Pred-Pre: 60 %
FEV1-PRE: 1.33 L
FEV1FVC-%Pred-Pre: 99 %
FEV6-%PRED-PRE: 64 %
FEV6-Pre: 1.82 L
FEV6FVC-%Pred-Pre: 104 %
FVC-%Pred-Pre: 62 %
FVC-Pre: 1.84 L
PRE FEV1/FVC RATIO: 72 %
Pre FEV6/FVC Ratio: 99 %

## 2016-05-20 NOTE — Progress Notes (Signed)
Test reviewed.  

## 2016-05-20 NOTE — Progress Notes (Signed)
Subjective:    Patient ID: Pamela [name], female    DOB: 05-18-1933, 82 y.o.   MRN: [id number]  C.C.:  Follow-up for ILD & GERD.  HPI ILD:  Upper lobe predominant. Serum autoimmune workup is negative but does have family history of interstitial lung disease. She reports she has had occasional dyspnea at times but feels it is fluctuating slightly. She doe feel fatigue contributes to her dyspnea at the end of the day. She reports she did have some sinus congestion & drainage after Thanksgiving that has resolved. Minimal to no recent coughing. No wheezing.   GERD:  Not currently on medication. She reports she is having some mild reflux once weekly. No morning brash water taste. She reports she has refluxed into the back of her throat lately.   Review of Systems No chest pain or tightness. No fever, chills, or sweats. No rashes or bruising. Does bruise easily.   Allergies  Allergen Reactions  . Cephalosporins     Cefdinir  ( per Dr. Gara Kroner. [place] )  . Demerol   . Ibuprofen Swelling  . Meperidine Nausea Only  . Seasonal Ic [Cholestatin]     Current Outpatient Prescriptions on File Prior to Visit  Medication Sig Dispense Refill  . acetaminophen (TYLENOL) 325 MG tablet Take 325 mg by mouth every 6 (six) hours as needed for pain.    Marland Kitchen albuterol (PROVENTIL HFA;VENTOLIN HFA) 108 (90 BASE) MCG/ACT inhaler Inhale 2 puffs into the lungs every 4 (four) hours as needed for wheezing.    Marland Kitchen alendronate (FOSAMAX) 35 MG tablet Take 35 mg by mouth every 7 (seven) days. Take with a full glass of water on an empty stomach.    . celecoxib (CELEBREX) 200 MG capsule Take 200 mg by mouth daily.     . fluticasone (FLONASE) 50 MCG/ACT nasal spray Place 2 sprays into the nose as needed.     . furosemide (LASIX) 20 MG tablet as needed.     Marland Kitchen levothyroxine (SYNTHROID, LEVOTHROID) 88 MCG tablet Take 88 mcg by mouth daily.      . meclizine (ANTIVERT) 25 MG tablet Take 25 mg by mouth daily.    . Multiple  Vitamin (MULTIVITAMIN) tablet Take 1 tablet by mouth daily.    . polyethylene glycol (MIRALAX / GLYCOLAX) packet Take 17 g by mouth as needed.    . Vitamin D, Ergocalciferol, (DRISDOL) 50000 units CAPS capsule Take 50,000 Units by mouth. Once a month     No current facility-administered medications on file prior to visit.     Past Medical History:  Diagnosis Date  . Allergy    Rhinitis  . Arthritis    Osteo-athritis  . Cancer ([place])    Right breast  . Cancer ([place])    BCC -  skin  Right Cal  . DVT (deep venous thrombosis) ([facility])    Thrombophlebitis Left  . Family history of breast cancer   . Family history of colon cancer   . Family history of ovarian cancer   . Leg pain   . Peripheral vascular disease ([place])   . Phlebitis   . Thyroid disease    Hypo-  . Vertigo     Past Surgical History:  Procedure Laterality Date  . BREAST LUMPECTOMY  2007   Right lumpectomy with radiation  . CYSTOSCOPY     For lesion of the urethra  . DEBRIDEMENT TENNIS ELBOW     Right elbow   . ENDOVENOUS ABLATION SAPHENOUS VEIN  W/ LASER  07-29-2011   left greater saphenous vein  . excision of basal cell carcinoma  09-23-2011   right calf  . FOOT SURGERY    . HEMORRHOID SURGERY    . Thyroid ablation  1980  . TOE ARTHROPLASTY Right    5th Toe -  Hammer Toe  . TONSILLECTOMY     and Adenoids     Family History  Problem Relation Age of Onset  . Cancer Sister     breast cancer, vocal cord and parthyroid cancer  . Pulmonary fibrosis Mother   . Ovarian cancer Maternal Aunt 60  . Colon cancer Maternal Uncle   . Cancer Paternal Aunt     NOS  . Colon cancer Paternal Uncle     dx in his 7s  . Breast cancer Maternal Aunt 80  . Cancer Maternal Uncle     NOS  . Breast cancer Cousin 84    maternal first cousin  . Cancer Cousin     cancer in her arm  . Rheumatologic disease Neg Hx     Social History   Social History  . Marital status: Married    Spouse name: N/A  . Number of children: 3    . Years of education: N/A   Occupational History  . homemaker    Social History Main Topics  . Smoking status: Passive Smoke Exposure - Never Smoker  . Smokeless tobacco: Never Used     Comment: Father & Husband smoked  . Alcohol use No     Comment: Occasional glass of wine  . Drug use: No  . Sexual activity: Not Currently    Birth control/ protection: Post-menopausal   Other Topics Concern  . Not on file   Social History Narrative   Originally from [place]. Previously did live in [place]. 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 No pets currently. No bird exposure. No mold or hot tub exposure. Enjoys reading. Previously enjoyed golfing but stopped with "leg problems". She does enjoy volunteering.       Objective:   Physical Exam BP 114/68 (BP Location: Left Arm, Patient Position: Sitting, Cuff Size: Normal)   Pulse (!) 55   Temp 98.1 F (36.7 C) (Oral)   Ht 5' 7.25" (1.708 m)   Wt 148 lb (67.1 kg)   SpO2 97%   BMI 23.01 kg/m  General:  Awake. Alert. Comfortable.   Integument:  Warm & dry. No rash on exposed skin.  Lymphatics:  No appreciated cervical or supraclavicular lymphadenoapthy. HEENT:  Moist mucus membranes. Mild bilateral nasal turbinate swelling. No scleral icterus or injection. Cardiovascular:  Regular rhythm. Normal S1 & S2. Pitting lower extremity edema with compression stockings unchanged.   Pulmonary: Previously noted bilateral squeaks were not present today. Good aeration bilaterally & clear with auscultation. Abdomen: Soft. Normal bowel sounds. Nontender. Musculoskeletal: No joint effusion appreciated. Chronic and mild synovial thickening of bilateral PIP and DIP joints.  PFT 05/20/16: FVC 1.84 L (62%) FEV1 1.33 L (60%) FEV1/FVC 0.72 FEF 25-75 0.93 L (61%)  DLCO corrected 55% (Hgb  12.2) 02/11/16: FVC 1.76 L (59%) FEV1 1.27 L (57%) FEV1/FVC 0.72 FEF 25-75 0.84 L (54%) negative bronchodilator response TLC 4.51 L (81%) RV 101% ERV 172% DLCO corrected 49% (Hgb 11.3)  6MWT 05/20/16:  Walked 304 meters / Baseline Sat 97% on RA / Nadir Sat 97% on RA @ end of test 02/18/16:  Walked 324 meters / Baseline Sat 98% on RA / Nadir Sat 97% on RA @ end of test  IMAGING HRCT CHEST W/O 01/06/16 (previously reviewed by me): Mid and upper lung zone predominant subpleural reticulation with mild traction bronchiectasis. No honeycombing changes. Subcentimeter nodules within right lower lobe with some surrounding ground glass. There is a continuation. No pleural effusion or thickening. No pericardial effusion. No pathologic mediastinal adenopathy.  CXR PA/LAT 11/15/15 (per radiologist):  Normal heart size w/ biapical scarring. Mildly diffuse increased interstitial markings. Old left healed rib fractures. No mass or effusion. Persistent right basilar density. Thoracic compression fractures.   CT CHEST W/O 04/27/12 (previously reviewed by me): No pleural effusion. Apical predominate subpleural reticulation and opacification consistent with fibrotic change. No other parenchymal opacity or nodule appreciated. Borderline precarinal lymphadenopathy. No pericardial effusion.  LABS 12/31/15 ESR: 11 CRP: 0.1 Hypersensitivity pneumonitis panel: Negative ANA: Negative Anti-Jo 1:  <0.2 Centromere antibody screen:  <0.2 Anti-CCP:  <16 Rheumatoid factor:  <10 Double-stranded DNA antibody: 1 Smith antibody:  <0.2 Chromagen antibody:  <0.2 SCL 70:  <0.2 RNP antibody:  <0.2 SSA:  <0.2 SSB:  <0.2  11/19/15 CBC:  5.9/13.6/40.9/207 BMP:  140/4.1/105/25/18/0.81/95/9.5 LFT:  3.8/6.8/0.7/59/10/15 BNP:  77.9    Assessment & Plan:  82 y.o. female with ILD that his upper lobe predominant and a family history of pulmonary fibrosis along with GERD. Patient's reflux seems to be more frequent lately. I did spend  a significant amount time today educating the patient on potential worsening of her underlying interstitial lung disease with uncontrolled reflux. This is likely due to her recent dietary changes around the holiday season. Her spirometry today has improved slightly and certainly has at least remained stable since previous testing. When coupled with her continued stability in walk test distance and absence of oxygen requirement or significant desaturation I do not feel that repeat CT imaging, surgical lung biopsy, or initiation of immunosuppression as treatment for her interstitial lung disease is necessary at this time. I instructed the patient to notify my office if she had any new breathing problems as it would be happy to see her sooner if needed.  1. ILD: Continuing to hold on surgical lung biopsy and immunosuppression. Repeat spirometry with DLCO and 6 minute walk test on room air at next appointment.GERD: Patient recommended to try Zantac/ranitidine 75 mg by mouth daily at bedtime for symptom control. 2. Health Maintenance:  S/P Pneumovax 22 June 2013 & Prevnar September 2017. Administering regular dose influenza vaccine today. 3. Follow-up: Patient to return to clinic in 3 months or sooner if needed.  Sonia Baller Ashok Cordia, M.D. [facility] Pager:  [phone] After 3pm or if no response, call [phone] 4:26 PM 05/20/16

## 2016-05-20 NOTE — Patient Instructions (Addendum)
   We need to try to ensure you have absolutely no reflux. This could make your lung disease worse.  Try taking Zantac (Ranitidine) 75mg  at night before bed routinely to help combat the reflux.  Call me if you have any new breathing problems or questions before your next appointment.  I will see you back in 3 months or sooner if needed.  TESTS ORDERED: 1. Spirometry with DLCO at next appointment 2. 6MWT on room air at next appointment

## 2016-05-21 ENCOUNTER — Encounter: Payer: Self-pay | Admitting: Vascular Surgery

## 2016-06-02 DIAGNOSIS — E538 Deficiency of other specified B group vitamins: Secondary | ICD-10-CM | POA: Diagnosis not present

## 2016-06-08 ENCOUNTER — Encounter: Payer: Self-pay | Admitting: Vascular Surgery

## 2016-06-08 ENCOUNTER — Ambulatory Visit (INDEPENDENT_AMBULATORY_CARE_PROVIDER_SITE_OTHER): Payer: Medicare Other | Admitting: Vascular Surgery

## 2016-06-08 VITALS — BP 150/80 | HR 55 | Temp 97.3°F | Resp 16 | Ht 67.0 in | Wt 145.0 lb

## 2016-06-08 DIAGNOSIS — M7989 Other specified soft tissue disorders: Secondary | ICD-10-CM | POA: Diagnosis not present

## 2016-06-08 NOTE — Progress Notes (Signed)
[facility] [place]  Patient name: Pamela [name] MRN: [id number] DOB: [birth year] Sex: female  REASON FOR VISIT: Reevaluation for lower extremity swelling  HPI: Pamela [name] is a 83 y.o. female here today for evaluation of lower jimmy swelling.  She is well known to me from a prior left saphenous vein ablation.  She was seen by her podiatrist and was concern regarding healing for any potential need for surgery regarding nail bed.  She reports that she does wear compression quite regularly but despite this does continue to have progressive swelling mostly in her right leg but can have some left leg swelling as well.  Does have discomfort with this wound up for prolonged periods of time.  She does have orthopedic issues and reports that she has a diminished swelling after injections into her joints. Loss to explain why this would be the case.  Past Medical History:  Diagnosis Date  . Allergy    Rhinitis  . Arthritis    Osteo-athritis  . Cancer ([place])    Right breast  . Cancer ([place])    BCC -  skin  Right Cal  . DVT (deep venous thrombosis) (HCC)    Thrombophlebitis Left  . Family history of breast cancer   . Family history of colon cancer   . Family history of ovarian cancer   . Leg pain   . Peripheral vascular disease ([place])   . Phlebitis   . Thyroid disease    Hypo-  . Vertigo     Family History  Problem Relation Age of Onset  . Cancer Sister     breast cancer, vocal cord and parthyroid cancer  . Pulmonary fibrosis Mother   . Ovarian cancer Maternal Aunt 60  . Colon cancer Maternal Uncle   . Cancer Paternal Aunt     NOS  . Colon cancer Paternal Uncle     dx in his 34s  . Breast cancer Maternal Aunt 80  . Cancer Maternal Uncle     NOS  . Breast cancer Cousin 9    maternal first cousin  . Cancer Cousin     cancer in her arm  . Rheumatologic disease Neg Hx     SOCIAL HISTORY: Social History    Substance Use Topics  . Smoking status: Passive Smoke Exposure - Never Smoker  . Smokeless tobacco: Never Used     Comment: Father & Husband smoked  . Alcohol use No     Comment: Occasional glass of wine    Allergies  Allergen Reactions  . Cephalosporins     Cefdinir  ( per Dr. Gara Kroner. [place] )  . Demerol   . Ibuprofen Swelling  . Meperidine Nausea Only  . Seasonal Ic [Cholestatin]     Current Outpatient Prescriptions  Medication Sig Dispense Refill  . acetaminophen (TYLENOL) 325 MG tablet Take 325 mg by mouth every 6 (six) hours as needed for pain.    Marland Kitchen albuterol (PROVENTIL HFA;VENTOLIN HFA) 108 (90 BASE) MCG/ACT inhaler Inhale 2 puffs into the lungs every 4 (four) hours as needed for wheezing.    Marland Kitchen alendronate (FOSAMAX) 35 MG tablet Take 35 mg by mouth every 7 (seven) days. Take with a full glass of water on an empty stomach.    . celecoxib (CELEBREX) 200 MG capsule Take 200 mg by mouth daily.     . fluticasone (FLONASE) 50 MCG/ACT nasal spray Place 2 sprays into the nose as needed.     Marland Kitchen  furosemide (LASIX) 20 MG tablet as needed.     Marland Kitchen levothyroxine (SYNTHROID, LEVOTHROID) 88 MCG tablet Take 88 mcg by mouth daily.      . meclizine (ANTIVERT) 25 MG tablet Take 25 mg by mouth 2 (two) times daily as needed.     . Multiple Vitamin (MULTIVITAMIN) tablet Take 1 tablet by mouth daily.    . polyethylene glycol (MIRALAX / GLYCOLAX) packet Take 17 g by mouth as needed.    . Vitamin D, Ergocalciferol, (DRISDOL) 50000 units CAPS capsule Take 50,000 Units by mouth. Once a month     No current facility-administered medications for this visit.     REVIEW OF SYSTEMS:  [X]  denotes positive finding, [ ]  denotes negative finding Cardiac  Comments:  Chest pain or chest pressure:    Shortness of breath upon exertion:    Short of breath when lying flat:    Irregular heart rhythm:        Vascular    Pain in calf, thigh, or hip brought on by ambulation:    Pain in feet at night that wakes  you up from your sleep:     Blood clot in your veins:    Leg swelling:  x         PHYSICAL EXAM: Vitals:   06/08/16 1050 06/08/16 1054  BP: (!) 169/79 (!) 150/80  Pulse: (!) 55   Resp: 16   Temp: 97.3 F (36.3 C)   TempSrc: Oral   SpO2: 98%   Weight: 145 lb (65.8 kg)   Height: 5\' 7"  (1.702 m)     GENERAL: The patient is a well-nourished female, in no acute distress. The vital signs are documented above. CARDIOVASCULAR: 2+ radial pulses bilaterally easily palpable 2+ cells pedis pulses bilaterally PULMONARY: There is good air exchange  MUSCULOSKELETAL: There are no major deformities or cyanosis. NEUROLOGIC: No focal weakness or paresthesias are detected. SKIN: There are no ulcers or rashes noted. pitting edema more so on the right calf and onto her foot than on her left  PSYCHIATRIC: The patient has a normal affect.  DATA:   she does not have venous duplex as our last visit with her in May 2016.  At this time she had no evidence of reflux in her saphenous vein. I did reimage her right and left leg with SonoSite ultrasound today and this does not appear to show any dilatation of her right great saphenous vein.  Her left great saphenous vein is closed   MEDICAL ISSUES:  persistent swelling right greater than left with no clear etiology.  Says.  I have has some component of lymphedema as well.  I explained the didn't would not recommend any further evaluation for ablation since he did not feel this would give her any benefit with no dilatation or saphenous vein.  Recommend continued elevation and compression.  She will see [place] again on an as-needed basis.  I did explain that she has normal arterial flow to her feet and there would be no contraindication to any potential podiatry surgery    Rosetta Posner, MD FACS [facility] Tel [phone] Pager [phone]

## 2016-06-10 DIAGNOSIS — M1711 Unilateral primary osteoarthritis, right knee: Secondary | ICD-10-CM | POA: Diagnosis not present

## 2016-06-10 DIAGNOSIS — G8929 Other chronic pain: Secondary | ICD-10-CM | POA: Diagnosis not present

## 2016-07-15 ENCOUNTER — Ambulatory Visit (INDEPENDENT_AMBULATORY_CARE_PROVIDER_SITE_OTHER): Payer: Medicare Other | Admitting: Sports Medicine

## 2016-07-15 ENCOUNTER — Encounter: Payer: Self-pay | Admitting: Sports Medicine

## 2016-07-15 DIAGNOSIS — M79672 Pain in left foot: Secondary | ICD-10-CM

## 2016-07-15 DIAGNOSIS — M792 Neuralgia and neuritis, unspecified: Secondary | ICD-10-CM | POA: Diagnosis not present

## 2016-07-15 DIAGNOSIS — B351 Tinea unguium: Secondary | ICD-10-CM | POA: Diagnosis not present

## 2016-07-15 DIAGNOSIS — I739 Peripheral vascular disease, unspecified: Secondary | ICD-10-CM | POA: Diagnosis not present

## 2016-07-15 DIAGNOSIS — M79671 Pain in right foot: Secondary | ICD-10-CM | POA: Diagnosis not present

## 2016-07-15 NOTE — Progress Notes (Signed)
Patient ID: Pamela [name], female   DOB: 12-12-1932, 83 y.o.   MRN: [id number]  Subjective: Pamela [name] is a 83 y.o. female patient who presents to office for follow-up evaluation of sharp Right>left great toe pain at nail margins, states that nails have not bothered her since she has been soaking and using skin so soft solution. States that she is also here for discussion of the results of her Vascular studies. Denies any other pedal complaints at this time.   Patient Active Problem List   Diagnosis Date Noted  . ILD (interstitial lung disease) ([place]) 12/31/2015  . Wheeze 12/31/2015  . GERD (gastroesophageal reflux disease) 12/31/2015  . Arthritis 12/31/2015  . Hypothyroidism 12/31/2015  . Family history of breast cancer   . Family history of ovarian cancer   . Family history of colon cancer   . Genetic testing 03/18/2015  . Arthritis of knee, degenerative 12/16/2014  . Gonalgia 11/28/2014  . Unspecified erythematous condition 04/20/2012  . Swelling of limb 04/20/2012  . Pain in limb 04/20/2012  . History of breast cancer 01/19/2012  . Varicose veins of lower extremities with other complications [id number]  . Leg swelling 02/25/2011  . Hyperthyroidism 05/31/1978    Current Outpatient Prescriptions on File Prior to Visit  Medication Sig Dispense Refill  . acetaminophen (TYLENOL) 325 MG tablet Take 325 mg by mouth every 6 (six) hours as needed for pain.    Marland Kitchen albuterol (PROVENTIL HFA;VENTOLIN HFA) 108 (90 BASE) MCG/ACT inhaler Inhale 2 puffs into the lungs every 4 (four) hours as needed for wheezing.    Marland Kitchen alendronate (FOSAMAX) 35 MG tablet Take 35 mg by mouth every 7 (seven) days. Take with a full glass of water on an empty stomach.    . celecoxib (CELEBREX) 200 MG capsule Take 200 mg by mouth daily.     . fluticasone (FLONASE) 50 MCG/ACT nasal spray Place 2 sprays into the nose as needed.     . furosemide (LASIX) 20 MG tablet as needed.     Marland Kitchen levothyroxine (SYNTHROID,  LEVOTHROID) 88 MCG tablet Take 88 mcg by mouth daily.      . meclizine (ANTIVERT) 25 MG tablet Take 25 mg by mouth 2 (two) times daily as needed.     . Multiple Vitamin (MULTIVITAMIN) tablet Take 1 tablet by mouth daily.    . polyethylene glycol (MIRALAX / GLYCOLAX) packet Take 17 g by mouth as needed.    . Vitamin D, Ergocalciferol, (DRISDOL) 50000 units CAPS capsule Take 50,000 Units by mouth. Once a month     No current facility-administered medications on file prior to visit.     Allergies  Allergen Reactions  . Cephalosporins     Cefdinir  ( per Dr. Gara [name]. Pamela [name] )  . Demerol   . Ibuprofen Swelling  . Meperidine Nausea Only  . Seasonal Ic [Cholestatin]     Objective:  General: Alert and oriented x3 in no acute distress  Dermatology: No open lesions bilateral lower extremities, no webspace macerations, no ecchymosis bilateral, all nails x 10 are well manicured but at left and right hallux nail there is distal lifting and subungal debris with mild ingrowing at medial margin with no acute signs of infection, currently asymptomatic. Dry blood under left 4th toenail that appears to be growing out with Improvement.  Vascular: Dorsalis Pedis and Posterior Tibial pedal pulses palpable however, faint, Capillary Fill Time 5 seconds,(-) pedal hair growth bilateral,  1+ pitting edema bilateral lower extremities, Temperature gradient  mildly decreased with varicosities and dependent rubor that improves once elevation.   Neurology: Gross sensation intact via light touch bilateral, Protective sensation absentwith Semmes Weinstein Monofilament to all pedal sites, vibratory  absent bilateral, Deep tendon reflexes within normal limits bilateral, No babinski sign present bilateral. (- )Tinels sign bilateral. Sharp pain to both great toes around nail.  Musculoskeletal: There isno tenderness with palpation at Right or left great toe distal medial aspect, mild exostosis at IPJ likely suggestive of  osteoarthritis L>R, No pain with calf compression bilateral.  Pedal and ankle joints within normal limits bilateral. Strength within normal limits in all groups bilateral.   Assessment and Plan: Problem List Items Addressed This Visit      Other   Pain in limb    Other Visit Diagnoses    Onychomycosis    -  Primary   with ingrowing. Currently asymptomatic   Neuritis       PVD (peripheral vascular disease) (HCC)          -Complete examination performed -Vein and vascular studies reviewed with patient; good arterial flow to heal. Any  Necessary nail surgery -Patient declines procedure since nails are doing better and are not hurting  -Continue with foam padding and Topical pain cream from [employer] to use at great toes as needed -Cont with compression stockings and encouraged elevation of lower extremities to assist with edema control - Recommend good supportive shoes daily -Continue with Celebrex for osteoarthritis which she is on for significant arthritis of her knees -Patient to return to office in 1 month for nail trim or sooner if condition worsens.  Pamela [name], DPM

## 2016-07-19 ENCOUNTER — Other Ambulatory Visit: Payer: Self-pay | Admitting: Family Medicine

## 2016-07-19 DIAGNOSIS — Z1231 Encounter for screening mammogram for malignant neoplasm of breast: Secondary | ICD-10-CM

## 2016-07-20 ENCOUNTER — Other Ambulatory Visit: Payer: Self-pay | Admitting: Family Medicine

## 2016-07-20 DIAGNOSIS — N644 Mastodynia: Secondary | ICD-10-CM

## 2016-07-20 DIAGNOSIS — Z1231 Encounter for screening mammogram for malignant neoplasm of breast: Secondary | ICD-10-CM

## 2016-07-20 DIAGNOSIS — N6320 Unspecified lump in the left breast, unspecified quadrant: Secondary | ICD-10-CM | POA: Diagnosis not present

## 2016-07-20 DIAGNOSIS — M545 Low back pain: Secondary | ICD-10-CM | POA: Diagnosis not present

## 2016-07-20 DIAGNOSIS — M810 Age-related osteoporosis without current pathological fracture: Secondary | ICD-10-CM

## 2016-07-22 ENCOUNTER — Other Ambulatory Visit: Payer: Medicare Other

## 2016-07-27 ENCOUNTER — Ambulatory Visit
Admission: RE | Admit: 2016-07-27 | Discharge: 2016-07-27 | Disposition: A | Discharge status: Home / Self Care | Payer: Medicare Other | Source: Ambulatory Visit | Attending: Family Medicine | Admitting: Family Medicine

## 2016-07-27 DIAGNOSIS — N644 Mastodynia: Secondary | ICD-10-CM | POA: Diagnosis not present

## 2016-07-27 HISTORY — DX: Malignant neoplasm of unspecified site of unspecified female breast: C50.919

## 2016-07-27 HISTORY — DX: Personal history of irradiation: Z92.3

## 2016-08-04 ENCOUNTER — Other Ambulatory Visit: Payer: Self-pay | Admitting: Orthopaedic Surgery

## 2016-08-04 DIAGNOSIS — M5136 Other intervertebral disc degeneration, lumbar region: Secondary | ICD-10-CM

## 2016-08-04 DIAGNOSIS — M419 Scoliosis, unspecified: Secondary | ICD-10-CM | POA: Diagnosis not present

## 2016-08-04 DIAGNOSIS — M791 Myalgia: Secondary | ICD-10-CM | POA: Diagnosis not present

## 2016-08-04 DIAGNOSIS — M545 Low back pain: Secondary | ICD-10-CM | POA: Diagnosis not present

## 2016-08-04 DIAGNOSIS — M47816 Spondylosis without myelopathy or radiculopathy, lumbar region: Secondary | ICD-10-CM

## 2016-08-04 DIAGNOSIS — M47896 Other spondylosis, lumbar region: Secondary | ICD-10-CM | POA: Diagnosis not present

## 2016-08-04 DIAGNOSIS — M549 Dorsalgia, unspecified: Secondary | ICD-10-CM | POA: Diagnosis not present

## 2016-08-17 ENCOUNTER — Ambulatory Visit
Admission: RE | Admit: 2016-08-17 | Discharge: 2016-08-17 | Disposition: A | Discharge status: Home / Self Care | Payer: Medicare Other | Source: Ambulatory Visit | Attending: Orthopaedic Surgery | Admitting: Orthopaedic Surgery

## 2016-08-17 DIAGNOSIS — M47816 Spondylosis without myelopathy or radiculopathy, lumbar region: Secondary | ICD-10-CM

## 2016-08-17 DIAGNOSIS — M48061 Spinal stenosis, lumbar region without neurogenic claudication: Secondary | ICD-10-CM | POA: Diagnosis not present

## 2016-08-17 DIAGNOSIS — M5136 Other intervertebral disc degeneration, lumbar region: Secondary | ICD-10-CM

## 2016-08-18 ENCOUNTER — Encounter: Payer: Self-pay | Admitting: Pulmonary Disease

## 2016-08-18 ENCOUNTER — Ambulatory Visit: Payer: Medicare Other | Admitting: Sports Medicine

## 2016-08-18 ENCOUNTER — Ambulatory Visit (INDEPENDENT_AMBULATORY_CARE_PROVIDER_SITE_OTHER): Payer: Medicare Other | Admitting: Pulmonary Disease

## 2016-08-18 VITALS — BP 130/90 | HR 79 | Ht 67.0 in | Wt 141.1 lb

## 2016-08-18 DIAGNOSIS — K219 Gastro-esophageal reflux disease without esophagitis: Secondary | ICD-10-CM | POA: Diagnosis not present

## 2016-08-18 DIAGNOSIS — J849 Interstitial pulmonary disease, unspecified: Secondary | ICD-10-CM

## 2016-08-18 NOTE — Addendum Note (Signed)
Addended by: Tyson Dense on: 08/18/2016 02:19 PM   Modules accepted: Orders

## 2016-08-18 NOTE — Progress Notes (Signed)
Subjective:    Patient ID: Pamela [name], female    DOB: 30-Jun-1932, 83 y.o.   MRN: [id number]  C.C.:  Follow-up for ILD & GERD.  HPI ILD: Upper lobe predominant. Serum autoimmune workup negative but there is a family history of ulnar fibrosis. She reports she does continue to have intermittent wheezing and shallow breathing. She denies any other new breathing problems. She reports intermittent, "light" cough.   GERD: Recommended trying Zantac 75 mg by mouth daily at bedtime at last appointment. She denies any morning brash water taste. Reports improved reflux & dyspepsia on Zantac.   Review of Systems She only feels her chest is tight with her inability to take a deep breath. She denies any other chest pain or pressure. She has had more pain in her back and has been referred to a back expert. She has been taking Aleve for her back pain which has helped along with an injection. No fever or chills.   Allergies  Allergen Reactions  . Cephalosporins     Cefdinir  ( per Dr. Gara Kroner. [place] )  . Demerol   . Ibuprofen Swelling  . Meperidine Nausea Only  . Seasonal Ic [Cholestatin]     Current Outpatient Prescriptions on File Prior to Visit  Medication Sig Dispense Refill  . acetaminophen (TYLENOL) 325 MG tablet Take 325 mg by mouth every 6 (six) hours as needed for pain.    Marland Kitchen albuterol (PROVENTIL HFA;VENTOLIN HFA) 108 (90 BASE) MCG/ACT inhaler Inhale 2 puffs into the lungs every 4 (four) hours as needed for wheezing.    Marland Kitchen alendronate (FOSAMAX) 35 MG tablet Take 35 mg by mouth every 7 (seven) days. Take with a full glass of water on an empty stomach.    . celecoxib (CELEBREX) 200 MG capsule Take 200 mg by mouth daily.     . fluticasone (FLONASE) 50 MCG/ACT nasal spray Place 2 sprays into the nose as needed.     Marland Kitchen levothyroxine (SYNTHROID, LEVOTHROID) 88 MCG tablet Take 88 mcg by mouth daily.      . Multiple Vitamin (MULTIVITAMIN) tablet Take 1 tablet by mouth daily.    . polyethylene  glycol (MIRALAX / GLYCOLAX) packet Take 17 g by mouth as needed.    . Vitamin D, Ergocalciferol, (DRISDOL) 50000 units CAPS capsule Take 50,000 Units by mouth. Once a month    . furosemide (LASIX) 20 MG tablet as needed.     . meclizine (ANTIVERT) 25 MG tablet Take 25 mg by mouth 2 (two) times daily as needed.      No current facility-administered medications on file prior to visit.     Past Medical History:  Diagnosis Date  . Allergy    Rhinitis  . Arthritis    Osteo-athritis  . Breast cancer ([place])   . Cancer ([place])    Right breast  . Cancer ([place])    BCC -  skin  Right Cal  . DVT (deep venous thrombosis) (HCC)    Thrombophlebitis Left  . Family history of breast cancer   . Family history of colon cancer   . Family history of ovarian cancer   . Leg pain   . Peripheral vascular disease ([place])   . Personal history of radiation therapy    right breast 09/2005  . Phlebitis   . Thyroid disease    Hypo-  . Vertigo     Past Surgical History:  Procedure Laterality Date  . BREAST LUMPECTOMY  2007   Right  lumpectomy with radiation  . CYSTOSCOPY     For lesion of the urethra  . DEBRIDEMENT TENNIS ELBOW     Right elbow   . ENDOVENOUS ABLATION SAPHENOUS VEIN W/ LASER  07-29-2011   left greater saphenous vein  . excision of basal cell carcinoma  09-23-2011   right calf  . FOOT SURGERY    . HEMORRHOID SURGERY    . Thyroid ablation  1980  . TOE ARTHROPLASTY Right    5th Toe -  Hammer Toe  . TONSILLECTOMY     and Adenoids     Family History  Problem Relation Age of Onset  . Cancer Sister     breast cancer, vocal cord and parthyroid cancer  . Breast cancer Sister   . Pulmonary fibrosis Mother   . Ovarian cancer Maternal Aunt 60  . Colon cancer Maternal Uncle   . Cancer Paternal Aunt     NOS  . Colon cancer Paternal Uncle     dx in his 90s  . Breast cancer Maternal Aunt 80  . Cancer Maternal Uncle     NOS  . Breast cancer Cousin 64    maternal first cousin  . Cancer  Cousin     cancer in her arm  . Rheumatologic disease Neg Hx     Social History   Social History  . Marital status: Married    Spouse name: N/A  . Number of children: 3  . Years of education: N/A   Occupational History  . homemaker    Social History Main Topics  . Smoking status: Passive Smoke Exposure - Never Smoker  . Smokeless tobacco: Never Used     Comment: Father & Husband smoked  . Alcohol use No     Comment: Occasional glass of wine  . Drug use: No  . Sexual activity: Not Currently    Birth control/ protection: Post-menopausal   Other Topics Concern  . None   Social History Narrative   Originally from [place]. Previously did live in [place]. 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 No pets currently. No bird exposure. No mold or hot tub exposure. Enjoys reading. Previously enjoyed golfing but stopped with "leg problems". She does enjoy volunteering.       Objective:   Physical Exam BP 130/90 (BP Location: Left Arm, Patient Position: Sitting, Cuff Size: Normal)   Pulse 79   Ht _0  (1.702 m)   Wt 141 lb 1.6 oz (64 kg)   SpO2 99%   BMI 22.10 kg/m   Gen.: Elderly female. No distress. Comfortable. Integument: No rash or bruising on exposed skin. Warm and dry. HEENT: No scleral icterus. Moist mucous membranes. Minimal bilateral nasal turbinate swelling. Cardiovascular: Continued lower extremity edema. Regular rate and rhythm. Pulmonary: No squeaks or wheezing appreciated. Clear with auscultation. No accessory muscle use on room air. Abdomen: Soft. Nondistended. Nontender. Musculoskeletal: Kyphosis and scoliosis noted. No joint effusion appreciated.  PFT 05/20/16: FVC 1.84 L (62%) FEV1 1.33 L (60%) FEV1/FVC 0.72 FEF 25-75 0.93 L (61%)  DLCO corrected 55% (Hgb 12.2) 02/11/16: FVC 1.76 L (59%)  FEV1 1.27 L (57%) FEV1/FVC 0.72 FEF 25-75 0.84 L (54%) negative bronchodilator response TLC 4.51 L (81%) RV 101% ERV 172% DLCO corrected 49% (Hgb 11.3)  6MWT 05/20/16:  Walked 304 meters / Baseline Sat 97% on RA / Nadir Sat 97% on RA @ end of test 02/18/16:  Walked 324 meters / Baseline Sat 98% on RA / Nadir Sat 97% on RA @ end of test  IMAGING HRCT CHEST W/O 01/06/16 (previously reviewed by me): Mid and upper lung zone predominant subpleural reticulation with mild traction bronchiectasis. No honeycombing changes. Subcentimeter nodules within right lower lobe with some surrounding ground glass. There is a continuation. No pleural effusion or thickening. No pericardial effusion. No pathologic mediastinal adenopathy.  CXR PA/LAT 11/15/15 (per radiologist):  Normal heart size w/ biapical scarring. Mildly diffuse increased interstitial markings. Old left healed rib fractures. No mass or effusion. Persistent right basilar density. Thoracic compression fractures.   CT CHEST W/O 04/27/12 (previously reviewed by me): No pleural effusion. Apical predominate subpleural reticulation and opacification consistent with fibrotic change. No other parenchymal opacity or nodule appreciated. Borderline precarinal lymphadenopathy. No pericardial effusion.  LABS 12/31/15 ESR: 11 CRP: 0.1 Hypersensitivity pneumonitis panel: Negative ANA: Negative Anti-Jo 1:  <0.2 Centromere antibody screen:  <0.2 Anti-CCP:  <16 Rheumatoid factor:  <10 Double-stranded DNA antibody: 1 Smith antibody:  <0.2 Chromagen antibody:  <0.2 SCL 70:  <0.2 RNP antibody:  <0.2 SSA:  <0.2 SSB:  <0.2  11/19/15 CBC:  5.9/13.6/40.9/207 BMP:  140/4.1/105/25/18/0.81/95/9.5 LFT:  3.8/6.8/0.7/59/10/15 BNP:  77.9    Assessment & Plan:  83 y.o. female with underlying interstitial lung disease & GERD. Known family history of pulmonary fibrosis. Overall patient's degree of dyspnea and symptoms from her underlying interstitial lung disease seem to  be stable. We will need to follow pulmonary function closely to ensure that there is no sign of progression. I instructed the patient to notify me if she had any new breathing problems before her next appointment. Her reflux is better controlled on her current dose of Zantac and I encouraged her to continue with this medication. I instructed the patient contact my office if she had any questions or concerns.  1. ILD: Continuing to hold on immunosuppression. Repeat spirometry with DLCO and 6 walk test on room air at next appointment. 2. GERD: Continuing Zantac 75 mg by mouth daily at bedtime. 3. Health maintenance: Status post Influenza vaccine December 2017, Pneumovax 22 June 2013 & Prevnar September 2017. 4. Follow-up: Return to clinic in 2 months or sooner if needed.  Sonia Baller Ashok Cordia, M.D. [facility] Pager:  [phone] After 3pm or if no response, call [phone] 1:57 PM 08/18/16

## 2016-08-18 NOTE — Patient Instructions (Signed)
   Notify me if you have any new breathing problems or questions before your next appointment.  You will have a breathing & walking test when I see you back.  Keep taking your medications as prescribed.  TESTS ORDERED: 1. Spirometry with DLCO at next appointment 2. 6MWT on room air at next appointment

## 2016-08-20 ENCOUNTER — Encounter: Payer: Self-pay | Admitting: Sports Medicine

## 2016-08-20 ENCOUNTER — Ambulatory Visit (INDEPENDENT_AMBULATORY_CARE_PROVIDER_SITE_OTHER): Payer: Medicare Other | Admitting: Sports Medicine

## 2016-08-20 DIAGNOSIS — G5793 Unspecified mononeuropathy of bilateral lower limbs: Secondary | ICD-10-CM

## 2016-08-20 DIAGNOSIS — B351 Tinea unguium: Secondary | ICD-10-CM

## 2016-08-20 DIAGNOSIS — M79671 Pain in right foot: Secondary | ICD-10-CM

## 2016-08-20 DIAGNOSIS — M79672 Pain in left foot: Secondary | ICD-10-CM

## 2016-08-20 DIAGNOSIS — I739 Peripheral vascular disease, unspecified: Secondary | ICD-10-CM

## 2016-08-20 NOTE — Progress Notes (Signed)
Subjective: KHARMA SAMPSEL is a 83 y.o. female patient seen today in office with complaint of painful thickened and elongated toenails; unable to trim. Patient denies any changes with medical history and medications since last visit, Reports no issues with her ingrown toenails at this time. Reports that she has a MRI appt and a pulmonary appt coming up. Patient has no other pedal complaints at this time.   Patient Active Problem List   Diagnosis Date Noted  . Right hip pain 05/18/2016  . ILD (interstitial lung disease) ([place]) 12/31/2015  . Wheeze 12/31/2015  . GERD (gastroesophageal reflux disease) 12/31/2015  . Arthritis 12/31/2015  . Hypothyroidism 12/31/2015  . Family history of breast cancer   . Family history of ovarian cancer   . Family history of colon cancer   . Genetic testing 03/18/2015  . Primary osteoarthritis of right knee 12/16/2014  . Gonalgia 11/28/2014  . Arthralgia of both knees 11/28/2014  . Unspecified erythematous condition 04/20/2012  . Swelling of limb 04/20/2012  . Pain in limb 04/20/2012  . History of breast cancer 01/19/2012  . Varicose veins of lower extremities with other complications 88/28/0034  . Leg swelling 02/25/2011  . Hyperthyroidism 05/31/1978    Current Outpatient Prescriptions on File Prior to Visit  Medication Sig Dispense Refill  . acetaminophen (TYLENOL) 325 MG tablet Take 325 mg by mouth every 6 (six) hours as needed for pain.    Marland Kitchen albuterol (PROVENTIL HFA;VENTOLIN HFA) 108 (90 BASE) MCG/ACT inhaler Inhale 2 puffs into the lungs every 4 (four) hours as needed for wheezing.    Marland Kitchen alendronate (FOSAMAX) 35 MG tablet Take 35 mg by mouth every 7 (seven) days. Take with a full glass of water on an empty stomach.    . celecoxib (CELEBREX) 200 MG capsule Take 200 mg by mouth daily.     . fluticasone (FLONASE) 50 MCG/ACT nasal spray Place 2 sprays into the nose as needed.     . furosemide (LASIX) 20 MG tablet as needed.     Marland Kitchen levothyroxine  (SYNTHROID, LEVOTHROID) 88 MCG tablet Take 88 mcg by mouth daily.      . meclizine (ANTIVERT) 25 MG tablet Take 25 mg by mouth 2 (two) times daily as needed.     . Multiple Vitamin (MULTIVITAMIN) tablet Take 1 tablet by mouth daily.    . polyethylene glycol (MIRALAX / GLYCOLAX) packet Take 17 g by mouth as needed.    . Vitamin D, Ergocalciferol, (DRISDOL) 50000 units CAPS capsule Take 50,000 Units by mouth. Once a month     No current facility-administered medications on file prior to visit.     Allergies  Allergen Reactions  . Cephalosporins     Cefdinir  ( per Dr. Gara Kroner. [place] )  . Demerol   . Ibuprofen Swelling  . Meperidine Nausea Only  . Seasonal Ic [Cholestatin]     Objective: Physical Exam  General: Well developed, nourished, no acute distress, awake, alert and oriented x 3  Vascular: Dorsalis pedis artery 1/4 bilateral, Posterior tibial artery 1/4 bilateral, skin temperature warm to cool proximal to distal bilateral lower extremities, significant varicosities,no  pedal hair present bilateral.  Neurological: Gross sensation present via light touch bilateral. Protective absent bilateral.  Dermatological: Skin is warm, dry, and supple bilateral, Nails 1-10 are tender, long, thick, and discolored with mild subungal debris, no webspace macerations present bilateral, no open lesions present bilateral, no callus/corns/hyperkeratotic tissue present bilateral. No signs of infection bilateral.  Musculoskeletal: Asymptomatic hallux exostosis  boney deformities noted bilateral. Muscular strength within normal limits without painon range of motion. No pain with calf compression bilateral.  Assessment and Plan:  Problem List Items Addressed This Visit    None    Visit Diagnoses    Onychomycosis    -  Primary   PVD (peripheral vascular disease) ([place])       Neuropathy of both feet       Relevant Medications   diazepam (VALIUM) 5 MG tablet   Foot pain, bilateral           -Examined patient.  -Discussed treatment options for painful mycotic nails. -Mechanically debrided and reduced mycotic nails with sterile nail nipper and dremel nail file without incident. -Recommend continue topical pain cream  -Recommend good supportive shoes daily for foot type -Patient to return in 2.5 months for follow up evaluation or sooner if symptoms worsen.  Landis Martins, DPM

## 2016-08-23 ENCOUNTER — Ambulatory Visit
Admission: RE | Admit: 2016-08-23 | Discharge: 2016-08-23 | Disposition: A | Discharge status: Home / Self Care | Payer: Medicare Other | Source: Ambulatory Visit | Attending: Family Medicine | Admitting: Family Medicine

## 2016-08-23 DIAGNOSIS — M810 Age-related osteoporosis without current pathological fracture: Secondary | ICD-10-CM | POA: Diagnosis not present

## 2016-08-23 DIAGNOSIS — Z1231 Encounter for screening mammogram for malignant neoplasm of breast: Secondary | ICD-10-CM

## 2016-08-23 DIAGNOSIS — Z780 Asymptomatic menopausal state: Secondary | ICD-10-CM | POA: Diagnosis not present

## 2016-09-01 DIAGNOSIS — M545 Low back pain: Secondary | ICD-10-CM | POA: Diagnosis not present

## 2016-09-01 DIAGNOSIS — M47896 Other spondylosis, lumbar region: Secondary | ICD-10-CM | POA: Diagnosis not present

## 2016-09-01 DIAGNOSIS — M419 Scoliosis, unspecified: Secondary | ICD-10-CM | POA: Diagnosis not present

## 2016-09-01 DIAGNOSIS — M5136 Other intervertebral disc degeneration, lumbar region: Secondary | ICD-10-CM | POA: Diagnosis not present

## 2016-09-06 DIAGNOSIS — M170 Bilateral primary osteoarthritis of knee: Secondary | ICD-10-CM | POA: Diagnosis not present

## 2016-09-22 DIAGNOSIS — R531 Weakness: Secondary | ICD-10-CM | POA: Diagnosis not present

## 2016-09-22 DIAGNOSIS — M47896 Other spondylosis, lumbar region: Secondary | ICD-10-CM | POA: Diagnosis not present

## 2016-09-22 DIAGNOSIS — R262 Difficulty in walking, not elsewhere classified: Secondary | ICD-10-CM | POA: Diagnosis not present

## 2016-09-24 DIAGNOSIS — E039 Hypothyroidism, unspecified: Secondary | ICD-10-CM | POA: Diagnosis not present

## 2016-09-24 DIAGNOSIS — E538 Deficiency of other specified B group vitamins: Secondary | ICD-10-CM | POA: Diagnosis not present

## 2016-09-24 DIAGNOSIS — Z79899 Other long term (current) drug therapy: Secondary | ICD-10-CM | POA: Diagnosis not present

## 2016-09-24 DIAGNOSIS — I10 Essential (primary) hypertension: Secondary | ICD-10-CM | POA: Diagnosis not present

## 2016-09-24 DIAGNOSIS — Z6820 Body mass index (BMI) 20.0-20.9, adult: Secondary | ICD-10-CM | POA: Diagnosis not present

## 2016-09-29 DIAGNOSIS — R531 Weakness: Secondary | ICD-10-CM | POA: Diagnosis not present

## 2016-09-29 DIAGNOSIS — R262 Difficulty in walking, not elsewhere classified: Secondary | ICD-10-CM | POA: Diagnosis not present

## 2016-09-29 DIAGNOSIS — M47896 Other spondylosis, lumbar region: Secondary | ICD-10-CM | POA: Diagnosis not present

## 2016-10-04 DIAGNOSIS — R531 Weakness: Secondary | ICD-10-CM | POA: Diagnosis not present

## 2016-10-04 DIAGNOSIS — R262 Difficulty in walking, not elsewhere classified: Secondary | ICD-10-CM | POA: Diagnosis not present

## 2016-10-04 DIAGNOSIS — M47896 Other spondylosis, lumbar region: Secondary | ICD-10-CM | POA: Diagnosis not present

## 2016-10-07 DIAGNOSIS — M5136 Other intervertebral disc degeneration, lumbar region: Secondary | ICD-10-CM | POA: Diagnosis not present

## 2016-10-07 DIAGNOSIS — M791 Myalgia: Secondary | ICD-10-CM | POA: Diagnosis not present

## 2016-10-07 DIAGNOSIS — M47816 Spondylosis without myelopathy or radiculopathy, lumbar region: Secondary | ICD-10-CM | POA: Diagnosis not present

## 2016-10-07 DIAGNOSIS — M419 Scoliosis, unspecified: Secondary | ICD-10-CM | POA: Diagnosis not present

## 2016-10-08 DIAGNOSIS — R262 Difficulty in walking, not elsewhere classified: Secondary | ICD-10-CM | POA: Diagnosis not present

## 2016-10-08 DIAGNOSIS — R531 Weakness: Secondary | ICD-10-CM | POA: Diagnosis not present

## 2016-10-08 DIAGNOSIS — M47896 Other spondylosis, lumbar region: Secondary | ICD-10-CM | POA: Diagnosis not present

## 2016-10-11 DIAGNOSIS — R262 Difficulty in walking, not elsewhere classified: Secondary | ICD-10-CM | POA: Diagnosis not present

## 2016-10-11 DIAGNOSIS — R531 Weakness: Secondary | ICD-10-CM | POA: Diagnosis not present

## 2016-10-11 DIAGNOSIS — M47896 Other spondylosis, lumbar region: Secondary | ICD-10-CM | POA: Diagnosis not present

## 2016-10-15 DIAGNOSIS — M47896 Other spondylosis, lumbar region: Secondary | ICD-10-CM | POA: Diagnosis not present

## 2016-10-15 DIAGNOSIS — R5383 Other fatigue: Secondary | ICD-10-CM | POA: Diagnosis not present

## 2016-10-15 DIAGNOSIS — Z6821 Body mass index (BMI) 21.0-21.9, adult: Secondary | ICD-10-CM | POA: Diagnosis not present

## 2016-10-15 DIAGNOSIS — R262 Difficulty in walking, not elsewhere classified: Secondary | ICD-10-CM | POA: Diagnosis not present

## 2016-10-15 DIAGNOSIS — R531 Weakness: Secondary | ICD-10-CM | POA: Diagnosis not present

## 2016-10-15 DIAGNOSIS — Z0000 Encounter for general adult medical examination without abnormal findings: Secondary | ICD-10-CM | POA: Diagnosis not present

## 2016-10-18 DIAGNOSIS — M47896 Other spondylosis, lumbar region: Secondary | ICD-10-CM | POA: Diagnosis not present

## 2016-10-18 DIAGNOSIS — R531 Weakness: Secondary | ICD-10-CM | POA: Diagnosis not present

## 2016-10-18 DIAGNOSIS — R262 Difficulty in walking, not elsewhere classified: Secondary | ICD-10-CM | POA: Diagnosis not present

## 2016-10-20 DIAGNOSIS — M47896 Other spondylosis, lumbar region: Secondary | ICD-10-CM | POA: Diagnosis not present

## 2016-10-20 DIAGNOSIS — R262 Difficulty in walking, not elsewhere classified: Secondary | ICD-10-CM | POA: Diagnosis not present

## 2016-10-20 DIAGNOSIS — R531 Weakness: Secondary | ICD-10-CM | POA: Diagnosis not present

## 2016-10-27 DIAGNOSIS — R531 Weakness: Secondary | ICD-10-CM | POA: Diagnosis not present

## 2016-10-27 DIAGNOSIS — M47896 Other spondylosis, lumbar region: Secondary | ICD-10-CM | POA: Diagnosis not present

## 2016-10-27 DIAGNOSIS — R262 Difficulty in walking, not elsewhere classified: Secondary | ICD-10-CM | POA: Diagnosis not present

## 2016-10-29 ENCOUNTER — Ambulatory Visit (INDEPENDENT_AMBULATORY_CARE_PROVIDER_SITE_OTHER): Payer: Medicare Other | Admitting: *Deleted

## 2016-10-29 ENCOUNTER — Ambulatory Visit (INDEPENDENT_AMBULATORY_CARE_PROVIDER_SITE_OTHER): Payer: Medicare Other | Admitting: Pulmonary Disease

## 2016-10-29 ENCOUNTER — Encounter: Payer: Self-pay | Admitting: Pulmonary Disease

## 2016-10-29 VITALS — BP 140/80 | HR 95 | Ht 67.0 in | Wt 136.0 lb

## 2016-10-29 DIAGNOSIS — K219 Gastro-esophageal reflux disease without esophagitis: Secondary | ICD-10-CM

## 2016-10-29 DIAGNOSIS — J849 Interstitial pulmonary disease, unspecified: Secondary | ICD-10-CM

## 2016-10-29 DIAGNOSIS — R0602 Shortness of breath: Secondary | ICD-10-CM

## 2016-10-29 LAB — PULMONARY FUNCTION TEST
DL/VA % PRED: 85 %
DL/VA: 4.44 ml/min/mmHg/L
DLCO cor % pred: 46 %
DLCO cor: 13.39 ml/min/mmHg
DLCO unc % pred: 45 %
DLCO unc: 13.04 ml/min/mmHg
FEF 25-75 PRE: 0.83 L/s
FEF2575-%Pred-Pre: 55 %
FEV1-%Pred-Pre: 55 %
FEV1-Pre: 1.22 L
FEV1FVC-%PRED-PRE: 101 %
FEV6-%Pred-Pre: 58 %
FEV6-PRE: 1.63 L
FEV6FVC-%Pred-Pre: 104 %
FVC-%PRED-PRE: 55 %
FVC-Pre: 1.64 L
PRE FEV6/FVC RATIO: 100 %
Pre FEV1/FVC ratio: 74 %

## 2016-10-29 NOTE — Patient Instructions (Signed)
   Make sure you keep taking your Zantac 75mg  (also called Ranitidine over-the-counter) every night.  Call me if you feel your cough or breathing are getting worse.  I will see you back in 6 months or sooner if needed to allow time to help you get your pain and leg pain straightened out.   TESTS ORDERED: 1. Spirometry with DLCO at next appointment  2. 6MWT on room air at next appointment

## 2016-10-29 NOTE — Progress Notes (Signed)
PFT done today. 

## 2016-10-29 NOTE — Progress Notes (Signed)
SIX MIN WALK 10/29/2016 05/20/2016 02/18/2016  Medications Celebrex 200mg  and HCTZ 12.5mg  at 1pm // Synthroid 57mcg at 7am Levothyroxine- 9am Fosamax, Synthroid  Supplimental Oxygen during Test? (L/min) No No No  Laps 4 6 6   Partial Lap (in Meters) 9 16 36  Baseline BP (sitting) 128/82 128/70 122/74  Baseline Heartrate 73 66 62  Baseline Dyspnea (Borg Scale) 0.5 1 0  Baseline Fatigue (Borg Scale) 1 1 1   Baseline SPO2 99 97 98  BP (sitting) 142/84 126/72 140/90  Heartrate 112 90 84  Dyspnea (Borg Scale) 1 2 1   Fatigue (Borg Scale) 2 2 2   SPO2 96 97 97  BP (sitting) 142/76 126/70 132/78  Heartrate 89 55 68  SPO2 98 98 100  Stopped or Paused before Six Minutes No No No  Interpretation - - Dizziness  Distance Completed 201 304 324  Tech Comments: pt walked slow pace, no desaturations, used the forehead probe and monitored O2 the entire test. No breaks or pauses.  --amg pt. walked a slow steady pace, did well while she was walking, did not stop or take any breaks during.  -

## 2016-10-29 NOTE — Progress Notes (Signed)
Subjective:    Patient ID: Pamela [name], female    DOB: 21-Jul-1932, 83 y.o.   MRN: [id number]  C.C.:  Follow-up for ILD & GERD.  HPI ILD: Mid and upper lobe predominant. Autoimmune workup negative but there is a family history of pulmonary fibrosis. Not currently on immunosuppression. She reports she has more problems with her back pain and legs. She reports her dyspnea has remained stable. She reports a "dry, nonproductive cough" that may be slightly worse.   GERD: On Zantac at last appointment with better control of reflux. Reports her reflux is controlled and she is still taking Zantac OTC.   Review of Systems Denies any chest pain or pressure. No fever or chills. No rashes but does bruise easily.   Allergies  Allergen Reactions  . Cephalosporins     Cefdinir  ( per Dr. Gara Kroner. [place] )  . Demerol   . Ibuprofen Swelling  . Meperidine Nausea Only  . Seasonal Ic [Cholestatin]     Current Outpatient Prescriptions on File Prior to Visit  Medication Sig Dispense Refill  . acetaminophen (TYLENOL) 325 MG tablet Take 325 mg by mouth every 6 (six) hours as needed for pain.    Marland Kitchen albuterol (PROVENTIL HFA;VENTOLIN HFA) 108 (90 BASE) MCG/ACT inhaler Inhale 2 puffs into the lungs every 4 (four) hours as needed for wheezing.    Marland Kitchen alendronate (FOSAMAX) 35 MG tablet Take 35 mg by mouth every 7 (seven) days. Take with a full glass of water on an empty stomach.    . celecoxib (CELEBREX) 200 MG capsule Take 200 mg by mouth daily.     . diazepam (VALIUM) 5 MG tablet     . fluticasone (FLONASE) 50 MCG/ACT nasal spray Place 2 sprays into the nose as needed.     . furosemide (LASIX) 20 MG tablet as needed.     Marland Kitchen levothyroxine (SYNTHROID, LEVOTHROID) 88 MCG tablet Take 88 mcg by mouth daily.      . meclizine (ANTIVERT) 25 MG tablet Take 25 mg by mouth 2 (two) times daily as needed.     . Multiple Vitamin (MULTIVITAMIN) tablet Take 1 tablet by mouth daily.    . polyethylene glycol (MIRALAX /  GLYCOLAX) packet Take 17 g by mouth as needed.    . Vitamin D, Ergocalciferol, (DRISDOL) 50000 units CAPS capsule Take 50,000 Units by mouth. Once a month     No current facility-administered medications on file prior to visit.     Past Medical History:  Diagnosis Date  . Allergy    Rhinitis  . Arthritis    Osteo-athritis  . Breast cancer ([place])   . Cancer ([place])    Right breast  . Cancer ([place])    BCC -  skin  Right Cal  . DVT (deep venous thrombosis) ([facility])    Thrombophlebitis Left  . Family history of breast cancer   . Family history of colon cancer   . Family history of ovarian cancer   . Leg pain   . Peripheral vascular disease ([place])   . Personal history of radiation therapy    right breast 09/2005  . Phlebitis   . Thyroid disease    Hypo-  . Vertigo     Past Surgical History:  Procedure Laterality Date  . BREAST LUMPECTOMY  2007   Right lumpectomy with radiation  . CYSTOSCOPY     For lesion of the urethra  . DEBRIDEMENT TENNIS ELBOW     Right elbow   .  ENDOVENOUS ABLATION SAPHENOUS VEIN W/ LASER  07-29-2011   left greater saphenous vein  . excision of basal cell carcinoma  09-23-2011   right calf  . FOOT SURGERY    . HEMORRHOID SURGERY    . Thyroid ablation  1980  . TOE ARTHROPLASTY Right    5th Toe -  Hammer Toe  . TONSILLECTOMY     and Adenoids     Family History  Problem Relation Age of Onset  . Cancer Sister        breast cancer, vocal cord and parthyroid cancer  . Breast cancer Sister   . Pulmonary fibrosis Mother   . Ovarian cancer Maternal Aunt 60  . Colon cancer Maternal Uncle   . Cancer Paternal Aunt        NOS  . Colon cancer Paternal Uncle        dx in his 61s  . Breast cancer Maternal Aunt 80  . Cancer Maternal Uncle        NOS  . Breast cancer Cousin 75       maternal first cousin  . Cancer Cousin        cancer in her arm  . Rheumatologic disease Neg Hx     Social History   Social History  . Marital status: Married     Spouse name: N/A  . Number of children: 3  . Years of education: N/A   Occupational History  . homemaker    Social History Main Topics  . Smoking status: Passive Smoke Exposure - Never Smoker  . Smokeless tobacco: Never Used     Comment: Father & Husband smoked  . Alcohol use No     Comment: Occasional glass of wine  . Drug use: No  . Sexual activity: Not Currently    Birth control/ protection: Post-menopausal   Other Topics Concern  . None   Social History Narrative   Originally from [place]. Previously did live in [place]. 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 No pets currently. No bird exposure. No mold or hot tub exposure. Enjoys reading. Previously enjoyed golfing but stopped with "leg problems". She does enjoy volunteering.       Objective:   Physical Exam BP 140/80 (BP Location: Left Arm, Patient Position: Sitting, Cuff Size: Normal)   Pulse 95   Ht _0  (1.702 m)   Wt 136 lb (61.7 kg)   SpO2 98%   BMI 21.30 kg/m   General:  Awake. Alert. No acute distress. Elderly female. Integument:  Warm & dry. No rash on exposed skin. Bruise on lateral left forearm. Extremities:  No cyanosis or clubbing.  HEENT:  Moist mucus membranes. No scleral injection or icterus. No oral ulcers. Cardiovascular:  Regular rate. Lower extremity edema present. Unable to appreciate JVD with body positioning.  Pulmonary:  Minimal upper lobe squeaks. Otherwise good aeration bilaterally. Normal work of breathing on room air. Abdomen: Soft. Normal bowel sounds. Nondistended. Grossly nontender. Musculoskeletal:  Kyphosis and scoliosis again noted. No joint effusion appreciated.  PFT 10/29/16: FVC 1.64 L (55%) FEV1 1.22 L (55%) FEV1/FVC 0.74 FEF 25-75 0.83 L (85%)  DLCO corrected 46% 05/20/16: FVC 1.84 L (62%) FEV1 1.33 L (60%)  FEV1/FVC 0.72 FEF 25-75 0.93 L (61%)                                                                                                                          DLCO corrected 55% (Hgb 12.2) 02/11/16: FVC 1.76 L (59%) FEV1 1.27 L (57%) FEV1/FVC 0.72 FEF 25-75 0.84 L (54%) negative bronchodilator response TLC 4.51 L (81%) RV 101% ERV 172% DLCO corrected 49% (Hgb 11.3)  6MWT 10/29/16:  Walked 201 meters / Baseline Sat 99% on RA / Nadir Sat 96% on RA @ end of test 05/20/16:  Walked 304 meters / Baseline Sat 97% on RA / Nadir Sat 97% on RA @ end of test 02/18/16:  Walked 324 meters / Baseline Sat 98% on RA / Nadir Sat 97% on RA @ end of test  IMAGING HRCT CHEST W/O 01/06/16 (previously reviewed by me): Mid and upper lung zone predominant subpleural reticulation with mild traction bronchiectasis. No honeycombing changes. Subcentimeter nodules within right lower lobe with some surrounding ground glass. There is a continuation. No pleural effusion or thickening. No pericardial effusion. No pathologic mediastinal adenopathy.  CXR PA/LAT 11/15/15 (per radiologist):  Normal heart size w/ biapical scarring. Mildly diffuse increased interstitial markings. Old left healed rib fractures. No mass or effusion. Persistent right basilar density. Thoracic compression fractures.   CT CHEST W/O 04/27/12 (previously reviewed by me): No pleural effusion. Apical predominate subpleural reticulation and opacification consistent with fibrotic change. No other parenchymal opacity or nodule appreciated. Borderline precarinal lymphadenopathy. No pericardial effusion.  LABS 12/31/15 ESR: 11 CRP: 0.1 Hypersensitivity pneumonitis panel: Negative ANA: Negative Anti-Jo 1:  <0.2 Centromere antibody screen:  <0.2 Anti-CCP:  <16 Rheumatoid factor:  <10 Double-stranded DNA antibody: 1 Smith antibody:  <0.2 Chromagen antibody:  <0.2 SCL 70:  <0.2 RNP antibody:  <0.2 SSA:  <0.2 SSB:  <0.2  11/19/15 CBC:  5.9/13.6/40.9/207 BMP:   140/4.1/105/25/18/0.81/95/9.5 LFT:  3.8/6.8/0.7/59/10/15 BNP:  77.9    Assessment & Plan:  83 y.o. female with ILD and GERD. I reviewed the patient's 6 minute walk test with her today as well as her urinary function testing. Her walk test distance has decreased significantly likely owing to the fact that she is using a cane today which she was not using previously. The cane is a necessity out of pain originating from her back and affecting her legs. I believe this is also contributing to the significant reduction in her spirometry. Despite this decreased walk test distance she has no significant desaturation. Symptomatically she does not seem to be worsening. Given the pain she is experiencing these are significant found herself or both her spirometry and walk testing. As such, I'm holding off on further testing until the patient comes back and is able to recover from her upcoming procedure. I instructed the patient contact my office if she had any new breathing problems or questions before her next appointment.  1. ILD:  Continuing to hold on immunosuppression. Repeat spirometry with DLCO and 6 minute walk test on room air at next appointment. 2. GERD: Patient encouraged to continue using Zantac 75 mg by mouth daily at bedtime. 3. Health maintenance: Status post Influenza vaccine December 2017, Pneumovax 22 June 2013 & Prevnar September 2017. 4. Follow-up: Return to clinic in 6 months or sooner if needed.  Sonia Baller Ashok Cordia, M.D. [facility] Pager:  [phone] After 3pm or if no response, call [phone] 4:40 PM 10/29/16

## 2016-11-01 DIAGNOSIS — R531 Weakness: Secondary | ICD-10-CM | POA: Diagnosis not present

## 2016-11-01 DIAGNOSIS — M47896 Other spondylosis, lumbar region: Secondary | ICD-10-CM | POA: Diagnosis not present

## 2016-11-01 DIAGNOSIS — R262 Difficulty in walking, not elsewhere classified: Secondary | ICD-10-CM | POA: Diagnosis not present

## 2016-11-03 ENCOUNTER — Ambulatory Visit (INDEPENDENT_AMBULATORY_CARE_PROVIDER_SITE_OTHER): Payer: Medicare Other | Admitting: Sports Medicine

## 2016-11-03 ENCOUNTER — Encounter: Payer: Self-pay | Admitting: Sports Medicine

## 2016-11-03 VITALS — BP 154/83 | HR 87

## 2016-11-03 DIAGNOSIS — M79672 Pain in left foot: Secondary | ICD-10-CM | POA: Diagnosis not present

## 2016-11-03 DIAGNOSIS — R531 Weakness: Secondary | ICD-10-CM | POA: Diagnosis not present

## 2016-11-03 DIAGNOSIS — I739 Peripheral vascular disease, unspecified: Secondary | ICD-10-CM

## 2016-11-03 DIAGNOSIS — G5793 Unspecified mononeuropathy of bilateral lower limbs: Secondary | ICD-10-CM

## 2016-11-03 DIAGNOSIS — B351 Tinea unguium: Secondary | ICD-10-CM | POA: Diagnosis not present

## 2016-11-03 DIAGNOSIS — M47896 Other spondylosis, lumbar region: Secondary | ICD-10-CM | POA: Diagnosis not present

## 2016-11-03 DIAGNOSIS — R262 Difficulty in walking, not elsewhere classified: Secondary | ICD-10-CM | POA: Diagnosis not present

## 2016-11-03 DIAGNOSIS — M79671 Pain in right foot: Secondary | ICD-10-CM

## 2016-11-03 NOTE — Progress Notes (Signed)
Subjective: Pamela [name] is a 83 y.o. female patient seen today in office with complaint of painful thickened and elongated toenails; unable to trim. Patient denies any changes with medical history and medications since last visit, Reports no issues with her ingrown toenails at this time. Reports that she will undergo a procedure for her lower back next week. Patient has no other pedal complaints at this time.   Patient Active Problem List   Diagnosis Date Noted  . Right hip pain 05/18/2016  . ILD (interstitial lung disease) ([place]) 12/31/2015  . Wheeze 12/31/2015  . GERD (gastroesophageal reflux disease) 12/31/2015  . Arthritis 12/31/2015  . Hypothyroidism 12/31/2015  . Family history of breast cancer   . Family history of ovarian cancer   . Family history of colon cancer   . Genetic testing 03/18/2015  . Primary osteoarthritis of right knee 12/16/2014  . Gonalgia 11/28/2014  . Arthralgia of both knees 11/28/2014  . Unspecified erythematous condition 04/20/2012  . Swelling of limb 04/20/2012  . Pain in limb 04/20/2012  . History of breast cancer 01/19/2012  . Varicose veins of lower extremities with other complications 40/34/7425  . Leg swelling 02/25/2011  . Hyperthyroidism 05/31/1978    Current Outpatient Prescriptions on File Prior to Visit  Medication Sig Dispense Refill  . acetaminophen (TYLENOL) 325 MG tablet Take 325 mg by mouth every 6 (six) hours as needed for pain.    Marland Kitchen albuterol (PROVENTIL HFA;VENTOLIN HFA) 108 (90 BASE) MCG/ACT inhaler Inhale 2 puffs into the lungs every 4 (four) hours as needed for wheezing.    Marland Kitchen alendronate (FOSAMAX) 35 MG tablet Take 35 mg by mouth every 7 (seven) days. Take with a full glass of water on an empty stomach.    . celecoxib (CELEBREX) 200 MG capsule Take 200 mg by mouth daily.     . diazepam (VALIUM) 5 MG tablet     . fluticasone (FLONASE) 50 MCG/ACT nasal spray Place 2 sprays into the nose as needed.     . furosemide (LASIX) 20 MG  tablet as needed.     . hydrochlorothiazide (HYDRODIURIL) 25 MG tablet Take 12.5 mg by mouth daily.  2  . levothyroxine (SYNTHROID, LEVOTHROID) 88 MCG tablet Take 88 mcg by mouth daily.      . meclizine (ANTIVERT) 25 MG tablet Take 25 mg by mouth 2 (two) times daily as needed.     . Multiple Vitamin (MULTIVITAMIN) tablet Take 1 tablet by mouth daily.    . polyethylene glycol (MIRALAX / GLYCOLAX) packet Take 17 g by mouth as needed.    . Vitamin D, Ergocalciferol, (DRISDOL) 50000 units CAPS capsule Take 50,000 Units by mouth. Once a month     No current facility-administered medications on file prior to visit.     Allergies  Allergen Reactions  . Cephalosporins     Cefdinir  ( per Dr. Gara Kroner. [place] )  . Demerol   . Ibuprofen Swelling  . Meperidine Nausea Only  . Seasonal Ic [Cholestatin]     Objective: Physical Exam  General: Well developed, nourished, no acute distress, awake, alert and oriented x 3  Vascular: Dorsalis pedis artery 1/4 bilateral, Posterior tibial artery 1/4 bilateral, skin temperature warm to cool proximal to distal bilateral lower extremities, significant varicosities,no  pedal hair present bilateral.  Neurological: Gross sensation present via light touch bilateral. Protective absent bilateral.  Dermatological: Skin is warm, dry, and supple bilateral, Nails 1-10 are tender, long, thick, and discolored with mild subungal debris,  no webspace macerations present bilateral, no open lesions present bilateral, no callus/corns/hyperkeratotic tissue present bilateral. No signs of infection bilateral.  Musculoskeletal: Asymptomatic hallux exostosis boney deformities noted bilateral. Muscular strength within normal limits without painon range of motion. No pain with calf compression bilateral.  Assessment and Plan:  Problem List Items Addressed This Visit    None    Visit Diagnoses    Onychomycosis    -  Primary   PVD (peripheral vascular disease) ([place])        Neuropathy of both feet       Foot pain, bilateral          -Examined patient.  -Discussed treatment options for painful mycotic nails. -Mechanically debrided and reduced mycotic nails with sterile nail nipper and dremel nail file without incident. -Recommend continue topical pain cream as needed  -Recommend good supportive shoes daily for foot type -Patient to return in 2.5 months for follow up evaluation or sooner if symptoms worsen.  Landis Martins, DPM

## 2016-11-08 DIAGNOSIS — M47896 Other spondylosis, lumbar region: Secondary | ICD-10-CM | POA: Diagnosis not present

## 2016-11-08 DIAGNOSIS — R262 Difficulty in walking, not elsewhere classified: Secondary | ICD-10-CM | POA: Diagnosis not present

## 2016-11-08 DIAGNOSIS — R531 Weakness: Secondary | ICD-10-CM | POA: Diagnosis not present

## 2016-11-09 DIAGNOSIS — M47816 Spondylosis without myelopathy or radiculopathy, lumbar region: Secondary | ICD-10-CM | POA: Diagnosis not present

## 2016-11-10 DIAGNOSIS — M47896 Other spondylosis, lumbar region: Secondary | ICD-10-CM | POA: Diagnosis not present

## 2016-11-10 DIAGNOSIS — R262 Difficulty in walking, not elsewhere classified: Secondary | ICD-10-CM | POA: Diagnosis not present

## 2016-11-10 DIAGNOSIS — R531 Weakness: Secondary | ICD-10-CM | POA: Diagnosis not present

## 2016-11-15 DIAGNOSIS — M79606 Pain in leg, unspecified: Secondary | ICD-10-CM | POA: Diagnosis not present

## 2016-11-15 DIAGNOSIS — R079 Chest pain, unspecified: Secondary | ICD-10-CM | POA: Diagnosis not present

## 2016-11-15 DIAGNOSIS — R0602 Shortness of breath: Secondary | ICD-10-CM | POA: Diagnosis not present

## 2016-11-15 DIAGNOSIS — R0789 Other chest pain: Secondary | ICD-10-CM | POA: Diagnosis not present

## 2016-11-15 DIAGNOSIS — J8410 Pulmonary fibrosis, unspecified: Secondary | ICD-10-CM | POA: Diagnosis not present

## 2016-11-15 DIAGNOSIS — R062 Wheezing: Secondary | ICD-10-CM | POA: Diagnosis not present

## 2016-11-15 DIAGNOSIS — R05 Cough: Secondary | ICD-10-CM | POA: Diagnosis not present

## 2016-11-15 DIAGNOSIS — J209 Acute bronchitis, unspecified: Secondary | ICD-10-CM | POA: Diagnosis not present

## 2016-11-19 DIAGNOSIS — J329 Chronic sinusitis, unspecified: Secondary | ICD-10-CM | POA: Diagnosis not present

## 2016-11-19 DIAGNOSIS — R64 Cachexia: Secondary | ICD-10-CM | POA: Diagnosis not present

## 2016-11-19 DIAGNOSIS — J40 Bronchitis, not specified as acute or chronic: Secondary | ICD-10-CM | POA: Diagnosis not present

## 2016-11-19 DIAGNOSIS — E538 Deficiency of other specified B group vitamins: Secondary | ICD-10-CM | POA: Diagnosis not present

## 2016-11-20 DIAGNOSIS — R0603 Acute respiratory distress: Secondary | ICD-10-CM | POA: Diagnosis not present

## 2016-11-20 DIAGNOSIS — R404 Transient alteration of awareness: Secondary | ICD-10-CM | POA: Diagnosis not present

## 2016-11-20 DIAGNOSIS — R0902 Hypoxemia: Secondary | ICD-10-CM | POA: Diagnosis not present

## 2016-11-20 DIAGNOSIS — J8410 Pulmonary fibrosis, unspecified: Secondary | ICD-10-CM | POA: Diagnosis not present

## 2016-11-20 DIAGNOSIS — R509 Fever, unspecified: Secondary | ICD-10-CM | POA: Diagnosis not present

## 2016-11-20 DIAGNOSIS — R531 Weakness: Secondary | ICD-10-CM | POA: Diagnosis not present

## 2016-11-21 DIAGNOSIS — Z79899 Other long term (current) drug therapy: Secondary | ICD-10-CM | POA: Diagnosis not present

## 2016-11-21 DIAGNOSIS — R0902 Hypoxemia: Secondary | ICD-10-CM | POA: Diagnosis not present

## 2016-11-21 DIAGNOSIS — Z885 Allergy status to narcotic agent status: Secondary | ICD-10-CM | POA: Diagnosis not present

## 2016-11-21 DIAGNOSIS — R509 Fever, unspecified: Secondary | ICD-10-CM | POA: Diagnosis not present

## 2016-11-21 DIAGNOSIS — G8929 Other chronic pain: Secondary | ICD-10-CM | POA: Diagnosis present

## 2016-11-21 DIAGNOSIS — J8410 Pulmonary fibrosis, unspecified: Secondary | ICD-10-CM | POA: Diagnosis not present

## 2016-11-21 DIAGNOSIS — Z888 Allergy status to other drugs, medicaments and biological substances status: Secondary | ICD-10-CM | POA: Diagnosis not present

## 2016-11-21 DIAGNOSIS — I10 Essential (primary) hypertension: Secondary | ICD-10-CM | POA: Diagnosis not present

## 2016-11-21 DIAGNOSIS — Z881 Allergy status to other antibiotic agents status: Secondary | ICD-10-CM | POA: Diagnosis not present

## 2016-11-21 DIAGNOSIS — M1990 Unspecified osteoarthritis, unspecified site: Secondary | ICD-10-CM | POA: Diagnosis present

## 2016-11-21 DIAGNOSIS — E039 Hypothyroidism, unspecified: Secondary | ICD-10-CM | POA: Diagnosis not present

## 2016-11-21 DIAGNOSIS — R0603 Acute respiratory distress: Secondary | ICD-10-CM | POA: Diagnosis not present

## 2016-11-21 DIAGNOSIS — M549 Dorsalgia, unspecified: Secondary | ICD-10-CM | POA: Diagnosis not present

## 2016-11-24 DIAGNOSIS — Z9181 History of falling: Secondary | ICD-10-CM | POA: Diagnosis not present

## 2016-11-24 DIAGNOSIS — M1991 Primary osteoarthritis, unspecified site: Secondary | ICD-10-CM | POA: Diagnosis not present

## 2016-11-24 DIAGNOSIS — G8929 Other chronic pain: Secondary | ICD-10-CM | POA: Diagnosis not present

## 2016-11-24 DIAGNOSIS — I10 Essential (primary) hypertension: Secondary | ICD-10-CM | POA: Diagnosis not present

## 2016-11-24 DIAGNOSIS — N059 Unspecified nephritic syndrome with unspecified morphologic changes: Secondary | ICD-10-CM | POA: Diagnosis not present

## 2016-11-24 DIAGNOSIS — Z791 Long term (current) use of non-steroidal anti-inflammatories (NSAID): Secondary | ICD-10-CM | POA: Diagnosis not present

## 2016-11-24 DIAGNOSIS — Z7951 Long term (current) use of inhaled steroids: Secondary | ICD-10-CM | POA: Diagnosis not present

## 2016-11-24 DIAGNOSIS — J8410 Pulmonary fibrosis, unspecified: Secondary | ICD-10-CM | POA: Diagnosis not present

## 2016-11-24 DIAGNOSIS — M549 Dorsalgia, unspecified: Secondary | ICD-10-CM | POA: Diagnosis not present

## 2016-11-25 DIAGNOSIS — M1991 Primary osteoarthritis, unspecified site: Secondary | ICD-10-CM | POA: Diagnosis not present

## 2016-11-25 DIAGNOSIS — I10 Essential (primary) hypertension: Secondary | ICD-10-CM | POA: Diagnosis not present

## 2016-11-25 DIAGNOSIS — J8410 Pulmonary fibrosis, unspecified: Secondary | ICD-10-CM | POA: Diagnosis not present

## 2016-11-25 DIAGNOSIS — N059 Unspecified nephritic syndrome with unspecified morphologic changes: Secondary | ICD-10-CM | POA: Diagnosis not present

## 2016-11-25 DIAGNOSIS — G8929 Other chronic pain: Secondary | ICD-10-CM | POA: Diagnosis not present

## 2016-11-25 DIAGNOSIS — M549 Dorsalgia, unspecified: Secondary | ICD-10-CM | POA: Diagnosis not present

## 2016-11-26 DIAGNOSIS — I10 Essential (primary) hypertension: Secondary | ICD-10-CM | POA: Diagnosis not present

## 2016-11-26 DIAGNOSIS — M549 Dorsalgia, unspecified: Secondary | ICD-10-CM | POA: Diagnosis not present

## 2016-11-26 DIAGNOSIS — G8929 Other chronic pain: Secondary | ICD-10-CM | POA: Diagnosis not present

## 2016-11-26 DIAGNOSIS — J8410 Pulmonary fibrosis, unspecified: Secondary | ICD-10-CM | POA: Diagnosis not present

## 2016-11-26 DIAGNOSIS — M1991 Primary osteoarthritis, unspecified site: Secondary | ICD-10-CM | POA: Diagnosis not present

## 2016-11-26 DIAGNOSIS — N059 Unspecified nephritic syndrome with unspecified morphologic changes: Secondary | ICD-10-CM | POA: Diagnosis not present

## 2016-11-29 DIAGNOSIS — M1991 Primary osteoarthritis, unspecified site: Secondary | ICD-10-CM | POA: Diagnosis not present

## 2016-11-29 DIAGNOSIS — M549 Dorsalgia, unspecified: Secondary | ICD-10-CM | POA: Diagnosis not present

## 2016-11-29 DIAGNOSIS — G8929 Other chronic pain: Secondary | ICD-10-CM | POA: Diagnosis not present

## 2016-11-29 DIAGNOSIS — J8410 Pulmonary fibrosis, unspecified: Secondary | ICD-10-CM | POA: Diagnosis not present

## 2016-11-29 DIAGNOSIS — Z6820 Body mass index (BMI) 20.0-20.9, adult: Secondary | ICD-10-CM | POA: Diagnosis not present

## 2016-11-29 DIAGNOSIS — I10 Essential (primary) hypertension: Secondary | ICD-10-CM | POA: Diagnosis not present

## 2016-11-29 DIAGNOSIS — N059 Unspecified nephritic syndrome with unspecified morphologic changes: Secondary | ICD-10-CM | POA: Diagnosis not present

## 2016-11-30 DIAGNOSIS — G8929 Other chronic pain: Secondary | ICD-10-CM | POA: Diagnosis not present

## 2016-11-30 DIAGNOSIS — N059 Unspecified nephritic syndrome with unspecified morphologic changes: Secondary | ICD-10-CM | POA: Diagnosis not present

## 2016-11-30 DIAGNOSIS — I10 Essential (primary) hypertension: Secondary | ICD-10-CM | POA: Diagnosis not present

## 2016-11-30 DIAGNOSIS — J8410 Pulmonary fibrosis, unspecified: Secondary | ICD-10-CM | POA: Diagnosis not present

## 2016-11-30 DIAGNOSIS — M1991 Primary osteoarthritis, unspecified site: Secondary | ICD-10-CM | POA: Diagnosis not present

## 2016-11-30 DIAGNOSIS — M549 Dorsalgia, unspecified: Secondary | ICD-10-CM | POA: Diagnosis not present

## 2016-12-02 DIAGNOSIS — N059 Unspecified nephritic syndrome with unspecified morphologic changes: Secondary | ICD-10-CM | POA: Diagnosis not present

## 2016-12-02 DIAGNOSIS — J8410 Pulmonary fibrosis, unspecified: Secondary | ICD-10-CM | POA: Diagnosis not present

## 2016-12-02 DIAGNOSIS — G8929 Other chronic pain: Secondary | ICD-10-CM | POA: Diagnosis not present

## 2016-12-02 DIAGNOSIS — M1991 Primary osteoarthritis, unspecified site: Secondary | ICD-10-CM | POA: Diagnosis not present

## 2016-12-02 DIAGNOSIS — I10 Essential (primary) hypertension: Secondary | ICD-10-CM | POA: Diagnosis not present

## 2016-12-02 DIAGNOSIS — M549 Dorsalgia, unspecified: Secondary | ICD-10-CM | POA: Diagnosis not present

## 2016-12-03 DIAGNOSIS — N059 Unspecified nephritic syndrome with unspecified morphologic changes: Secondary | ICD-10-CM | POA: Diagnosis not present

## 2016-12-03 DIAGNOSIS — I10 Essential (primary) hypertension: Secondary | ICD-10-CM | POA: Diagnosis not present

## 2016-12-03 DIAGNOSIS — M549 Dorsalgia, unspecified: Secondary | ICD-10-CM | POA: Diagnosis not present

## 2016-12-03 DIAGNOSIS — G8929 Other chronic pain: Secondary | ICD-10-CM | POA: Diagnosis not present

## 2016-12-03 DIAGNOSIS — J8410 Pulmonary fibrosis, unspecified: Secondary | ICD-10-CM | POA: Diagnosis not present

## 2016-12-03 DIAGNOSIS — M1991 Primary osteoarthritis, unspecified site: Secondary | ICD-10-CM | POA: Diagnosis not present

## 2016-12-06 DIAGNOSIS — I10 Essential (primary) hypertension: Secondary | ICD-10-CM | POA: Diagnosis not present

## 2016-12-06 DIAGNOSIS — J8410 Pulmonary fibrosis, unspecified: Secondary | ICD-10-CM | POA: Diagnosis not present

## 2016-12-06 DIAGNOSIS — M1991 Primary osteoarthritis, unspecified site: Secondary | ICD-10-CM | POA: Diagnosis not present

## 2016-12-06 DIAGNOSIS — G8929 Other chronic pain: Secondary | ICD-10-CM | POA: Diagnosis not present

## 2016-12-06 DIAGNOSIS — M549 Dorsalgia, unspecified: Secondary | ICD-10-CM | POA: Diagnosis not present

## 2016-12-06 DIAGNOSIS — N059 Unspecified nephritic syndrome with unspecified morphologic changes: Secondary | ICD-10-CM | POA: Diagnosis not present

## 2016-12-07 DIAGNOSIS — G8929 Other chronic pain: Secondary | ICD-10-CM | POA: Diagnosis not present

## 2016-12-07 DIAGNOSIS — N059 Unspecified nephritic syndrome with unspecified morphologic changes: Secondary | ICD-10-CM | POA: Diagnosis not present

## 2016-12-07 DIAGNOSIS — M549 Dorsalgia, unspecified: Secondary | ICD-10-CM | POA: Diagnosis not present

## 2016-12-07 DIAGNOSIS — M1991 Primary osteoarthritis, unspecified site: Secondary | ICD-10-CM | POA: Diagnosis not present

## 2016-12-07 DIAGNOSIS — I10 Essential (primary) hypertension: Secondary | ICD-10-CM | POA: Diagnosis not present

## 2016-12-07 DIAGNOSIS — J8410 Pulmonary fibrosis, unspecified: Secondary | ICD-10-CM | POA: Diagnosis not present

## 2016-12-09 DIAGNOSIS — G8929 Other chronic pain: Secondary | ICD-10-CM | POA: Diagnosis not present

## 2016-12-09 DIAGNOSIS — M1991 Primary osteoarthritis, unspecified site: Secondary | ICD-10-CM | POA: Diagnosis not present

## 2016-12-09 DIAGNOSIS — M549 Dorsalgia, unspecified: Secondary | ICD-10-CM | POA: Diagnosis not present

## 2016-12-09 DIAGNOSIS — J8410 Pulmonary fibrosis, unspecified: Secondary | ICD-10-CM | POA: Diagnosis not present

## 2016-12-09 DIAGNOSIS — N059 Unspecified nephritic syndrome with unspecified morphologic changes: Secondary | ICD-10-CM | POA: Diagnosis not present

## 2016-12-09 DIAGNOSIS — I10 Essential (primary) hypertension: Secondary | ICD-10-CM | POA: Diagnosis not present

## 2016-12-13 DIAGNOSIS — J8410 Pulmonary fibrosis, unspecified: Secondary | ICD-10-CM | POA: Diagnosis not present

## 2016-12-14 DIAGNOSIS — M549 Dorsalgia, unspecified: Secondary | ICD-10-CM | POA: Diagnosis not present

## 2016-12-14 DIAGNOSIS — N059 Unspecified nephritic syndrome with unspecified morphologic changes: Secondary | ICD-10-CM | POA: Diagnosis not present

## 2016-12-14 DIAGNOSIS — I10 Essential (primary) hypertension: Secondary | ICD-10-CM | POA: Diagnosis not present

## 2016-12-14 DIAGNOSIS — G8929 Other chronic pain: Secondary | ICD-10-CM | POA: Diagnosis not present

## 2016-12-14 DIAGNOSIS — J8410 Pulmonary fibrosis, unspecified: Secondary | ICD-10-CM | POA: Diagnosis not present

## 2016-12-14 DIAGNOSIS — M1991 Primary osteoarthritis, unspecified site: Secondary | ICD-10-CM | POA: Diagnosis not present

## 2016-12-16 DIAGNOSIS — I10 Essential (primary) hypertension: Secondary | ICD-10-CM | POA: Diagnosis not present

## 2016-12-16 DIAGNOSIS — M170 Bilateral primary osteoarthritis of knee: Secondary | ICD-10-CM | POA: Diagnosis not present

## 2016-12-16 DIAGNOSIS — M1991 Primary osteoarthritis, unspecified site: Secondary | ICD-10-CM | POA: Diagnosis not present

## 2016-12-16 DIAGNOSIS — G8929 Other chronic pain: Secondary | ICD-10-CM | POA: Diagnosis not present

## 2016-12-16 DIAGNOSIS — N059 Unspecified nephritic syndrome with unspecified morphologic changes: Secondary | ICD-10-CM | POA: Diagnosis not present

## 2016-12-16 DIAGNOSIS — J8410 Pulmonary fibrosis, unspecified: Secondary | ICD-10-CM | POA: Diagnosis not present

## 2016-12-16 DIAGNOSIS — M549 Dorsalgia, unspecified: Secondary | ICD-10-CM | POA: Diagnosis not present

## 2016-12-17 DIAGNOSIS — M170 Bilateral primary osteoarthritis of knee: Secondary | ICD-10-CM | POA: Diagnosis not present

## 2016-12-19 DIAGNOSIS — N059 Unspecified nephritic syndrome with unspecified morphologic changes: Secondary | ICD-10-CM | POA: Diagnosis not present

## 2016-12-19 DIAGNOSIS — I10 Essential (primary) hypertension: Secondary | ICD-10-CM | POA: Diagnosis not present

## 2016-12-19 DIAGNOSIS — M1991 Primary osteoarthritis, unspecified site: Secondary | ICD-10-CM | POA: Diagnosis not present

## 2016-12-19 DIAGNOSIS — M549 Dorsalgia, unspecified: Secondary | ICD-10-CM | POA: Diagnosis not present

## 2016-12-19 DIAGNOSIS — G8929 Other chronic pain: Secondary | ICD-10-CM | POA: Diagnosis not present

## 2016-12-19 DIAGNOSIS — J8410 Pulmonary fibrosis, unspecified: Secondary | ICD-10-CM | POA: Diagnosis not present

## 2016-12-21 DIAGNOSIS — G8929 Other chronic pain: Secondary | ICD-10-CM | POA: Diagnosis not present

## 2016-12-21 DIAGNOSIS — M1991 Primary osteoarthritis, unspecified site: Secondary | ICD-10-CM | POA: Diagnosis not present

## 2016-12-21 DIAGNOSIS — I10 Essential (primary) hypertension: Secondary | ICD-10-CM | POA: Diagnosis not present

## 2016-12-21 DIAGNOSIS — M549 Dorsalgia, unspecified: Secondary | ICD-10-CM | POA: Diagnosis not present

## 2016-12-21 DIAGNOSIS — N059 Unspecified nephritic syndrome with unspecified morphologic changes: Secondary | ICD-10-CM | POA: Diagnosis not present

## 2016-12-21 DIAGNOSIS — J8410 Pulmonary fibrosis, unspecified: Secondary | ICD-10-CM | POA: Diagnosis not present

## 2016-12-22 DIAGNOSIS — I10 Essential (primary) hypertension: Secondary | ICD-10-CM | POA: Diagnosis not present

## 2016-12-22 DIAGNOSIS — J8410 Pulmonary fibrosis, unspecified: Secondary | ICD-10-CM | POA: Diagnosis not present

## 2016-12-22 DIAGNOSIS — N059 Unspecified nephritic syndrome with unspecified morphologic changes: Secondary | ICD-10-CM | POA: Diagnosis not present

## 2016-12-22 DIAGNOSIS — M1991 Primary osteoarthritis, unspecified site: Secondary | ICD-10-CM | POA: Diagnosis not present

## 2016-12-22 DIAGNOSIS — M549 Dorsalgia, unspecified: Secondary | ICD-10-CM | POA: Diagnosis not present

## 2016-12-22 DIAGNOSIS — G8929 Other chronic pain: Secondary | ICD-10-CM | POA: Diagnosis not present

## 2016-12-23 DIAGNOSIS — G8929 Other chronic pain: Secondary | ICD-10-CM | POA: Diagnosis not present

## 2016-12-23 DIAGNOSIS — I10 Essential (primary) hypertension: Secondary | ICD-10-CM | POA: Diagnosis not present

## 2016-12-23 DIAGNOSIS — M549 Dorsalgia, unspecified: Secondary | ICD-10-CM | POA: Diagnosis not present

## 2016-12-23 DIAGNOSIS — N059 Unspecified nephritic syndrome with unspecified morphologic changes: Secondary | ICD-10-CM | POA: Diagnosis not present

## 2016-12-23 DIAGNOSIS — J8410 Pulmonary fibrosis, unspecified: Secondary | ICD-10-CM | POA: Diagnosis not present

## 2016-12-23 DIAGNOSIS — M1991 Primary osteoarthritis, unspecified site: Secondary | ICD-10-CM | POA: Diagnosis not present

## 2016-12-24 DIAGNOSIS — N059 Unspecified nephritic syndrome with unspecified morphologic changes: Secondary | ICD-10-CM | POA: Diagnosis not present

## 2016-12-24 DIAGNOSIS — M549 Dorsalgia, unspecified: Secondary | ICD-10-CM | POA: Diagnosis not present

## 2016-12-24 DIAGNOSIS — J8410 Pulmonary fibrosis, unspecified: Secondary | ICD-10-CM | POA: Diagnosis not present

## 2016-12-24 DIAGNOSIS — I10 Essential (primary) hypertension: Secondary | ICD-10-CM | POA: Diagnosis not present

## 2016-12-24 DIAGNOSIS — G8929 Other chronic pain: Secondary | ICD-10-CM | POA: Diagnosis not present

## 2016-12-24 DIAGNOSIS — M1991 Primary osteoarthritis, unspecified site: Secondary | ICD-10-CM | POA: Diagnosis not present

## 2016-12-27 DIAGNOSIS — M549 Dorsalgia, unspecified: Secondary | ICD-10-CM | POA: Diagnosis not present

## 2016-12-27 DIAGNOSIS — J8410 Pulmonary fibrosis, unspecified: Secondary | ICD-10-CM | POA: Diagnosis not present

## 2016-12-27 DIAGNOSIS — M1991 Primary osteoarthritis, unspecified site: Secondary | ICD-10-CM | POA: Diagnosis not present

## 2016-12-27 DIAGNOSIS — I10 Essential (primary) hypertension: Secondary | ICD-10-CM | POA: Diagnosis not present

## 2016-12-27 DIAGNOSIS — N059 Unspecified nephritic syndrome with unspecified morphologic changes: Secondary | ICD-10-CM | POA: Diagnosis not present

## 2016-12-27 DIAGNOSIS — G8929 Other chronic pain: Secondary | ICD-10-CM | POA: Diagnosis not present

## 2016-12-28 DIAGNOSIS — M1991 Primary osteoarthritis, unspecified site: Secondary | ICD-10-CM | POA: Diagnosis not present

## 2016-12-28 DIAGNOSIS — J8410 Pulmonary fibrosis, unspecified: Secondary | ICD-10-CM | POA: Diagnosis not present

## 2016-12-28 DIAGNOSIS — M549 Dorsalgia, unspecified: Secondary | ICD-10-CM | POA: Diagnosis not present

## 2016-12-28 DIAGNOSIS — N059 Unspecified nephritic syndrome with unspecified morphologic changes: Secondary | ICD-10-CM | POA: Diagnosis not present

## 2016-12-28 DIAGNOSIS — G8929 Other chronic pain: Secondary | ICD-10-CM | POA: Diagnosis not present

## 2016-12-28 DIAGNOSIS — I10 Essential (primary) hypertension: Secondary | ICD-10-CM | POA: Diagnosis not present

## 2016-12-29 DIAGNOSIS — M1991 Primary osteoarthritis, unspecified site: Secondary | ICD-10-CM | POA: Diagnosis not present

## 2016-12-29 DIAGNOSIS — M549 Dorsalgia, unspecified: Secondary | ICD-10-CM | POA: Diagnosis not present

## 2016-12-29 DIAGNOSIS — J8410 Pulmonary fibrosis, unspecified: Secondary | ICD-10-CM | POA: Diagnosis not present

## 2016-12-29 DIAGNOSIS — N059 Unspecified nephritic syndrome with unspecified morphologic changes: Secondary | ICD-10-CM | POA: Diagnosis not present

## 2016-12-29 DIAGNOSIS — I10 Essential (primary) hypertension: Secondary | ICD-10-CM | POA: Diagnosis not present

## 2016-12-29 DIAGNOSIS — G8929 Other chronic pain: Secondary | ICD-10-CM | POA: Diagnosis not present

## 2016-12-30 DIAGNOSIS — I10 Essential (primary) hypertension: Secondary | ICD-10-CM | POA: Diagnosis not present

## 2016-12-30 DIAGNOSIS — Z1389 Encounter for screening for other disorder: Secondary | ICD-10-CM | POA: Diagnosis not present

## 2016-12-30 DIAGNOSIS — G8929 Other chronic pain: Secondary | ICD-10-CM | POA: Diagnosis not present

## 2016-12-30 DIAGNOSIS — R609 Edema, unspecified: Secondary | ICD-10-CM | POA: Diagnosis not present

## 2016-12-30 DIAGNOSIS — M1991 Primary osteoarthritis, unspecified site: Secondary | ICD-10-CM | POA: Diagnosis not present

## 2016-12-30 DIAGNOSIS — M549 Dorsalgia, unspecified: Secondary | ICD-10-CM | POA: Diagnosis not present

## 2016-12-30 DIAGNOSIS — N059 Unspecified nephritic syndrome with unspecified morphologic changes: Secondary | ICD-10-CM | POA: Diagnosis not present

## 2016-12-30 DIAGNOSIS — R300 Dysuria: Secondary | ICD-10-CM | POA: Diagnosis not present

## 2016-12-30 DIAGNOSIS — J8410 Pulmonary fibrosis, unspecified: Secondary | ICD-10-CM | POA: Diagnosis not present

## 2017-01-03 DIAGNOSIS — I10 Essential (primary) hypertension: Secondary | ICD-10-CM | POA: Diagnosis not present

## 2017-01-03 DIAGNOSIS — N059 Unspecified nephritic syndrome with unspecified morphologic changes: Secondary | ICD-10-CM | POA: Diagnosis not present

## 2017-01-03 DIAGNOSIS — M549 Dorsalgia, unspecified: Secondary | ICD-10-CM | POA: Diagnosis not present

## 2017-01-03 DIAGNOSIS — G8929 Other chronic pain: Secondary | ICD-10-CM | POA: Diagnosis not present

## 2017-01-03 DIAGNOSIS — J8410 Pulmonary fibrosis, unspecified: Secondary | ICD-10-CM | POA: Diagnosis not present

## 2017-01-03 DIAGNOSIS — M1991 Primary osteoarthritis, unspecified site: Secondary | ICD-10-CM | POA: Diagnosis not present

## 2017-01-04 DIAGNOSIS — G8929 Other chronic pain: Secondary | ICD-10-CM | POA: Diagnosis not present

## 2017-01-04 DIAGNOSIS — M549 Dorsalgia, unspecified: Secondary | ICD-10-CM | POA: Diagnosis not present

## 2017-01-04 DIAGNOSIS — J8410 Pulmonary fibrosis, unspecified: Secondary | ICD-10-CM | POA: Diagnosis not present

## 2017-01-04 DIAGNOSIS — I10 Essential (primary) hypertension: Secondary | ICD-10-CM | POA: Diagnosis not present

## 2017-01-04 DIAGNOSIS — N059 Unspecified nephritic syndrome with unspecified morphologic changes: Secondary | ICD-10-CM | POA: Diagnosis not present

## 2017-01-04 DIAGNOSIS — M1991 Primary osteoarthritis, unspecified site: Secondary | ICD-10-CM | POA: Diagnosis not present

## 2017-01-06 DIAGNOSIS — N059 Unspecified nephritic syndrome with unspecified morphologic changes: Secondary | ICD-10-CM | POA: Diagnosis not present

## 2017-01-06 DIAGNOSIS — G8929 Other chronic pain: Secondary | ICD-10-CM | POA: Diagnosis not present

## 2017-01-06 DIAGNOSIS — I10 Essential (primary) hypertension: Secondary | ICD-10-CM | POA: Diagnosis not present

## 2017-01-06 DIAGNOSIS — J8410 Pulmonary fibrosis, unspecified: Secondary | ICD-10-CM | POA: Diagnosis not present

## 2017-01-06 DIAGNOSIS — M549 Dorsalgia, unspecified: Secondary | ICD-10-CM | POA: Diagnosis not present

## 2017-01-06 DIAGNOSIS — M1991 Primary osteoarthritis, unspecified site: Secondary | ICD-10-CM | POA: Diagnosis not present

## 2017-01-10 DIAGNOSIS — G8929 Other chronic pain: Secondary | ICD-10-CM | POA: Diagnosis not present

## 2017-01-10 DIAGNOSIS — N059 Unspecified nephritic syndrome with unspecified morphologic changes: Secondary | ICD-10-CM | POA: Diagnosis not present

## 2017-01-10 DIAGNOSIS — M549 Dorsalgia, unspecified: Secondary | ICD-10-CM | POA: Diagnosis not present

## 2017-01-10 DIAGNOSIS — M1991 Primary osteoarthritis, unspecified site: Secondary | ICD-10-CM | POA: Diagnosis not present

## 2017-01-10 DIAGNOSIS — J8410 Pulmonary fibrosis, unspecified: Secondary | ICD-10-CM | POA: Diagnosis not present

## 2017-01-10 DIAGNOSIS — I10 Essential (primary) hypertension: Secondary | ICD-10-CM | POA: Diagnosis not present

## 2017-01-11 DIAGNOSIS — I10 Essential (primary) hypertension: Secondary | ICD-10-CM | POA: Diagnosis not present

## 2017-01-11 DIAGNOSIS — M549 Dorsalgia, unspecified: Secondary | ICD-10-CM | POA: Diagnosis not present

## 2017-01-11 DIAGNOSIS — J8410 Pulmonary fibrosis, unspecified: Secondary | ICD-10-CM | POA: Diagnosis not present

## 2017-01-11 DIAGNOSIS — M1991 Primary osteoarthritis, unspecified site: Secondary | ICD-10-CM | POA: Diagnosis not present

## 2017-01-11 DIAGNOSIS — G8929 Other chronic pain: Secondary | ICD-10-CM | POA: Diagnosis not present

## 2017-01-11 DIAGNOSIS — N059 Unspecified nephritic syndrome with unspecified morphologic changes: Secondary | ICD-10-CM | POA: Diagnosis not present

## 2017-01-12 ENCOUNTER — Ambulatory Visit (INDEPENDENT_AMBULATORY_CARE_PROVIDER_SITE_OTHER): Payer: Medicare Other | Admitting: Sports Medicine

## 2017-01-12 DIAGNOSIS — B351 Tinea unguium: Secondary | ICD-10-CM | POA: Diagnosis not present

## 2017-01-12 DIAGNOSIS — M549 Dorsalgia, unspecified: Secondary | ICD-10-CM | POA: Diagnosis not present

## 2017-01-12 DIAGNOSIS — N059 Unspecified nephritic syndrome with unspecified morphologic changes: Secondary | ICD-10-CM | POA: Diagnosis not present

## 2017-01-12 DIAGNOSIS — M1991 Primary osteoarthritis, unspecified site: Secondary | ICD-10-CM | POA: Diagnosis not present

## 2017-01-12 DIAGNOSIS — I739 Peripheral vascular disease, unspecified: Secondary | ICD-10-CM

## 2017-01-12 DIAGNOSIS — M79672 Pain in left foot: Secondary | ICD-10-CM | POA: Diagnosis not present

## 2017-01-12 DIAGNOSIS — G8929 Other chronic pain: Secondary | ICD-10-CM | POA: Diagnosis not present

## 2017-01-12 DIAGNOSIS — M79671 Pain in right foot: Secondary | ICD-10-CM | POA: Diagnosis not present

## 2017-01-12 DIAGNOSIS — J8410 Pulmonary fibrosis, unspecified: Secondary | ICD-10-CM | POA: Diagnosis not present

## 2017-01-12 DIAGNOSIS — G5793 Unspecified mononeuropathy of bilateral lower limbs: Secondary | ICD-10-CM

## 2017-01-12 DIAGNOSIS — I10 Essential (primary) hypertension: Secondary | ICD-10-CM | POA: Diagnosis not present

## 2017-01-12 NOTE — Progress Notes (Signed)
Subjective: STARLENE CONSUEGRA is a 83 y.o. female patient seen today in office with complaint of painful thickened and elongated toenails; unable to trim. Patient states that since last visit was hospitalized for a UTI and after having a allergic reaction to sulfa. Reports that she has home physical therapy and is getting better.  Patient has no other pedal complaints at this time.   Patient Active Problem List   Diagnosis Date Noted  . Right hip pain 05/18/2016  . ILD (interstitial lung disease) ([place]) 12/31/2015  . Wheeze 12/31/2015  . GERD (gastroesophageal reflux disease) 12/31/2015  . Arthritis 12/31/2015  . Hypothyroidism 12/31/2015  . Family history of breast cancer   . Family history of ovarian cancer   . Family history of colon cancer   . Genetic testing 03/18/2015  . Primary osteoarthritis of right knee 12/16/2014  . Gonalgia 11/28/2014  . Arthralgia of both knees 11/28/2014  . Unspecified erythematous condition 04/20/2012  . Swelling of limb 04/20/2012  . Pain in limb 04/20/2012  . History of breast cancer 01/19/2012  . Varicose veins of lower extremities with other complications 28/78/6767  . Leg swelling 02/25/2011  . Hyperthyroidism 05/31/1978    Current Outpatient Prescriptions on File Prior to Visit  Medication Sig Dispense Refill  . acetaminophen (TYLENOL) 325 MG tablet Take 325 mg by mouth every 6 (six) hours as needed for pain.    Marland Kitchen albuterol (PROVENTIL HFA;VENTOLIN HFA) 108 (90 BASE) MCG/ACT inhaler Inhale 2 puffs into the lungs every 4 (four) hours as needed for wheezing.    Marland Kitchen alendronate (FOSAMAX) 35 MG tablet Take 35 mg by mouth every 7 (seven) days. Take with a full glass of water on an empty stomach.    . celecoxib (CELEBREX) 200 MG capsule Take 200 mg by mouth daily.     . diazepam (VALIUM) 5 MG tablet     . fluticasone (FLONASE) 50 MCG/ACT nasal spray Place 2 sprays into the nose as needed.     . furosemide (LASIX) 20 MG tablet as needed.     .  hydrochlorothiazide (HYDRODIURIL) 25 MG tablet Take 12.5 mg by mouth daily.  2  . levothyroxine (SYNTHROID, LEVOTHROID) 88 MCG tablet Take 88 mcg by mouth daily.      . meclizine (ANTIVERT) 25 MG tablet Take 25 mg by mouth 2 (two) times daily as needed.     . Multiple Vitamin (MULTIVITAMIN) tablet Take 1 tablet by mouth daily.    . polyethylene glycol (MIRALAX / GLYCOLAX) packet Take 17 g by mouth as needed.    . Vitamin D, Ergocalciferol, (DRISDOL) 50000 units CAPS capsule Take 50,000 Units by mouth. Once a month     No current facility-administered medications on file prior to visit.     Allergies  Allergen Reactions  . Cephalosporins     Cefdinir  ( per Dr. Gara Kroner. [place] )  . Demerol   . Ibuprofen Swelling  . Meperidine Nausea Only  . Seasonal Ic [Cholestatin]   . Sulfa Antibiotics Rash    Objective: Physical Exam  General: Well developed, nourished, no acute distress, awake, alert and oriented x 3  Vascular: Dorsalis pedis artery 1/4 bilateral, Posterior tibial artery 1/4 bilateral, skin temperature warm to cool proximal to distal bilateral lower extremities, significant varicosities, no  pedal hair present bilateral.  Neurological: Gross sensation present via light touch bilateral. Protective absent bilateral.  Dermatological: Skin is warm, dry, and supple bilateral, Nails 1-10 are tender, long, thick, and discolored with mild  subungal debris, no webspace macerations present bilateral, no open lesions present bilateral,resolved rash bilateral shins, no callus/corns/hyperkeratotic tissue present bilateral. No signs of infection bilateral.  Musculoskeletal: Asymptomatic hallux exostosis boney deformities noted bilateral. Muscular strength within normal limits without painon range of motion. No pain with calf compression bilateral.  Assessment and Plan:  Problem List Items Addressed This Visit    None    Visit Diagnoses    Onychomycosis    -  Primary   PVD (peripheral  vascular disease) ([place])       Neuropathy of both feet       Foot pain, bilateral          -Examined patient.  -Discussed treatment options for painful mycotic nails. -Mechanically debrided and reduced mycotic nails with sterile nail nipper and dremel nail file without incident. -Recommend continue topical pain cream as needed  -Recommend good supportive shoes daily for foot type -Patient to return in 2.5 months for follow up evaluation or sooner if symptoms worsen.  Landis Martins, DPM

## 2017-01-13 DIAGNOSIS — N059 Unspecified nephritic syndrome with unspecified morphologic changes: Secondary | ICD-10-CM | POA: Diagnosis not present

## 2017-01-13 DIAGNOSIS — G8929 Other chronic pain: Secondary | ICD-10-CM | POA: Diagnosis not present

## 2017-01-13 DIAGNOSIS — J8410 Pulmonary fibrosis, unspecified: Secondary | ICD-10-CM | POA: Diagnosis not present

## 2017-01-13 DIAGNOSIS — M1991 Primary osteoarthritis, unspecified site: Secondary | ICD-10-CM | POA: Diagnosis not present

## 2017-01-13 DIAGNOSIS — I10 Essential (primary) hypertension: Secondary | ICD-10-CM | POA: Diagnosis not present

## 2017-01-13 DIAGNOSIS — M549 Dorsalgia, unspecified: Secondary | ICD-10-CM | POA: Diagnosis not present

## 2017-01-17 DIAGNOSIS — J8410 Pulmonary fibrosis, unspecified: Secondary | ICD-10-CM | POA: Diagnosis not present

## 2017-01-17 DIAGNOSIS — M549 Dorsalgia, unspecified: Secondary | ICD-10-CM | POA: Diagnosis not present

## 2017-01-17 DIAGNOSIS — N059 Unspecified nephritic syndrome with unspecified morphologic changes: Secondary | ICD-10-CM | POA: Diagnosis not present

## 2017-01-17 DIAGNOSIS — G8929 Other chronic pain: Secondary | ICD-10-CM | POA: Diagnosis not present

## 2017-01-17 DIAGNOSIS — I10 Essential (primary) hypertension: Secondary | ICD-10-CM | POA: Diagnosis not present

## 2017-01-17 DIAGNOSIS — M1991 Primary osteoarthritis, unspecified site: Secondary | ICD-10-CM | POA: Diagnosis not present

## 2017-01-19 DIAGNOSIS — M1991 Primary osteoarthritis, unspecified site: Secondary | ICD-10-CM | POA: Diagnosis not present

## 2017-01-19 DIAGNOSIS — M549 Dorsalgia, unspecified: Secondary | ICD-10-CM | POA: Diagnosis not present

## 2017-01-19 DIAGNOSIS — J8410 Pulmonary fibrosis, unspecified: Secondary | ICD-10-CM | POA: Diagnosis not present

## 2017-01-19 DIAGNOSIS — G8929 Other chronic pain: Secondary | ICD-10-CM | POA: Diagnosis not present

## 2017-01-19 DIAGNOSIS — I10 Essential (primary) hypertension: Secondary | ICD-10-CM | POA: Diagnosis not present

## 2017-01-19 DIAGNOSIS — N059 Unspecified nephritic syndrome with unspecified morphologic changes: Secondary | ICD-10-CM | POA: Diagnosis not present

## 2017-01-20 DIAGNOSIS — M1991 Primary osteoarthritis, unspecified site: Secondary | ICD-10-CM | POA: Diagnosis not present

## 2017-01-20 DIAGNOSIS — J8410 Pulmonary fibrosis, unspecified: Secondary | ICD-10-CM | POA: Diagnosis not present

## 2017-01-20 DIAGNOSIS — N059 Unspecified nephritic syndrome with unspecified morphologic changes: Secondary | ICD-10-CM | POA: Diagnosis not present

## 2017-01-20 DIAGNOSIS — M549 Dorsalgia, unspecified: Secondary | ICD-10-CM | POA: Diagnosis not present

## 2017-01-20 DIAGNOSIS — G8929 Other chronic pain: Secondary | ICD-10-CM | POA: Diagnosis not present

## 2017-01-20 DIAGNOSIS — I10 Essential (primary) hypertension: Secondary | ICD-10-CM | POA: Diagnosis not present

## 2017-01-26 DIAGNOSIS — R29898 Other symptoms and signs involving the musculoskeletal system: Secondary | ICD-10-CM | POA: Diagnosis not present

## 2017-01-26 DIAGNOSIS — M5440 Lumbago with sciatica, unspecified side: Secondary | ICD-10-CM | POA: Diagnosis not present

## 2017-01-26 DIAGNOSIS — J8410 Pulmonary fibrosis, unspecified: Secondary | ICD-10-CM | POA: Diagnosis not present

## 2017-01-26 DIAGNOSIS — M6281 Muscle weakness (generalized): Secondary | ICD-10-CM | POA: Diagnosis not present

## 2017-02-01 DIAGNOSIS — Z85828 Personal history of other malignant neoplasm of skin: Secondary | ICD-10-CM | POA: Diagnosis not present

## 2017-02-01 DIAGNOSIS — L859 Epidermal thickening, unspecified: Secondary | ICD-10-CM | POA: Diagnosis not present

## 2017-02-01 DIAGNOSIS — L820 Inflamed seborrheic keratosis: Secondary | ICD-10-CM | POA: Diagnosis not present

## 2017-02-01 DIAGNOSIS — D485 Neoplasm of uncertain behavior of skin: Secondary | ICD-10-CM | POA: Diagnosis not present

## 2017-02-02 DIAGNOSIS — R262 Difficulty in walking, not elsewhere classified: Secondary | ICD-10-CM | POA: Diagnosis not present

## 2017-02-02 DIAGNOSIS — M5440 Lumbago with sciatica, unspecified side: Secondary | ICD-10-CM | POA: Diagnosis not present

## 2017-02-02 DIAGNOSIS — R29898 Other symptoms and signs involving the musculoskeletal system: Secondary | ICD-10-CM | POA: Diagnosis not present

## 2017-02-02 DIAGNOSIS — R531 Weakness: Secondary | ICD-10-CM | POA: Diagnosis not present

## 2017-02-02 DIAGNOSIS — J8410 Pulmonary fibrosis, unspecified: Secondary | ICD-10-CM | POA: Diagnosis not present

## 2017-02-04 DIAGNOSIS — R531 Weakness: Secondary | ICD-10-CM | POA: Diagnosis not present

## 2017-02-04 DIAGNOSIS — R262 Difficulty in walking, not elsewhere classified: Secondary | ICD-10-CM | POA: Diagnosis not present

## 2017-02-04 DIAGNOSIS — R29898 Other symptoms and signs involving the musculoskeletal system: Secondary | ICD-10-CM | POA: Diagnosis not present

## 2017-02-04 DIAGNOSIS — M5440 Lumbago with sciatica, unspecified side: Secondary | ICD-10-CM | POA: Diagnosis not present

## 2017-02-04 DIAGNOSIS — J8410 Pulmonary fibrosis, unspecified: Secondary | ICD-10-CM | POA: Diagnosis not present

## 2017-02-09 DIAGNOSIS — R531 Weakness: Secondary | ICD-10-CM | POA: Diagnosis not present

## 2017-02-09 DIAGNOSIS — R262 Difficulty in walking, not elsewhere classified: Secondary | ICD-10-CM | POA: Diagnosis not present

## 2017-02-09 DIAGNOSIS — R29898 Other symptoms and signs involving the musculoskeletal system: Secondary | ICD-10-CM | POA: Diagnosis not present

## 2017-02-09 DIAGNOSIS — J8410 Pulmonary fibrosis, unspecified: Secondary | ICD-10-CM | POA: Diagnosis not present

## 2017-02-09 DIAGNOSIS — M5440 Lumbago with sciatica, unspecified side: Secondary | ICD-10-CM | POA: Diagnosis not present

## 2017-02-11 DIAGNOSIS — J8410 Pulmonary fibrosis, unspecified: Secondary | ICD-10-CM | POA: Diagnosis not present

## 2017-02-11 DIAGNOSIS — R262 Difficulty in walking, not elsewhere classified: Secondary | ICD-10-CM | POA: Diagnosis not present

## 2017-02-11 DIAGNOSIS — R29898 Other symptoms and signs involving the musculoskeletal system: Secondary | ICD-10-CM | POA: Diagnosis not present

## 2017-02-11 DIAGNOSIS — M5440 Lumbago with sciatica, unspecified side: Secondary | ICD-10-CM | POA: Diagnosis not present

## 2017-02-11 DIAGNOSIS — R531 Weakness: Secondary | ICD-10-CM | POA: Diagnosis not present

## 2017-02-15 DIAGNOSIS — D0472 Carcinoma in situ of skin of left lower limb, including hip: Secondary | ICD-10-CM | POA: Diagnosis not present

## 2017-02-16 DIAGNOSIS — R262 Difficulty in walking, not elsewhere classified: Secondary | ICD-10-CM | POA: Diagnosis not present

## 2017-02-16 DIAGNOSIS — J8410 Pulmonary fibrosis, unspecified: Secondary | ICD-10-CM | POA: Diagnosis not present

## 2017-02-16 DIAGNOSIS — R531 Weakness: Secondary | ICD-10-CM | POA: Diagnosis not present

## 2017-02-16 DIAGNOSIS — M5440 Lumbago with sciatica, unspecified side: Secondary | ICD-10-CM | POA: Diagnosis not present

## 2017-02-16 DIAGNOSIS — R29898 Other symptoms and signs involving the musculoskeletal system: Secondary | ICD-10-CM | POA: Diagnosis not present

## 2017-02-21 DIAGNOSIS — J8410 Pulmonary fibrosis, unspecified: Secondary | ICD-10-CM | POA: Diagnosis not present

## 2017-02-21 DIAGNOSIS — R262 Difficulty in walking, not elsewhere classified: Secondary | ICD-10-CM | POA: Diagnosis not present

## 2017-02-21 DIAGNOSIS — R29898 Other symptoms and signs involving the musculoskeletal system: Secondary | ICD-10-CM | POA: Diagnosis not present

## 2017-02-21 DIAGNOSIS — R531 Weakness: Secondary | ICD-10-CM | POA: Diagnosis not present

## 2017-02-21 DIAGNOSIS — M5440 Lumbago with sciatica, unspecified side: Secondary | ICD-10-CM | POA: Diagnosis not present

## 2017-02-23 DIAGNOSIS — M5440 Lumbago with sciatica, unspecified side: Secondary | ICD-10-CM | POA: Diagnosis not present

## 2017-02-23 DIAGNOSIS — R531 Weakness: Secondary | ICD-10-CM | POA: Diagnosis not present

## 2017-02-23 DIAGNOSIS — R29898 Other symptoms and signs involving the musculoskeletal system: Secondary | ICD-10-CM | POA: Diagnosis not present

## 2017-02-23 DIAGNOSIS — R262 Difficulty in walking, not elsewhere classified: Secondary | ICD-10-CM | POA: Diagnosis not present

## 2017-02-23 DIAGNOSIS — J8410 Pulmonary fibrosis, unspecified: Secondary | ICD-10-CM | POA: Diagnosis not present

## 2017-02-25 DIAGNOSIS — Z79899 Other long term (current) drug therapy: Secondary | ICD-10-CM | POA: Diagnosis not present

## 2017-02-25 DIAGNOSIS — M8580 Other specified disorders of bone density and structure, unspecified site: Secondary | ICD-10-CM | POA: Diagnosis not present

## 2017-02-25 DIAGNOSIS — R609 Edema, unspecified: Secondary | ICD-10-CM | POA: Diagnosis not present

## 2017-02-25 DIAGNOSIS — E039 Hypothyroidism, unspecified: Secondary | ICD-10-CM | POA: Diagnosis not present

## 2017-02-25 DIAGNOSIS — R1310 Dysphagia, unspecified: Secondary | ICD-10-CM | POA: Diagnosis not present

## 2017-02-25 DIAGNOSIS — R0609 Other forms of dyspnea: Secondary | ICD-10-CM | POA: Diagnosis not present

## 2017-02-28 DIAGNOSIS — S32010A Wedge compression fracture of first lumbar vertebra, initial encounter for closed fracture: Secondary | ICD-10-CM | POA: Diagnosis not present

## 2017-02-28 DIAGNOSIS — M47816 Spondylosis without myelopathy or radiculopathy, lumbar region: Secondary | ICD-10-CM | POA: Diagnosis not present

## 2017-02-28 DIAGNOSIS — M5440 Lumbago with sciatica, unspecified side: Secondary | ICD-10-CM | POA: Diagnosis not present

## 2017-02-28 DIAGNOSIS — R531 Weakness: Secondary | ICD-10-CM | POA: Diagnosis not present

## 2017-02-28 DIAGNOSIS — R29898 Other symptoms and signs involving the musculoskeletal system: Secondary | ICD-10-CM | POA: Diagnosis not present

## 2017-02-28 DIAGNOSIS — M545 Low back pain: Secondary | ICD-10-CM | POA: Diagnosis not present

## 2017-02-28 DIAGNOSIS — J8410 Pulmonary fibrosis, unspecified: Secondary | ICD-10-CM | POA: Diagnosis not present

## 2017-03-10 DIAGNOSIS — M545 Low back pain: Secondary | ICD-10-CM | POA: Diagnosis not present

## 2017-03-10 DIAGNOSIS — M47816 Spondylosis without myelopathy or radiculopathy, lumbar region: Secondary | ICD-10-CM | POA: Diagnosis not present

## 2017-03-10 DIAGNOSIS — S32010A Wedge compression fracture of first lumbar vertebra, initial encounter for closed fracture: Secondary | ICD-10-CM | POA: Diagnosis not present

## 2017-03-14 ENCOUNTER — Other Ambulatory Visit: Payer: Self-pay | Admitting: Orthopaedic Surgery

## 2017-03-14 DIAGNOSIS — M545 Low back pain: Secondary | ICD-10-CM | POA: Diagnosis not present

## 2017-03-14 DIAGNOSIS — Z4689 Encounter for fitting and adjustment of other specified devices: Secondary | ICD-10-CM | POA: Diagnosis not present

## 2017-03-14 DIAGNOSIS — M5127 Other intervertebral disc displacement, lumbosacral region: Secondary | ICD-10-CM | POA: Diagnosis not present

## 2017-03-14 DIAGNOSIS — M5136 Other intervertebral disc degeneration, lumbar region: Secondary | ICD-10-CM

## 2017-03-14 DIAGNOSIS — S32010A Wedge compression fracture of first lumbar vertebra, initial encounter for closed fracture: Secondary | ICD-10-CM | POA: Diagnosis not present

## 2017-03-18 DIAGNOSIS — S32010A Wedge compression fracture of first lumbar vertebra, initial encounter for closed fracture: Secondary | ICD-10-CM | POA: Diagnosis not present

## 2017-03-23 ENCOUNTER — Ambulatory Visit: Payer: Medicare Other | Admitting: Sports Medicine

## 2017-03-25 DIAGNOSIS — M419 Scoliosis, unspecified: Secondary | ICD-10-CM | POA: Diagnosis not present

## 2017-03-25 DIAGNOSIS — M545 Low back pain: Secondary | ICD-10-CM | POA: Diagnosis not present

## 2017-03-25 DIAGNOSIS — M4726 Other spondylosis with radiculopathy, lumbar region: Secondary | ICD-10-CM | POA: Diagnosis not present

## 2017-03-25 DIAGNOSIS — S32010A Wedge compression fracture of first lumbar vertebra, initial encounter for closed fracture: Secondary | ICD-10-CM | POA: Diagnosis not present

## 2017-03-30 DIAGNOSIS — M5431 Sciatica, right side: Secondary | ICD-10-CM | POA: Diagnosis not present

## 2017-04-06 DIAGNOSIS — M5432 Sciatica, left side: Secondary | ICD-10-CM | POA: Diagnosis not present

## 2017-04-06 DIAGNOSIS — M791 Myalgia, unspecified site: Secondary | ICD-10-CM | POA: Diagnosis not present

## 2017-04-13 ENCOUNTER — Ambulatory Visit: Payer: Medicare Other | Admitting: Sports Medicine

## 2017-04-19 DIAGNOSIS — M5432 Sciatica, left side: Secondary | ICD-10-CM | POA: Diagnosis not present

## 2017-04-29 DIAGNOSIS — G894 Chronic pain syndrome: Secondary | ICD-10-CM | POA: Diagnosis not present

## 2017-04-29 DIAGNOSIS — I10 Essential (primary) hypertension: Secondary | ICD-10-CM | POA: Diagnosis not present

## 2017-04-29 DIAGNOSIS — Z6822 Body mass index (BMI) 22.0-22.9, adult: Secondary | ICD-10-CM | POA: Diagnosis not present

## 2017-05-02 DIAGNOSIS — Z981 Arthrodesis status: Secondary | ICD-10-CM | POA: Diagnosis not present

## 2017-05-02 DIAGNOSIS — M1991 Primary osteoarthritis, unspecified site: Secondary | ICD-10-CM | POA: Diagnosis not present

## 2017-05-02 DIAGNOSIS — M5431 Sciatica, right side: Secondary | ICD-10-CM | POA: Diagnosis not present

## 2017-05-02 DIAGNOSIS — I10 Essential (primary) hypertension: Secondary | ICD-10-CM | POA: Diagnosis not present

## 2017-05-02 DIAGNOSIS — M5432 Sciatica, left side: Secondary | ICD-10-CM | POA: Diagnosis not present

## 2017-05-02 DIAGNOSIS — M47816 Spondylosis without myelopathy or radiculopathy, lumbar region: Secondary | ICD-10-CM | POA: Diagnosis not present

## 2017-05-04 ENCOUNTER — Ambulatory Visit: Payer: Medicare Other | Admitting: Sports Medicine

## 2017-05-05 DIAGNOSIS — M47816 Spondylosis without myelopathy or radiculopathy, lumbar region: Secondary | ICD-10-CM | POA: Diagnosis not present

## 2017-05-05 DIAGNOSIS — M1991 Primary osteoarthritis, unspecified site: Secondary | ICD-10-CM | POA: Diagnosis not present

## 2017-05-05 DIAGNOSIS — M5431 Sciatica, right side: Secondary | ICD-10-CM | POA: Diagnosis not present

## 2017-05-05 DIAGNOSIS — M5432 Sciatica, left side: Secondary | ICD-10-CM | POA: Diagnosis not present

## 2017-05-05 DIAGNOSIS — Z981 Arthrodesis status: Secondary | ICD-10-CM | POA: Diagnosis not present

## 2017-05-05 DIAGNOSIS — I10 Essential (primary) hypertension: Secondary | ICD-10-CM | POA: Diagnosis not present

## 2017-05-06 DIAGNOSIS — M5432 Sciatica, left side: Secondary | ICD-10-CM | POA: Diagnosis not present

## 2017-05-06 DIAGNOSIS — M1991 Primary osteoarthritis, unspecified site: Secondary | ICD-10-CM | POA: Diagnosis not present

## 2017-05-06 DIAGNOSIS — Z981 Arthrodesis status: Secondary | ICD-10-CM | POA: Diagnosis not present

## 2017-05-06 DIAGNOSIS — M5431 Sciatica, right side: Secondary | ICD-10-CM | POA: Diagnosis not present

## 2017-05-06 DIAGNOSIS — I10 Essential (primary) hypertension: Secondary | ICD-10-CM | POA: Diagnosis not present

## 2017-05-06 DIAGNOSIS — M47816 Spondylosis without myelopathy or radiculopathy, lumbar region: Secondary | ICD-10-CM | POA: Diagnosis not present

## 2017-05-11 DIAGNOSIS — M5432 Sciatica, left side: Secondary | ICD-10-CM | POA: Diagnosis not present

## 2017-05-11 DIAGNOSIS — M5431 Sciatica, right side: Secondary | ICD-10-CM | POA: Diagnosis not present

## 2017-05-11 DIAGNOSIS — M47816 Spondylosis without myelopathy or radiculopathy, lumbar region: Secondary | ICD-10-CM | POA: Diagnosis not present

## 2017-05-11 DIAGNOSIS — I10 Essential (primary) hypertension: Secondary | ICD-10-CM | POA: Diagnosis not present

## 2017-05-11 DIAGNOSIS — M1991 Primary osteoarthritis, unspecified site: Secondary | ICD-10-CM | POA: Diagnosis not present

## 2017-05-11 DIAGNOSIS — Z981 Arthrodesis status: Secondary | ICD-10-CM | POA: Diagnosis not present

## 2017-05-12 DIAGNOSIS — E039 Hypothyroidism, unspecified: Secondary | ICD-10-CM | POA: Diagnosis not present

## 2017-05-12 DIAGNOSIS — Z981 Arthrodesis status: Secondary | ICD-10-CM | POA: Diagnosis not present

## 2017-05-12 DIAGNOSIS — R7309 Other abnormal glucose: Secondary | ICD-10-CM | POA: Diagnosis not present

## 2017-05-12 DIAGNOSIS — M5431 Sciatica, right side: Secondary | ICD-10-CM | POA: Diagnosis not present

## 2017-05-12 DIAGNOSIS — J8410 Pulmonary fibrosis, unspecified: Secondary | ICD-10-CM | POA: Diagnosis not present

## 2017-05-12 DIAGNOSIS — M5432 Sciatica, left side: Secondary | ICD-10-CM | POA: Diagnosis not present

## 2017-05-12 DIAGNOSIS — I10 Essential (primary) hypertension: Secondary | ICD-10-CM | POA: Diagnosis not present

## 2017-05-12 DIAGNOSIS — M47816 Spondylosis without myelopathy or radiculopathy, lumbar region: Secondary | ICD-10-CM | POA: Diagnosis not present

## 2017-05-12 DIAGNOSIS — M1991 Primary osteoarthritis, unspecified site: Secondary | ICD-10-CM | POA: Diagnosis not present

## 2017-05-13 DIAGNOSIS — M47816 Spondylosis without myelopathy or radiculopathy, lumbar region: Secondary | ICD-10-CM | POA: Diagnosis not present

## 2017-05-13 DIAGNOSIS — Z981 Arthrodesis status: Secondary | ICD-10-CM | POA: Diagnosis not present

## 2017-05-13 DIAGNOSIS — I10 Essential (primary) hypertension: Secondary | ICD-10-CM | POA: Diagnosis not present

## 2017-05-13 DIAGNOSIS — M1991 Primary osteoarthritis, unspecified site: Secondary | ICD-10-CM | POA: Diagnosis not present

## 2017-05-13 DIAGNOSIS — M5432 Sciatica, left side: Secondary | ICD-10-CM | POA: Diagnosis not present

## 2017-05-13 DIAGNOSIS — M5431 Sciatica, right side: Secondary | ICD-10-CM | POA: Diagnosis not present

## 2017-05-17 DIAGNOSIS — I10 Essential (primary) hypertension: Secondary | ICD-10-CM | POA: Diagnosis not present

## 2017-05-17 DIAGNOSIS — M5431 Sciatica, right side: Secondary | ICD-10-CM | POA: Diagnosis not present

## 2017-05-17 DIAGNOSIS — M5432 Sciatica, left side: Secondary | ICD-10-CM | POA: Diagnosis not present

## 2017-05-17 DIAGNOSIS — Z981 Arthrodesis status: Secondary | ICD-10-CM | POA: Diagnosis not present

## 2017-05-17 DIAGNOSIS — M1991 Primary osteoarthritis, unspecified site: Secondary | ICD-10-CM | POA: Diagnosis not present

## 2017-05-17 DIAGNOSIS — M47816 Spondylosis without myelopathy or radiculopathy, lumbar region: Secondary | ICD-10-CM | POA: Diagnosis not present

## 2017-05-18 DIAGNOSIS — M5432 Sciatica, left side: Secondary | ICD-10-CM | POA: Diagnosis not present

## 2017-05-18 DIAGNOSIS — M1991 Primary osteoarthritis, unspecified site: Secondary | ICD-10-CM | POA: Diagnosis not present

## 2017-05-18 DIAGNOSIS — M47816 Spondylosis without myelopathy or radiculopathy, lumbar region: Secondary | ICD-10-CM | POA: Diagnosis not present

## 2017-05-18 DIAGNOSIS — M5431 Sciatica, right side: Secondary | ICD-10-CM | POA: Diagnosis not present

## 2017-05-18 DIAGNOSIS — Z981 Arthrodesis status: Secondary | ICD-10-CM | POA: Diagnosis not present

## 2017-05-18 DIAGNOSIS — I10 Essential (primary) hypertension: Secondary | ICD-10-CM | POA: Diagnosis not present

## 2017-05-19 DIAGNOSIS — M47816 Spondylosis without myelopathy or radiculopathy, lumbar region: Secondary | ICD-10-CM | POA: Diagnosis not present

## 2017-05-19 DIAGNOSIS — M1991 Primary osteoarthritis, unspecified site: Secondary | ICD-10-CM | POA: Diagnosis not present

## 2017-05-19 DIAGNOSIS — Z981 Arthrodesis status: Secondary | ICD-10-CM | POA: Diagnosis not present

## 2017-05-19 DIAGNOSIS — M5431 Sciatica, right side: Secondary | ICD-10-CM | POA: Diagnosis not present

## 2017-05-19 DIAGNOSIS — I10 Essential (primary) hypertension: Secondary | ICD-10-CM | POA: Diagnosis not present

## 2017-05-19 DIAGNOSIS — M5432 Sciatica, left side: Secondary | ICD-10-CM | POA: Diagnosis not present

## 2017-05-20 ENCOUNTER — Ambulatory Visit: Payer: Medicare Other | Admitting: Sports Medicine

## 2017-05-20 DIAGNOSIS — Z981 Arthrodesis status: Secondary | ICD-10-CM | POA: Diagnosis not present

## 2017-05-20 DIAGNOSIS — M1991 Primary osteoarthritis, unspecified site: Secondary | ICD-10-CM | POA: Diagnosis not present

## 2017-05-20 DIAGNOSIS — I10 Essential (primary) hypertension: Secondary | ICD-10-CM | POA: Diagnosis not present

## 2017-05-20 DIAGNOSIS — M5431 Sciatica, right side: Secondary | ICD-10-CM | POA: Diagnosis not present

## 2017-05-20 DIAGNOSIS — M5432 Sciatica, left side: Secondary | ICD-10-CM | POA: Diagnosis not present

## 2017-05-20 DIAGNOSIS — M47816 Spondylosis without myelopathy or radiculopathy, lumbar region: Secondary | ICD-10-CM | POA: Diagnosis not present

## 2017-05-23 DIAGNOSIS — M47816 Spondylosis without myelopathy or radiculopathy, lumbar region: Secondary | ICD-10-CM | POA: Diagnosis not present

## 2017-05-23 DIAGNOSIS — I10 Essential (primary) hypertension: Secondary | ICD-10-CM | POA: Diagnosis not present

## 2017-05-23 DIAGNOSIS — M1991 Primary osteoarthritis, unspecified site: Secondary | ICD-10-CM | POA: Diagnosis not present

## 2017-05-23 DIAGNOSIS — Z981 Arthrodesis status: Secondary | ICD-10-CM | POA: Diagnosis not present

## 2017-05-23 DIAGNOSIS — M5432 Sciatica, left side: Secondary | ICD-10-CM | POA: Diagnosis not present

## 2017-05-23 DIAGNOSIS — M5431 Sciatica, right side: Secondary | ICD-10-CM | POA: Diagnosis not present

## 2017-05-25 DIAGNOSIS — Z981 Arthrodesis status: Secondary | ICD-10-CM | POA: Diagnosis not present

## 2017-05-25 DIAGNOSIS — M5432 Sciatica, left side: Secondary | ICD-10-CM | POA: Diagnosis not present

## 2017-05-25 DIAGNOSIS — I10 Essential (primary) hypertension: Secondary | ICD-10-CM | POA: Diagnosis not present

## 2017-05-25 DIAGNOSIS — M1991 Primary osteoarthritis, unspecified site: Secondary | ICD-10-CM | POA: Diagnosis not present

## 2017-05-25 DIAGNOSIS — M47816 Spondylosis without myelopathy or radiculopathy, lumbar region: Secondary | ICD-10-CM | POA: Diagnosis not present

## 2017-05-25 DIAGNOSIS — M5431 Sciatica, right side: Secondary | ICD-10-CM | POA: Diagnosis not present

## 2017-05-27 DIAGNOSIS — I10 Essential (primary) hypertension: Secondary | ICD-10-CM | POA: Diagnosis not present

## 2017-05-27 DIAGNOSIS — M1991 Primary osteoarthritis, unspecified site: Secondary | ICD-10-CM | POA: Diagnosis not present

## 2017-05-27 DIAGNOSIS — M47816 Spondylosis without myelopathy or radiculopathy, lumbar region: Secondary | ICD-10-CM | POA: Diagnosis not present

## 2017-05-27 DIAGNOSIS — Z981 Arthrodesis status: Secondary | ICD-10-CM | POA: Diagnosis not present

## 2017-05-27 DIAGNOSIS — M5431 Sciatica, right side: Secondary | ICD-10-CM | POA: Diagnosis not present

## 2017-05-27 DIAGNOSIS — M5432 Sciatica, left side: Secondary | ICD-10-CM | POA: Diagnosis not present

## 2017-05-30 DIAGNOSIS — I10 Essential (primary) hypertension: Secondary | ICD-10-CM | POA: Diagnosis not present

## 2017-05-30 DIAGNOSIS — M47816 Spondylosis without myelopathy or radiculopathy, lumbar region: Secondary | ICD-10-CM | POA: Diagnosis not present

## 2017-05-30 DIAGNOSIS — M1991 Primary osteoarthritis, unspecified site: Secondary | ICD-10-CM | POA: Diagnosis not present

## 2017-05-30 DIAGNOSIS — Z981 Arthrodesis status: Secondary | ICD-10-CM | POA: Diagnosis not present

## 2017-05-30 DIAGNOSIS — M5431 Sciatica, right side: Secondary | ICD-10-CM | POA: Diagnosis not present

## 2017-05-30 DIAGNOSIS — M5432 Sciatica, left side: Secondary | ICD-10-CM | POA: Diagnosis not present

## 2017-06-01 DIAGNOSIS — M5431 Sciatica, right side: Secondary | ICD-10-CM | POA: Diagnosis not present

## 2017-06-01 DIAGNOSIS — M1991 Primary osteoarthritis, unspecified site: Secondary | ICD-10-CM | POA: Diagnosis not present

## 2017-06-01 DIAGNOSIS — M47816 Spondylosis without myelopathy or radiculopathy, lumbar region: Secondary | ICD-10-CM | POA: Diagnosis not present

## 2017-06-01 DIAGNOSIS — I10 Essential (primary) hypertension: Secondary | ICD-10-CM | POA: Diagnosis not present

## 2017-06-01 DIAGNOSIS — M5432 Sciatica, left side: Secondary | ICD-10-CM | POA: Diagnosis not present

## 2017-06-01 DIAGNOSIS — Z981 Arthrodesis status: Secondary | ICD-10-CM | POA: Diagnosis not present

## 2017-06-02 DIAGNOSIS — M1991 Primary osteoarthritis, unspecified site: Secondary | ICD-10-CM | POA: Diagnosis not present

## 2017-06-02 DIAGNOSIS — M5432 Sciatica, left side: Secondary | ICD-10-CM | POA: Diagnosis not present

## 2017-06-02 DIAGNOSIS — M47816 Spondylosis without myelopathy or radiculopathy, lumbar region: Secondary | ICD-10-CM | POA: Diagnosis not present

## 2017-06-02 DIAGNOSIS — I10 Essential (primary) hypertension: Secondary | ICD-10-CM | POA: Diagnosis not present

## 2017-06-02 DIAGNOSIS — Z981 Arthrodesis status: Secondary | ICD-10-CM | POA: Diagnosis not present

## 2017-06-02 DIAGNOSIS — M5431 Sciatica, right side: Secondary | ICD-10-CM | POA: Diagnosis not present

## 2017-06-03 DIAGNOSIS — I10 Essential (primary) hypertension: Secondary | ICD-10-CM | POA: Diagnosis not present

## 2017-06-03 DIAGNOSIS — M1991 Primary osteoarthritis, unspecified site: Secondary | ICD-10-CM | POA: Diagnosis not present

## 2017-06-03 DIAGNOSIS — M47816 Spondylosis without myelopathy or radiculopathy, lumbar region: Secondary | ICD-10-CM | POA: Diagnosis not present

## 2017-06-03 DIAGNOSIS — Z981 Arthrodesis status: Secondary | ICD-10-CM | POA: Diagnosis not present

## 2017-06-03 DIAGNOSIS — M5432 Sciatica, left side: Secondary | ICD-10-CM | POA: Diagnosis not present

## 2017-06-03 DIAGNOSIS — M5431 Sciatica, right side: Secondary | ICD-10-CM | POA: Diagnosis not present

## 2017-06-06 DIAGNOSIS — M5431 Sciatica, right side: Secondary | ICD-10-CM | POA: Diagnosis not present

## 2017-06-06 DIAGNOSIS — Z981 Arthrodesis status: Secondary | ICD-10-CM | POA: Diagnosis not present

## 2017-06-06 DIAGNOSIS — M1991 Primary osteoarthritis, unspecified site: Secondary | ICD-10-CM | POA: Diagnosis not present

## 2017-06-06 DIAGNOSIS — M47816 Spondylosis without myelopathy or radiculopathy, lumbar region: Secondary | ICD-10-CM | POA: Diagnosis not present

## 2017-06-06 DIAGNOSIS — M5432 Sciatica, left side: Secondary | ICD-10-CM | POA: Diagnosis not present

## 2017-06-06 DIAGNOSIS — I10 Essential (primary) hypertension: Secondary | ICD-10-CM | POA: Diagnosis not present

## 2017-06-07 DIAGNOSIS — I10 Essential (primary) hypertension: Secondary | ICD-10-CM | POA: Diagnosis not present

## 2017-06-07 DIAGNOSIS — M1991 Primary osteoarthritis, unspecified site: Secondary | ICD-10-CM | POA: Diagnosis not present

## 2017-06-07 DIAGNOSIS — M5431 Sciatica, right side: Secondary | ICD-10-CM | POA: Diagnosis not present

## 2017-06-07 DIAGNOSIS — M5432 Sciatica, left side: Secondary | ICD-10-CM | POA: Diagnosis not present

## 2017-06-07 DIAGNOSIS — Z981 Arthrodesis status: Secondary | ICD-10-CM | POA: Diagnosis not present

## 2017-06-07 DIAGNOSIS — M47816 Spondylosis without myelopathy or radiculopathy, lumbar region: Secondary | ICD-10-CM | POA: Diagnosis not present

## 2017-06-08 DIAGNOSIS — Z981 Arthrodesis status: Secondary | ICD-10-CM | POA: Diagnosis not present

## 2017-06-08 DIAGNOSIS — M47816 Spondylosis without myelopathy or radiculopathy, lumbar region: Secondary | ICD-10-CM | POA: Diagnosis not present

## 2017-06-08 DIAGNOSIS — I10 Essential (primary) hypertension: Secondary | ICD-10-CM | POA: Diagnosis not present

## 2017-06-08 DIAGNOSIS — M5432 Sciatica, left side: Secondary | ICD-10-CM | POA: Diagnosis not present

## 2017-06-08 DIAGNOSIS — M5431 Sciatica, right side: Secondary | ICD-10-CM | POA: Diagnosis not present

## 2017-06-08 DIAGNOSIS — M1991 Primary osteoarthritis, unspecified site: Secondary | ICD-10-CM | POA: Diagnosis not present

## 2017-06-10 DIAGNOSIS — I10 Essential (primary) hypertension: Secondary | ICD-10-CM | POA: Diagnosis not present

## 2017-06-10 DIAGNOSIS — Z981 Arthrodesis status: Secondary | ICD-10-CM | POA: Diagnosis not present

## 2017-06-10 DIAGNOSIS — M5432 Sciatica, left side: Secondary | ICD-10-CM | POA: Diagnosis not present

## 2017-06-10 DIAGNOSIS — M47816 Spondylosis without myelopathy or radiculopathy, lumbar region: Secondary | ICD-10-CM | POA: Diagnosis not present

## 2017-06-10 DIAGNOSIS — M1991 Primary osteoarthritis, unspecified site: Secondary | ICD-10-CM | POA: Diagnosis not present

## 2017-06-10 DIAGNOSIS — M5431 Sciatica, right side: Secondary | ICD-10-CM | POA: Diagnosis not present

## 2017-06-13 DIAGNOSIS — M47816 Spondylosis without myelopathy or radiculopathy, lumbar region: Secondary | ICD-10-CM | POA: Diagnosis not present

## 2017-06-13 DIAGNOSIS — Z981 Arthrodesis status: Secondary | ICD-10-CM | POA: Diagnosis not present

## 2017-06-13 DIAGNOSIS — M5431 Sciatica, right side: Secondary | ICD-10-CM | POA: Diagnosis not present

## 2017-06-13 DIAGNOSIS — M1991 Primary osteoarthritis, unspecified site: Secondary | ICD-10-CM | POA: Diagnosis not present

## 2017-06-13 DIAGNOSIS — M5432 Sciatica, left side: Secondary | ICD-10-CM | POA: Diagnosis not present

## 2017-06-13 DIAGNOSIS — I10 Essential (primary) hypertension: Secondary | ICD-10-CM | POA: Diagnosis not present

## 2017-06-15 DIAGNOSIS — M1991 Primary osteoarthritis, unspecified site: Secondary | ICD-10-CM | POA: Diagnosis not present

## 2017-06-15 DIAGNOSIS — Z981 Arthrodesis status: Secondary | ICD-10-CM | POA: Diagnosis not present

## 2017-06-15 DIAGNOSIS — M5431 Sciatica, right side: Secondary | ICD-10-CM | POA: Diagnosis not present

## 2017-06-15 DIAGNOSIS — M47816 Spondylosis without myelopathy or radiculopathy, lumbar region: Secondary | ICD-10-CM | POA: Diagnosis not present

## 2017-06-15 DIAGNOSIS — I10 Essential (primary) hypertension: Secondary | ICD-10-CM | POA: Diagnosis not present

## 2017-06-15 DIAGNOSIS — M5432 Sciatica, left side: Secondary | ICD-10-CM | POA: Diagnosis not present

## 2017-06-15 DIAGNOSIS — M7918 Myalgia, other site: Secondary | ICD-10-CM | POA: Diagnosis not present

## 2017-06-15 DIAGNOSIS — M419 Scoliosis, unspecified: Secondary | ICD-10-CM | POA: Diagnosis not present

## 2017-06-16 DIAGNOSIS — I10 Essential (primary) hypertension: Secondary | ICD-10-CM | POA: Diagnosis not present

## 2017-06-16 DIAGNOSIS — M1991 Primary osteoarthritis, unspecified site: Secondary | ICD-10-CM | POA: Diagnosis not present

## 2017-06-16 DIAGNOSIS — M5432 Sciatica, left side: Secondary | ICD-10-CM | POA: Diagnosis not present

## 2017-06-16 DIAGNOSIS — M5431 Sciatica, right side: Secondary | ICD-10-CM | POA: Diagnosis not present

## 2017-06-16 DIAGNOSIS — M47816 Spondylosis without myelopathy or radiculopathy, lumbar region: Secondary | ICD-10-CM | POA: Diagnosis not present

## 2017-06-16 DIAGNOSIS — Z981 Arthrodesis status: Secondary | ICD-10-CM | POA: Diagnosis not present

## 2017-06-17 DIAGNOSIS — I10 Essential (primary) hypertension: Secondary | ICD-10-CM | POA: Diagnosis not present

## 2017-06-17 DIAGNOSIS — M47816 Spondylosis without myelopathy or radiculopathy, lumbar region: Secondary | ICD-10-CM | POA: Diagnosis not present

## 2017-06-17 DIAGNOSIS — Z981 Arthrodesis status: Secondary | ICD-10-CM | POA: Diagnosis not present

## 2017-06-17 DIAGNOSIS — M5432 Sciatica, left side: Secondary | ICD-10-CM | POA: Diagnosis not present

## 2017-06-17 DIAGNOSIS — M5431 Sciatica, right side: Secondary | ICD-10-CM | POA: Diagnosis not present

## 2017-06-17 DIAGNOSIS — M1991 Primary osteoarthritis, unspecified site: Secondary | ICD-10-CM | POA: Diagnosis not present

## 2017-06-20 DIAGNOSIS — M47816 Spondylosis without myelopathy or radiculopathy, lumbar region: Secondary | ICD-10-CM | POA: Diagnosis not present

## 2017-06-20 DIAGNOSIS — I10 Essential (primary) hypertension: Secondary | ICD-10-CM | POA: Diagnosis not present

## 2017-06-20 DIAGNOSIS — M5431 Sciatica, right side: Secondary | ICD-10-CM | POA: Diagnosis not present

## 2017-06-20 DIAGNOSIS — M5432 Sciatica, left side: Secondary | ICD-10-CM | POA: Diagnosis not present

## 2017-06-20 DIAGNOSIS — Z981 Arthrodesis status: Secondary | ICD-10-CM | POA: Diagnosis not present

## 2017-06-20 DIAGNOSIS — M1991 Primary osteoarthritis, unspecified site: Secondary | ICD-10-CM | POA: Diagnosis not present

## 2017-06-22 DIAGNOSIS — M5432 Sciatica, left side: Secondary | ICD-10-CM | POA: Diagnosis not present

## 2017-06-22 DIAGNOSIS — I10 Essential (primary) hypertension: Secondary | ICD-10-CM | POA: Diagnosis not present

## 2017-06-22 DIAGNOSIS — M1991 Primary osteoarthritis, unspecified site: Secondary | ICD-10-CM | POA: Diagnosis not present

## 2017-06-22 DIAGNOSIS — M5431 Sciatica, right side: Secondary | ICD-10-CM | POA: Diagnosis not present

## 2017-06-22 DIAGNOSIS — Z981 Arthrodesis status: Secondary | ICD-10-CM | POA: Diagnosis not present

## 2017-06-22 DIAGNOSIS — M47816 Spondylosis without myelopathy or radiculopathy, lumbar region: Secondary | ICD-10-CM | POA: Diagnosis not present

## 2017-06-23 DIAGNOSIS — I10 Essential (primary) hypertension: Secondary | ICD-10-CM | POA: Diagnosis not present

## 2017-06-23 DIAGNOSIS — Z981 Arthrodesis status: Secondary | ICD-10-CM | POA: Diagnosis not present

## 2017-06-23 DIAGNOSIS — M5431 Sciatica, right side: Secondary | ICD-10-CM | POA: Diagnosis not present

## 2017-06-23 DIAGNOSIS — M5432 Sciatica, left side: Secondary | ICD-10-CM | POA: Diagnosis not present

## 2017-06-23 DIAGNOSIS — M47816 Spondylosis without myelopathy or radiculopathy, lumbar region: Secondary | ICD-10-CM | POA: Diagnosis not present

## 2017-06-23 DIAGNOSIS — M1991 Primary osteoarthritis, unspecified site: Secondary | ICD-10-CM | POA: Diagnosis not present

## 2017-06-24 DIAGNOSIS — M1991 Primary osteoarthritis, unspecified site: Secondary | ICD-10-CM | POA: Diagnosis not present

## 2017-06-24 DIAGNOSIS — M5431 Sciatica, right side: Secondary | ICD-10-CM | POA: Diagnosis not present

## 2017-06-24 DIAGNOSIS — M47816 Spondylosis without myelopathy or radiculopathy, lumbar region: Secondary | ICD-10-CM | POA: Diagnosis not present

## 2017-06-24 DIAGNOSIS — M5432 Sciatica, left side: Secondary | ICD-10-CM | POA: Diagnosis not present

## 2017-06-24 DIAGNOSIS — I10 Essential (primary) hypertension: Secondary | ICD-10-CM | POA: Diagnosis not present

## 2017-06-24 DIAGNOSIS — Z981 Arthrodesis status: Secondary | ICD-10-CM | POA: Diagnosis not present

## 2017-06-28 DIAGNOSIS — M47816 Spondylosis without myelopathy or radiculopathy, lumbar region: Secondary | ICD-10-CM | POA: Diagnosis not present

## 2017-06-28 DIAGNOSIS — M1991 Primary osteoarthritis, unspecified site: Secondary | ICD-10-CM | POA: Diagnosis not present

## 2017-06-28 DIAGNOSIS — Z981 Arthrodesis status: Secondary | ICD-10-CM | POA: Diagnosis not present

## 2017-06-28 DIAGNOSIS — M5432 Sciatica, left side: Secondary | ICD-10-CM | POA: Diagnosis not present

## 2017-06-28 DIAGNOSIS — I10 Essential (primary) hypertension: Secondary | ICD-10-CM | POA: Diagnosis not present

## 2017-06-28 DIAGNOSIS — M5431 Sciatica, right side: Secondary | ICD-10-CM | POA: Diagnosis not present

## 2017-07-01 DIAGNOSIS — M5431 Sciatica, right side: Secondary | ICD-10-CM | POA: Diagnosis not present

## 2017-07-01 DIAGNOSIS — M5432 Sciatica, left side: Secondary | ICD-10-CM | POA: Diagnosis not present

## 2017-07-01 DIAGNOSIS — Z981 Arthrodesis status: Secondary | ICD-10-CM | POA: Diagnosis not present

## 2017-07-01 DIAGNOSIS — M47816 Spondylosis without myelopathy or radiculopathy, lumbar region: Secondary | ICD-10-CM | POA: Diagnosis not present

## 2017-07-01 DIAGNOSIS — I10 Essential (primary) hypertension: Secondary | ICD-10-CM | POA: Diagnosis not present

## 2017-07-01 DIAGNOSIS — M1991 Primary osteoarthritis, unspecified site: Secondary | ICD-10-CM | POA: Diagnosis not present

## 2017-07-05 DIAGNOSIS — M5432 Sciatica, left side: Secondary | ICD-10-CM | POA: Diagnosis not present

## 2017-07-05 DIAGNOSIS — M5431 Sciatica, right side: Secondary | ICD-10-CM | POA: Diagnosis not present

## 2017-07-05 DIAGNOSIS — M47816 Spondylosis without myelopathy or radiculopathy, lumbar region: Secondary | ICD-10-CM | POA: Diagnosis not present

## 2017-07-05 DIAGNOSIS — M1991 Primary osteoarthritis, unspecified site: Secondary | ICD-10-CM | POA: Diagnosis not present

## 2017-07-05 DIAGNOSIS — Z981 Arthrodesis status: Secondary | ICD-10-CM | POA: Diagnosis not present

## 2017-07-05 DIAGNOSIS — I10 Essential (primary) hypertension: Secondary | ICD-10-CM | POA: Diagnosis not present

## 2017-07-06 DIAGNOSIS — M5431 Sciatica, right side: Secondary | ICD-10-CM | POA: Diagnosis not present

## 2017-07-06 DIAGNOSIS — Z981 Arthrodesis status: Secondary | ICD-10-CM | POA: Diagnosis not present

## 2017-07-06 DIAGNOSIS — I10 Essential (primary) hypertension: Secondary | ICD-10-CM | POA: Diagnosis not present

## 2017-07-06 DIAGNOSIS — M5432 Sciatica, left side: Secondary | ICD-10-CM | POA: Diagnosis not present

## 2017-07-06 DIAGNOSIS — M47816 Spondylosis without myelopathy or radiculopathy, lumbar region: Secondary | ICD-10-CM | POA: Diagnosis not present

## 2017-07-06 DIAGNOSIS — M1991 Primary osteoarthritis, unspecified site: Secondary | ICD-10-CM | POA: Diagnosis not present

## 2017-07-07 DIAGNOSIS — M5432 Sciatica, left side: Secondary | ICD-10-CM | POA: Diagnosis not present

## 2017-07-07 DIAGNOSIS — M5431 Sciatica, right side: Secondary | ICD-10-CM | POA: Diagnosis not present

## 2017-07-07 DIAGNOSIS — I10 Essential (primary) hypertension: Secondary | ICD-10-CM | POA: Diagnosis not present

## 2017-07-07 DIAGNOSIS — Z981 Arthrodesis status: Secondary | ICD-10-CM | POA: Diagnosis not present

## 2017-07-07 DIAGNOSIS — M47816 Spondylosis without myelopathy or radiculopathy, lumbar region: Secondary | ICD-10-CM | POA: Diagnosis not present

## 2017-07-07 DIAGNOSIS — M1991 Primary osteoarthritis, unspecified site: Secondary | ICD-10-CM | POA: Diagnosis not present

## 2017-07-12 DIAGNOSIS — M47816 Spondylosis without myelopathy or radiculopathy, lumbar region: Secondary | ICD-10-CM | POA: Diagnosis not present

## 2017-07-12 DIAGNOSIS — M5431 Sciatica, right side: Secondary | ICD-10-CM | POA: Diagnosis not present

## 2017-07-12 DIAGNOSIS — M5432 Sciatica, left side: Secondary | ICD-10-CM | POA: Diagnosis not present

## 2017-07-12 DIAGNOSIS — Z981 Arthrodesis status: Secondary | ICD-10-CM | POA: Diagnosis not present

## 2017-07-12 DIAGNOSIS — M1991 Primary osteoarthritis, unspecified site: Secondary | ICD-10-CM | POA: Diagnosis not present

## 2017-07-12 DIAGNOSIS — I10 Essential (primary) hypertension: Secondary | ICD-10-CM | POA: Diagnosis not present

## 2017-07-13 DIAGNOSIS — M47816 Spondylosis without myelopathy or radiculopathy, lumbar region: Secondary | ICD-10-CM | POA: Diagnosis not present

## 2017-07-13 DIAGNOSIS — Z981 Arthrodesis status: Secondary | ICD-10-CM | POA: Diagnosis not present

## 2017-07-13 DIAGNOSIS — M5432 Sciatica, left side: Secondary | ICD-10-CM | POA: Diagnosis not present

## 2017-07-13 DIAGNOSIS — M5431 Sciatica, right side: Secondary | ICD-10-CM | POA: Diagnosis not present

## 2017-07-13 DIAGNOSIS — M1991 Primary osteoarthritis, unspecified site: Secondary | ICD-10-CM | POA: Diagnosis not present

## 2017-07-13 DIAGNOSIS — I10 Essential (primary) hypertension: Secondary | ICD-10-CM | POA: Diagnosis not present

## 2017-07-14 DIAGNOSIS — Z981 Arthrodesis status: Secondary | ICD-10-CM | POA: Diagnosis not present

## 2017-07-14 DIAGNOSIS — M5432 Sciatica, left side: Secondary | ICD-10-CM | POA: Diagnosis not present

## 2017-07-14 DIAGNOSIS — I10 Essential (primary) hypertension: Secondary | ICD-10-CM | POA: Diagnosis not present

## 2017-07-14 DIAGNOSIS — M1991 Primary osteoarthritis, unspecified site: Secondary | ICD-10-CM | POA: Diagnosis not present

## 2017-07-14 DIAGNOSIS — M5431 Sciatica, right side: Secondary | ICD-10-CM | POA: Diagnosis not present

## 2017-07-14 DIAGNOSIS — M47816 Spondylosis without myelopathy or radiculopathy, lumbar region: Secondary | ICD-10-CM | POA: Diagnosis not present

## 2017-07-19 DIAGNOSIS — M5431 Sciatica, right side: Secondary | ICD-10-CM | POA: Diagnosis not present

## 2017-07-19 DIAGNOSIS — M47816 Spondylosis without myelopathy or radiculopathy, lumbar region: Secondary | ICD-10-CM | POA: Diagnosis not present

## 2017-07-19 DIAGNOSIS — Z981 Arthrodesis status: Secondary | ICD-10-CM | POA: Diagnosis not present

## 2017-07-19 DIAGNOSIS — M5432 Sciatica, left side: Secondary | ICD-10-CM | POA: Diagnosis not present

## 2017-07-19 DIAGNOSIS — M1991 Primary osteoarthritis, unspecified site: Secondary | ICD-10-CM | POA: Diagnosis not present

## 2017-07-19 DIAGNOSIS — I10 Essential (primary) hypertension: Secondary | ICD-10-CM | POA: Diagnosis not present

## 2017-07-20 DIAGNOSIS — I10 Essential (primary) hypertension: Secondary | ICD-10-CM | POA: Diagnosis not present

## 2017-07-20 DIAGNOSIS — M5431 Sciatica, right side: Secondary | ICD-10-CM | POA: Diagnosis not present

## 2017-07-20 DIAGNOSIS — M5432 Sciatica, left side: Secondary | ICD-10-CM | POA: Diagnosis not present

## 2017-07-20 DIAGNOSIS — M1991 Primary osteoarthritis, unspecified site: Secondary | ICD-10-CM | POA: Diagnosis not present

## 2017-07-20 DIAGNOSIS — Z981 Arthrodesis status: Secondary | ICD-10-CM | POA: Diagnosis not present

## 2017-07-20 DIAGNOSIS — M47816 Spondylosis without myelopathy or radiculopathy, lumbar region: Secondary | ICD-10-CM | POA: Diagnosis not present

## 2017-07-21 DIAGNOSIS — Z981 Arthrodesis status: Secondary | ICD-10-CM | POA: Diagnosis not present

## 2017-07-21 DIAGNOSIS — M47816 Spondylosis without myelopathy or radiculopathy, lumbar region: Secondary | ICD-10-CM | POA: Diagnosis not present

## 2017-07-21 DIAGNOSIS — M5431 Sciatica, right side: Secondary | ICD-10-CM | POA: Diagnosis not present

## 2017-07-21 DIAGNOSIS — I10 Essential (primary) hypertension: Secondary | ICD-10-CM | POA: Diagnosis not present

## 2017-07-21 DIAGNOSIS — M5432 Sciatica, left side: Secondary | ICD-10-CM | POA: Diagnosis not present

## 2017-07-21 DIAGNOSIS — M1991 Primary osteoarthritis, unspecified site: Secondary | ICD-10-CM | POA: Diagnosis not present

## 2017-07-26 DIAGNOSIS — Z981 Arthrodesis status: Secondary | ICD-10-CM | POA: Diagnosis not present

## 2017-07-26 DIAGNOSIS — M5431 Sciatica, right side: Secondary | ICD-10-CM | POA: Diagnosis not present

## 2017-07-26 DIAGNOSIS — M47816 Spondylosis without myelopathy or radiculopathy, lumbar region: Secondary | ICD-10-CM | POA: Diagnosis not present

## 2017-07-26 DIAGNOSIS — M1991 Primary osteoarthritis, unspecified site: Secondary | ICD-10-CM | POA: Diagnosis not present

## 2017-07-26 DIAGNOSIS — M5432 Sciatica, left side: Secondary | ICD-10-CM | POA: Diagnosis not present

## 2017-07-26 DIAGNOSIS — I10 Essential (primary) hypertension: Secondary | ICD-10-CM | POA: Diagnosis not present

## 2017-07-27 DIAGNOSIS — I10 Essential (primary) hypertension: Secondary | ICD-10-CM | POA: Diagnosis not present

## 2017-07-27 DIAGNOSIS — M47816 Spondylosis without myelopathy or radiculopathy, lumbar region: Secondary | ICD-10-CM | POA: Diagnosis not present

## 2017-07-27 DIAGNOSIS — Z981 Arthrodesis status: Secondary | ICD-10-CM | POA: Diagnosis not present

## 2017-07-27 DIAGNOSIS — M5431 Sciatica, right side: Secondary | ICD-10-CM | POA: Diagnosis not present

## 2017-07-27 DIAGNOSIS — M1991 Primary osteoarthritis, unspecified site: Secondary | ICD-10-CM | POA: Diagnosis not present

## 2017-07-27 DIAGNOSIS — M5432 Sciatica, left side: Secondary | ICD-10-CM | POA: Diagnosis not present

## 2017-07-28 DIAGNOSIS — M5431 Sciatica, right side: Secondary | ICD-10-CM | POA: Diagnosis not present

## 2017-07-28 DIAGNOSIS — M5432 Sciatica, left side: Secondary | ICD-10-CM | POA: Diagnosis not present

## 2017-07-28 DIAGNOSIS — M47816 Spondylosis without myelopathy or radiculopathy, lumbar region: Secondary | ICD-10-CM | POA: Diagnosis not present

## 2017-07-28 DIAGNOSIS — I10 Essential (primary) hypertension: Secondary | ICD-10-CM | POA: Diagnosis not present

## 2017-07-28 DIAGNOSIS — Z981 Arthrodesis status: Secondary | ICD-10-CM | POA: Diagnosis not present

## 2017-07-28 DIAGNOSIS — M1991 Primary osteoarthritis, unspecified site: Secondary | ICD-10-CM | POA: Diagnosis not present

## 2017-08-02 DIAGNOSIS — M1991 Primary osteoarthritis, unspecified site: Secondary | ICD-10-CM | POA: Diagnosis not present

## 2017-08-02 DIAGNOSIS — I10 Essential (primary) hypertension: Secondary | ICD-10-CM | POA: Diagnosis not present

## 2017-08-02 DIAGNOSIS — Z981 Arthrodesis status: Secondary | ICD-10-CM | POA: Diagnosis not present

## 2017-08-02 DIAGNOSIS — M5432 Sciatica, left side: Secondary | ICD-10-CM | POA: Diagnosis not present

## 2017-08-02 DIAGNOSIS — M47816 Spondylosis without myelopathy or radiculopathy, lumbar region: Secondary | ICD-10-CM | POA: Diagnosis not present

## 2017-08-02 DIAGNOSIS — M5431 Sciatica, right side: Secondary | ICD-10-CM | POA: Diagnosis not present

## 2017-08-04 DIAGNOSIS — M5431 Sciatica, right side: Secondary | ICD-10-CM | POA: Diagnosis not present

## 2017-08-04 DIAGNOSIS — M47816 Spondylosis without myelopathy or radiculopathy, lumbar region: Secondary | ICD-10-CM | POA: Diagnosis not present

## 2017-08-04 DIAGNOSIS — M1991 Primary osteoarthritis, unspecified site: Secondary | ICD-10-CM | POA: Diagnosis not present

## 2017-08-04 DIAGNOSIS — I10 Essential (primary) hypertension: Secondary | ICD-10-CM | POA: Diagnosis not present

## 2017-08-04 DIAGNOSIS — M5432 Sciatica, left side: Secondary | ICD-10-CM | POA: Diagnosis not present

## 2017-08-04 DIAGNOSIS — Z981 Arthrodesis status: Secondary | ICD-10-CM | POA: Diagnosis not present

## 2017-08-08 DIAGNOSIS — S32010S Wedge compression fracture of first lumbar vertebra, sequela: Secondary | ICD-10-CM | POA: Diagnosis not present

## 2017-08-08 DIAGNOSIS — M4848XA Fatigue fracture of vertebra, sacral and sacrococcygeal region, initial encounter for fracture: Secondary | ICD-10-CM | POA: Diagnosis not present

## 2017-08-09 ENCOUNTER — Other Ambulatory Visit: Payer: Self-pay | Admitting: Rehabilitation

## 2017-08-09 DIAGNOSIS — M4848XA Fatigue fracture of vertebra, sacral and sacrococcygeal region, initial encounter for fracture: Secondary | ICD-10-CM

## 2017-08-10 ENCOUNTER — Other Ambulatory Visit: Payer: Medicare Other

## 2017-08-10 DIAGNOSIS — Z981 Arthrodesis status: Secondary | ICD-10-CM | POA: Diagnosis not present

## 2017-08-10 DIAGNOSIS — M5432 Sciatica, left side: Secondary | ICD-10-CM | POA: Diagnosis not present

## 2017-08-10 DIAGNOSIS — M1991 Primary osteoarthritis, unspecified site: Secondary | ICD-10-CM | POA: Diagnosis not present

## 2017-08-10 DIAGNOSIS — M5431 Sciatica, right side: Secondary | ICD-10-CM | POA: Diagnosis not present

## 2017-08-10 DIAGNOSIS — M47816 Spondylosis without myelopathy or radiculopathy, lumbar region: Secondary | ICD-10-CM | POA: Diagnosis not present

## 2017-08-10 DIAGNOSIS — I10 Essential (primary) hypertension: Secondary | ICD-10-CM | POA: Diagnosis not present

## 2017-08-13 DIAGNOSIS — M7062 Trochanteric bursitis, left hip: Secondary | ICD-10-CM | POA: Diagnosis not present

## 2017-08-13 DIAGNOSIS — R52 Pain, unspecified: Secondary | ICD-10-CM | POA: Diagnosis not present

## 2017-08-16 DIAGNOSIS — M545 Low back pain: Secondary | ICD-10-CM | POA: Diagnosis not present

## 2017-08-16 DIAGNOSIS — M4848XA Fatigue fracture of vertebra, sacral and sacrococcygeal region, initial encounter for fracture: Secondary | ICD-10-CM | POA: Diagnosis not present

## 2017-08-16 DIAGNOSIS — M7062 Trochanteric bursitis, left hip: Secondary | ICD-10-CM | POA: Diagnosis not present

## 2017-08-17 DIAGNOSIS — M47816 Spondylosis without myelopathy or radiculopathy, lumbar region: Secondary | ICD-10-CM | POA: Diagnosis not present

## 2017-08-17 DIAGNOSIS — I10 Essential (primary) hypertension: Secondary | ICD-10-CM | POA: Diagnosis not present

## 2017-08-17 DIAGNOSIS — M1991 Primary osteoarthritis, unspecified site: Secondary | ICD-10-CM | POA: Diagnosis not present

## 2017-08-17 DIAGNOSIS — M5432 Sciatica, left side: Secondary | ICD-10-CM | POA: Diagnosis not present

## 2017-08-17 DIAGNOSIS — Z981 Arthrodesis status: Secondary | ICD-10-CM | POA: Diagnosis not present

## 2017-08-17 DIAGNOSIS — M5431 Sciatica, right side: Secondary | ICD-10-CM | POA: Diagnosis not present

## 2017-08-19 DIAGNOSIS — M47816 Spondylosis without myelopathy or radiculopathy, lumbar region: Secondary | ICD-10-CM | POA: Diagnosis not present

## 2017-08-19 DIAGNOSIS — M5431 Sciatica, right side: Secondary | ICD-10-CM | POA: Diagnosis not present

## 2017-08-19 DIAGNOSIS — M1991 Primary osteoarthritis, unspecified site: Secondary | ICD-10-CM | POA: Diagnosis not present

## 2017-08-19 DIAGNOSIS — Z981 Arthrodesis status: Secondary | ICD-10-CM | POA: Diagnosis not present

## 2017-08-19 DIAGNOSIS — M5432 Sciatica, left side: Secondary | ICD-10-CM | POA: Diagnosis not present

## 2017-08-19 DIAGNOSIS — I10 Essential (primary) hypertension: Secondary | ICD-10-CM | POA: Diagnosis not present

## 2017-08-22 DIAGNOSIS — M1991 Primary osteoarthritis, unspecified site: Secondary | ICD-10-CM | POA: Diagnosis not present

## 2017-08-22 DIAGNOSIS — Z981 Arthrodesis status: Secondary | ICD-10-CM | POA: Diagnosis not present

## 2017-08-22 DIAGNOSIS — M5431 Sciatica, right side: Secondary | ICD-10-CM | POA: Diagnosis not present

## 2017-08-22 DIAGNOSIS — I10 Essential (primary) hypertension: Secondary | ICD-10-CM | POA: Diagnosis not present

## 2017-08-22 DIAGNOSIS — M5432 Sciatica, left side: Secondary | ICD-10-CM | POA: Diagnosis not present

## 2017-08-22 DIAGNOSIS — M47816 Spondylosis without myelopathy or radiculopathy, lumbar region: Secondary | ICD-10-CM | POA: Diagnosis not present

## 2017-08-24 DIAGNOSIS — M1991 Primary osteoarthritis, unspecified site: Secondary | ICD-10-CM | POA: Diagnosis not present

## 2017-08-24 DIAGNOSIS — M5431 Sciatica, right side: Secondary | ICD-10-CM | POA: Diagnosis not present

## 2017-08-24 DIAGNOSIS — I10 Essential (primary) hypertension: Secondary | ICD-10-CM | POA: Diagnosis not present

## 2017-08-24 DIAGNOSIS — Z981 Arthrodesis status: Secondary | ICD-10-CM | POA: Diagnosis not present

## 2017-08-24 DIAGNOSIS — M47816 Spondylosis without myelopathy or radiculopathy, lumbar region: Secondary | ICD-10-CM | POA: Diagnosis not present

## 2017-08-24 DIAGNOSIS — M5432 Sciatica, left side: Secondary | ICD-10-CM | POA: Diagnosis not present

## 2017-08-26 DIAGNOSIS — M5431 Sciatica, right side: Secondary | ICD-10-CM | POA: Diagnosis not present

## 2017-08-26 DIAGNOSIS — M1991 Primary osteoarthritis, unspecified site: Secondary | ICD-10-CM | POA: Diagnosis not present

## 2017-08-26 DIAGNOSIS — Z981 Arthrodesis status: Secondary | ICD-10-CM | POA: Diagnosis not present

## 2017-08-26 DIAGNOSIS — I10 Essential (primary) hypertension: Secondary | ICD-10-CM | POA: Diagnosis not present

## 2017-08-26 DIAGNOSIS — M5432 Sciatica, left side: Secondary | ICD-10-CM | POA: Diagnosis not present

## 2017-08-26 DIAGNOSIS — M47816 Spondylosis without myelopathy or radiculopathy, lumbar region: Secondary | ICD-10-CM | POA: Diagnosis not present

## 2017-08-30 DIAGNOSIS — M545 Low back pain: Secondary | ICD-10-CM | POA: Diagnosis not present

## 2017-08-30 DIAGNOSIS — M5431 Sciatica, right side: Secondary | ICD-10-CM | POA: Diagnosis not present

## 2017-08-30 DIAGNOSIS — M5136 Other intervertebral disc degeneration, lumbar region: Secondary | ICD-10-CM | POA: Diagnosis not present

## 2017-08-30 DIAGNOSIS — M47816 Spondylosis without myelopathy or radiculopathy, lumbar region: Secondary | ICD-10-CM | POA: Diagnosis not present

## 2017-08-30 DIAGNOSIS — Z981 Arthrodesis status: Secondary | ICD-10-CM | POA: Diagnosis not present

## 2017-08-30 DIAGNOSIS — M5432 Sciatica, left side: Secondary | ICD-10-CM | POA: Diagnosis not present

## 2017-08-30 DIAGNOSIS — I10 Essential (primary) hypertension: Secondary | ICD-10-CM | POA: Diagnosis not present

## 2017-08-30 DIAGNOSIS — M4726 Other spondylosis with radiculopathy, lumbar region: Secondary | ICD-10-CM | POA: Diagnosis not present

## 2017-08-30 DIAGNOSIS — M1991 Primary osteoarthritis, unspecified site: Secondary | ICD-10-CM | POA: Diagnosis not present

## 2017-08-30 DIAGNOSIS — M810 Age-related osteoporosis without current pathological fracture: Secondary | ICD-10-CM | POA: Diagnosis not present

## 2017-08-31 DIAGNOSIS — M1991 Primary osteoarthritis, unspecified site: Secondary | ICD-10-CM | POA: Diagnosis not present

## 2017-08-31 DIAGNOSIS — I10 Essential (primary) hypertension: Secondary | ICD-10-CM | POA: Diagnosis not present

## 2017-08-31 DIAGNOSIS — Z981 Arthrodesis status: Secondary | ICD-10-CM | POA: Diagnosis not present

## 2017-08-31 DIAGNOSIS — M5432 Sciatica, left side: Secondary | ICD-10-CM | POA: Diagnosis not present

## 2017-08-31 DIAGNOSIS — M47816 Spondylosis without myelopathy or radiculopathy, lumbar region: Secondary | ICD-10-CM | POA: Diagnosis not present

## 2017-08-31 DIAGNOSIS — M5431 Sciatica, right side: Secondary | ICD-10-CM | POA: Diagnosis not present

## 2017-09-02 DIAGNOSIS — Z981 Arthrodesis status: Secondary | ICD-10-CM | POA: Diagnosis not present

## 2017-09-02 DIAGNOSIS — M1991 Primary osteoarthritis, unspecified site: Secondary | ICD-10-CM | POA: Diagnosis not present

## 2017-09-02 DIAGNOSIS — M5432 Sciatica, left side: Secondary | ICD-10-CM | POA: Diagnosis not present

## 2017-09-02 DIAGNOSIS — M47816 Spondylosis without myelopathy or radiculopathy, lumbar region: Secondary | ICD-10-CM | POA: Diagnosis not present

## 2017-09-02 DIAGNOSIS — M5431 Sciatica, right side: Secondary | ICD-10-CM | POA: Diagnosis not present

## 2017-09-02 DIAGNOSIS — I10 Essential (primary) hypertension: Secondary | ICD-10-CM | POA: Diagnosis not present

## 2017-09-05 DIAGNOSIS — I10 Essential (primary) hypertension: Secondary | ICD-10-CM | POA: Diagnosis not present

## 2017-09-05 DIAGNOSIS — M1991 Primary osteoarthritis, unspecified site: Secondary | ICD-10-CM | POA: Diagnosis not present

## 2017-09-05 DIAGNOSIS — M5431 Sciatica, right side: Secondary | ICD-10-CM | POA: Diagnosis not present

## 2017-09-05 DIAGNOSIS — M47816 Spondylosis without myelopathy or radiculopathy, lumbar region: Secondary | ICD-10-CM | POA: Diagnosis not present

## 2017-09-05 DIAGNOSIS — M5432 Sciatica, left side: Secondary | ICD-10-CM | POA: Diagnosis not present

## 2017-09-05 DIAGNOSIS — Z981 Arthrodesis status: Secondary | ICD-10-CM | POA: Diagnosis not present

## 2017-09-08 DIAGNOSIS — M5432 Sciatica, left side: Secondary | ICD-10-CM | POA: Diagnosis not present

## 2017-09-08 DIAGNOSIS — I10 Essential (primary) hypertension: Secondary | ICD-10-CM | POA: Diagnosis not present

## 2017-09-08 DIAGNOSIS — M1991 Primary osteoarthritis, unspecified site: Secondary | ICD-10-CM | POA: Diagnosis not present

## 2017-09-08 DIAGNOSIS — M5431 Sciatica, right side: Secondary | ICD-10-CM | POA: Diagnosis not present

## 2017-09-08 DIAGNOSIS — Z981 Arthrodesis status: Secondary | ICD-10-CM | POA: Diagnosis not present

## 2017-09-08 DIAGNOSIS — M47816 Spondylosis without myelopathy or radiculopathy, lumbar region: Secondary | ICD-10-CM | POA: Diagnosis not present

## 2017-09-12 DIAGNOSIS — M1991 Primary osteoarthritis, unspecified site: Secondary | ICD-10-CM | POA: Diagnosis not present

## 2017-09-12 DIAGNOSIS — Z981 Arthrodesis status: Secondary | ICD-10-CM | POA: Diagnosis not present

## 2017-09-12 DIAGNOSIS — M5432 Sciatica, left side: Secondary | ICD-10-CM | POA: Diagnosis not present

## 2017-09-12 DIAGNOSIS — M5431 Sciatica, right side: Secondary | ICD-10-CM | POA: Diagnosis not present

## 2017-09-12 DIAGNOSIS — I10 Essential (primary) hypertension: Secondary | ICD-10-CM | POA: Diagnosis not present

## 2017-09-12 DIAGNOSIS — M47816 Spondylosis without myelopathy or radiculopathy, lumbar region: Secondary | ICD-10-CM | POA: Diagnosis not present

## 2017-09-15 DIAGNOSIS — M5431 Sciatica, right side: Secondary | ICD-10-CM | POA: Diagnosis not present

## 2017-09-15 DIAGNOSIS — M47816 Spondylosis without myelopathy or radiculopathy, lumbar region: Secondary | ICD-10-CM | POA: Diagnosis not present

## 2017-09-15 DIAGNOSIS — M1991 Primary osteoarthritis, unspecified site: Secondary | ICD-10-CM | POA: Diagnosis not present

## 2017-09-15 DIAGNOSIS — Z981 Arthrodesis status: Secondary | ICD-10-CM | POA: Diagnosis not present

## 2017-09-15 DIAGNOSIS — M5432 Sciatica, left side: Secondary | ICD-10-CM | POA: Diagnosis not present

## 2017-09-15 DIAGNOSIS — I10 Essential (primary) hypertension: Secondary | ICD-10-CM | POA: Diagnosis not present

## 2017-09-20 DIAGNOSIS — M47816 Spondylosis without myelopathy or radiculopathy, lumbar region: Secondary | ICD-10-CM | POA: Diagnosis not present

## 2017-09-20 DIAGNOSIS — M5432 Sciatica, left side: Secondary | ICD-10-CM | POA: Diagnosis not present

## 2017-09-20 DIAGNOSIS — I10 Essential (primary) hypertension: Secondary | ICD-10-CM | POA: Diagnosis not present

## 2017-09-20 DIAGNOSIS — Z981 Arthrodesis status: Secondary | ICD-10-CM | POA: Diagnosis not present

## 2017-09-20 DIAGNOSIS — M1991 Primary osteoarthritis, unspecified site: Secondary | ICD-10-CM | POA: Diagnosis not present

## 2017-09-20 DIAGNOSIS — M5431 Sciatica, right side: Secondary | ICD-10-CM | POA: Diagnosis not present

## 2017-09-22 DIAGNOSIS — M1991 Primary osteoarthritis, unspecified site: Secondary | ICD-10-CM | POA: Diagnosis not present

## 2017-09-22 DIAGNOSIS — I10 Essential (primary) hypertension: Secondary | ICD-10-CM | POA: Diagnosis not present

## 2017-09-22 DIAGNOSIS — M5431 Sciatica, right side: Secondary | ICD-10-CM | POA: Diagnosis not present

## 2017-09-22 DIAGNOSIS — M47816 Spondylosis without myelopathy or radiculopathy, lumbar region: Secondary | ICD-10-CM | POA: Diagnosis not present

## 2017-09-22 DIAGNOSIS — Z981 Arthrodesis status: Secondary | ICD-10-CM | POA: Diagnosis not present

## 2017-09-22 DIAGNOSIS — M5432 Sciatica, left side: Secondary | ICD-10-CM | POA: Diagnosis not present

## 2017-09-27 DIAGNOSIS — M545 Low back pain: Secondary | ICD-10-CM | POA: Diagnosis not present

## 2017-09-27 DIAGNOSIS — Z6822 Body mass index (BMI) 22.0-22.9, adult: Secondary | ICD-10-CM | POA: Diagnosis not present

## 2017-09-28 DIAGNOSIS — M1991 Primary osteoarthritis, unspecified site: Secondary | ICD-10-CM | POA: Diagnosis not present

## 2017-09-28 DIAGNOSIS — Z79899 Other long term (current) drug therapy: Secondary | ICD-10-CM | POA: Diagnosis not present

## 2017-09-28 DIAGNOSIS — M47816 Spondylosis without myelopathy or radiculopathy, lumbar region: Secondary | ICD-10-CM | POA: Diagnosis not present

## 2017-09-28 DIAGNOSIS — Z981 Arthrodesis status: Secondary | ICD-10-CM | POA: Diagnosis not present

## 2017-09-28 DIAGNOSIS — M5431 Sciatica, right side: Secondary | ICD-10-CM | POA: Diagnosis not present

## 2017-09-28 DIAGNOSIS — E039 Hypothyroidism, unspecified: Secondary | ICD-10-CM | POA: Diagnosis not present

## 2017-09-28 DIAGNOSIS — I10 Essential (primary) hypertension: Secondary | ICD-10-CM | POA: Diagnosis not present

## 2017-09-28 DIAGNOSIS — E559 Vitamin D deficiency, unspecified: Secondary | ICD-10-CM | POA: Diagnosis not present

## 2017-09-28 DIAGNOSIS — M5432 Sciatica, left side: Secondary | ICD-10-CM | POA: Diagnosis not present

## 2017-09-30 DIAGNOSIS — M47816 Spondylosis without myelopathy or radiculopathy, lumbar region: Secondary | ICD-10-CM | POA: Diagnosis not present

## 2017-09-30 DIAGNOSIS — M5431 Sciatica, right side: Secondary | ICD-10-CM | POA: Diagnosis not present

## 2017-09-30 DIAGNOSIS — I10 Essential (primary) hypertension: Secondary | ICD-10-CM | POA: Diagnosis not present

## 2017-09-30 DIAGNOSIS — M5432 Sciatica, left side: Secondary | ICD-10-CM | POA: Diagnosis not present

## 2017-09-30 DIAGNOSIS — M1991 Primary osteoarthritis, unspecified site: Secondary | ICD-10-CM | POA: Diagnosis not present

## 2017-09-30 DIAGNOSIS — Z981 Arthrodesis status: Secondary | ICD-10-CM | POA: Diagnosis not present

## 2017-10-04 DIAGNOSIS — M5432 Sciatica, left side: Secondary | ICD-10-CM | POA: Diagnosis not present

## 2017-10-04 DIAGNOSIS — M47816 Spondylosis without myelopathy or radiculopathy, lumbar region: Secondary | ICD-10-CM | POA: Diagnosis not present

## 2017-10-04 DIAGNOSIS — I10 Essential (primary) hypertension: Secondary | ICD-10-CM | POA: Diagnosis not present

## 2017-10-04 DIAGNOSIS — Z981 Arthrodesis status: Secondary | ICD-10-CM | POA: Diagnosis not present

## 2017-10-04 DIAGNOSIS — M1991 Primary osteoarthritis, unspecified site: Secondary | ICD-10-CM | POA: Diagnosis not present

## 2017-10-04 DIAGNOSIS — M5431 Sciatica, right side: Secondary | ICD-10-CM | POA: Diagnosis not present

## 2017-10-05 DIAGNOSIS — M7989 Other specified soft tissue disorders: Secondary | ICD-10-CM | POA: Diagnosis not present

## 2017-10-05 DIAGNOSIS — S86912A Strain of unspecified muscle(s) and tendon(s) at lower leg level, left leg, initial encounter: Secondary | ICD-10-CM | POA: Diagnosis not present

## 2017-10-06 DIAGNOSIS — M5432 Sciatica, left side: Secondary | ICD-10-CM | POA: Diagnosis not present

## 2017-10-06 DIAGNOSIS — M5431 Sciatica, right side: Secondary | ICD-10-CM | POA: Diagnosis not present

## 2017-10-06 DIAGNOSIS — M1991 Primary osteoarthritis, unspecified site: Secondary | ICD-10-CM | POA: Diagnosis not present

## 2017-10-06 DIAGNOSIS — M47816 Spondylosis without myelopathy or radiculopathy, lumbar region: Secondary | ICD-10-CM | POA: Diagnosis not present

## 2017-10-06 DIAGNOSIS — Z981 Arthrodesis status: Secondary | ICD-10-CM | POA: Diagnosis not present

## 2017-10-06 DIAGNOSIS — I10 Essential (primary) hypertension: Secondary | ICD-10-CM | POA: Diagnosis not present

## 2017-10-11 DIAGNOSIS — I10 Essential (primary) hypertension: Secondary | ICD-10-CM | POA: Diagnosis not present

## 2017-10-11 DIAGNOSIS — M1991 Primary osteoarthritis, unspecified site: Secondary | ICD-10-CM | POA: Diagnosis not present

## 2017-10-11 DIAGNOSIS — Z981 Arthrodesis status: Secondary | ICD-10-CM | POA: Diagnosis not present

## 2017-10-11 DIAGNOSIS — M47816 Spondylosis without myelopathy or radiculopathy, lumbar region: Secondary | ICD-10-CM | POA: Diagnosis not present

## 2017-10-11 DIAGNOSIS — M5432 Sciatica, left side: Secondary | ICD-10-CM | POA: Diagnosis not present

## 2017-10-11 DIAGNOSIS — M5431 Sciatica, right side: Secondary | ICD-10-CM | POA: Diagnosis not present

## 2017-10-13 DIAGNOSIS — M47816 Spondylosis without myelopathy or radiculopathy, lumbar region: Secondary | ICD-10-CM | POA: Diagnosis not present

## 2017-10-13 DIAGNOSIS — M5431 Sciatica, right side: Secondary | ICD-10-CM | POA: Diagnosis not present

## 2017-10-13 DIAGNOSIS — M1991 Primary osteoarthritis, unspecified site: Secondary | ICD-10-CM | POA: Diagnosis not present

## 2017-10-13 DIAGNOSIS — M5432 Sciatica, left side: Secondary | ICD-10-CM | POA: Diagnosis not present

## 2017-10-13 DIAGNOSIS — Z981 Arthrodesis status: Secondary | ICD-10-CM | POA: Diagnosis not present

## 2017-10-13 DIAGNOSIS — I10 Essential (primary) hypertension: Secondary | ICD-10-CM | POA: Diagnosis not present

## 2017-10-18 DIAGNOSIS — Z981 Arthrodesis status: Secondary | ICD-10-CM | POA: Diagnosis not present

## 2017-10-18 DIAGNOSIS — M1991 Primary osteoarthritis, unspecified site: Secondary | ICD-10-CM | POA: Diagnosis not present

## 2017-10-18 DIAGNOSIS — M47816 Spondylosis without myelopathy or radiculopathy, lumbar region: Secondary | ICD-10-CM | POA: Diagnosis not present

## 2017-10-18 DIAGNOSIS — I10 Essential (primary) hypertension: Secondary | ICD-10-CM | POA: Diagnosis not present

## 2017-10-18 DIAGNOSIS — M5431 Sciatica, right side: Secondary | ICD-10-CM | POA: Diagnosis not present

## 2017-10-18 DIAGNOSIS — M5432 Sciatica, left side: Secondary | ICD-10-CM | POA: Diagnosis not present

## 2017-10-20 DIAGNOSIS — M5432 Sciatica, left side: Secondary | ICD-10-CM | POA: Diagnosis not present

## 2017-10-20 DIAGNOSIS — I10 Essential (primary) hypertension: Secondary | ICD-10-CM | POA: Diagnosis not present

## 2017-10-20 DIAGNOSIS — M5431 Sciatica, right side: Secondary | ICD-10-CM | POA: Diagnosis not present

## 2017-10-20 DIAGNOSIS — Z981 Arthrodesis status: Secondary | ICD-10-CM | POA: Diagnosis not present

## 2017-10-20 DIAGNOSIS — M47816 Spondylosis without myelopathy or radiculopathy, lumbar region: Secondary | ICD-10-CM | POA: Diagnosis not present

## 2017-10-20 DIAGNOSIS — M1991 Primary osteoarthritis, unspecified site: Secondary | ICD-10-CM | POA: Diagnosis not present

## 2017-10-25 DIAGNOSIS — M47816 Spondylosis without myelopathy or radiculopathy, lumbar region: Secondary | ICD-10-CM | POA: Diagnosis not present

## 2017-10-25 DIAGNOSIS — M1991 Primary osteoarthritis, unspecified site: Secondary | ICD-10-CM | POA: Diagnosis not present

## 2017-10-25 DIAGNOSIS — Z981 Arthrodesis status: Secondary | ICD-10-CM | POA: Diagnosis not present

## 2017-10-25 DIAGNOSIS — M5431 Sciatica, right side: Secondary | ICD-10-CM | POA: Diagnosis not present

## 2017-10-25 DIAGNOSIS — M5432 Sciatica, left side: Secondary | ICD-10-CM | POA: Diagnosis not present

## 2017-10-25 DIAGNOSIS — I10 Essential (primary) hypertension: Secondary | ICD-10-CM | POA: Diagnosis not present

## 2017-10-27 DIAGNOSIS — M47816 Spondylosis without myelopathy or radiculopathy, lumbar region: Secondary | ICD-10-CM | POA: Diagnosis not present

## 2017-10-27 DIAGNOSIS — Z981 Arthrodesis status: Secondary | ICD-10-CM | POA: Diagnosis not present

## 2017-10-27 DIAGNOSIS — M1991 Primary osteoarthritis, unspecified site: Secondary | ICD-10-CM | POA: Diagnosis not present

## 2017-10-27 DIAGNOSIS — I10 Essential (primary) hypertension: Secondary | ICD-10-CM | POA: Diagnosis not present

## 2017-10-27 DIAGNOSIS — M5432 Sciatica, left side: Secondary | ICD-10-CM | POA: Diagnosis not present

## 2017-10-27 DIAGNOSIS — M5431 Sciatica, right side: Secondary | ICD-10-CM | POA: Diagnosis not present

## 2017-10-29 DIAGNOSIS — M5431 Sciatica, right side: Secondary | ICD-10-CM | POA: Diagnosis not present

## 2017-10-29 DIAGNOSIS — M47816 Spondylosis without myelopathy or radiculopathy, lumbar region: Secondary | ICD-10-CM | POA: Diagnosis not present

## 2017-10-29 DIAGNOSIS — M1991 Primary osteoarthritis, unspecified site: Secondary | ICD-10-CM | POA: Diagnosis not present

## 2017-10-29 DIAGNOSIS — I10 Essential (primary) hypertension: Secondary | ICD-10-CM | POA: Diagnosis not present

## 2017-10-29 DIAGNOSIS — M5432 Sciatica, left side: Secondary | ICD-10-CM | POA: Diagnosis not present

## 2017-10-29 DIAGNOSIS — Z981 Arthrodesis status: Secondary | ICD-10-CM | POA: Diagnosis not present

## 2017-11-01 DIAGNOSIS — M5431 Sciatica, right side: Secondary | ICD-10-CM | POA: Diagnosis not present

## 2017-11-01 DIAGNOSIS — M47816 Spondylosis without myelopathy or radiculopathy, lumbar region: Secondary | ICD-10-CM | POA: Diagnosis not present

## 2017-11-01 DIAGNOSIS — M5432 Sciatica, left side: Secondary | ICD-10-CM | POA: Diagnosis not present

## 2017-11-01 DIAGNOSIS — I10 Essential (primary) hypertension: Secondary | ICD-10-CM | POA: Diagnosis not present

## 2017-11-01 DIAGNOSIS — M1991 Primary osteoarthritis, unspecified site: Secondary | ICD-10-CM | POA: Diagnosis not present

## 2017-11-01 DIAGNOSIS — Z981 Arthrodesis status: Secondary | ICD-10-CM | POA: Diagnosis not present

## 2017-11-03 DIAGNOSIS — M47816 Spondylosis without myelopathy or radiculopathy, lumbar region: Secondary | ICD-10-CM | POA: Diagnosis not present

## 2017-11-03 DIAGNOSIS — M5431 Sciatica, right side: Secondary | ICD-10-CM | POA: Diagnosis not present

## 2017-11-03 DIAGNOSIS — I10 Essential (primary) hypertension: Secondary | ICD-10-CM | POA: Diagnosis not present

## 2017-11-03 DIAGNOSIS — M5432 Sciatica, left side: Secondary | ICD-10-CM | POA: Diagnosis not present

## 2017-11-03 DIAGNOSIS — Z981 Arthrodesis status: Secondary | ICD-10-CM | POA: Diagnosis not present

## 2017-11-03 DIAGNOSIS — M1991 Primary osteoarthritis, unspecified site: Secondary | ICD-10-CM | POA: Diagnosis not present

## 2017-11-08 DIAGNOSIS — M1991 Primary osteoarthritis, unspecified site: Secondary | ICD-10-CM | POA: Diagnosis not present

## 2017-11-08 DIAGNOSIS — M5431 Sciatica, right side: Secondary | ICD-10-CM | POA: Diagnosis not present

## 2017-11-08 DIAGNOSIS — I10 Essential (primary) hypertension: Secondary | ICD-10-CM | POA: Diagnosis not present

## 2017-11-08 DIAGNOSIS — M5432 Sciatica, left side: Secondary | ICD-10-CM | POA: Diagnosis not present

## 2017-11-08 DIAGNOSIS — Z981 Arthrodesis status: Secondary | ICD-10-CM | POA: Diagnosis not present

## 2017-11-08 DIAGNOSIS — M47816 Spondylosis without myelopathy or radiculopathy, lumbar region: Secondary | ICD-10-CM | POA: Diagnosis not present

## 2017-11-10 DIAGNOSIS — M47816 Spondylosis without myelopathy or radiculopathy, lumbar region: Secondary | ICD-10-CM | POA: Diagnosis not present

## 2017-11-10 DIAGNOSIS — I10 Essential (primary) hypertension: Secondary | ICD-10-CM | POA: Diagnosis not present

## 2017-11-10 DIAGNOSIS — M5432 Sciatica, left side: Secondary | ICD-10-CM | POA: Diagnosis not present

## 2017-11-10 DIAGNOSIS — Z981 Arthrodesis status: Secondary | ICD-10-CM | POA: Diagnosis not present

## 2017-11-10 DIAGNOSIS — M5431 Sciatica, right side: Secondary | ICD-10-CM | POA: Diagnosis not present

## 2017-11-10 DIAGNOSIS — M1991 Primary osteoarthritis, unspecified site: Secondary | ICD-10-CM | POA: Diagnosis not present

## 2017-11-14 DIAGNOSIS — Z981 Arthrodesis status: Secondary | ICD-10-CM | POA: Diagnosis not present

## 2017-11-14 DIAGNOSIS — I10 Essential (primary) hypertension: Secondary | ICD-10-CM | POA: Diagnosis not present

## 2017-11-14 DIAGNOSIS — M5432 Sciatica, left side: Secondary | ICD-10-CM | POA: Diagnosis not present

## 2017-11-14 DIAGNOSIS — M1991 Primary osteoarthritis, unspecified site: Secondary | ICD-10-CM | POA: Diagnosis not present

## 2017-11-14 DIAGNOSIS — M47816 Spondylosis without myelopathy or radiculopathy, lumbar region: Secondary | ICD-10-CM | POA: Diagnosis not present

## 2017-11-14 DIAGNOSIS — M5431 Sciatica, right side: Secondary | ICD-10-CM | POA: Diagnosis not present

## 2017-11-15 DIAGNOSIS — M545 Low back pain: Secondary | ICD-10-CM | POA: Diagnosis not present

## 2017-11-17 DIAGNOSIS — M5432 Sciatica, left side: Secondary | ICD-10-CM | POA: Diagnosis not present

## 2017-11-17 DIAGNOSIS — M1991 Primary osteoarthritis, unspecified site: Secondary | ICD-10-CM | POA: Diagnosis not present

## 2017-11-17 DIAGNOSIS — I10 Essential (primary) hypertension: Secondary | ICD-10-CM | POA: Diagnosis not present

## 2017-11-17 DIAGNOSIS — M47816 Spondylosis without myelopathy or radiculopathy, lumbar region: Secondary | ICD-10-CM | POA: Diagnosis not present

## 2017-11-17 DIAGNOSIS — Z981 Arthrodesis status: Secondary | ICD-10-CM | POA: Diagnosis not present

## 2017-11-17 DIAGNOSIS — M5431 Sciatica, right side: Secondary | ICD-10-CM | POA: Diagnosis not present

## 2017-11-22 DIAGNOSIS — M5431 Sciatica, right side: Secondary | ICD-10-CM | POA: Diagnosis not present

## 2017-11-22 DIAGNOSIS — M47816 Spondylosis without myelopathy or radiculopathy, lumbar region: Secondary | ICD-10-CM | POA: Diagnosis not present

## 2017-11-22 DIAGNOSIS — I10 Essential (primary) hypertension: Secondary | ICD-10-CM | POA: Diagnosis not present

## 2017-11-22 DIAGNOSIS — Z981 Arthrodesis status: Secondary | ICD-10-CM | POA: Diagnosis not present

## 2017-11-22 DIAGNOSIS — M1991 Primary osteoarthritis, unspecified site: Secondary | ICD-10-CM | POA: Diagnosis not present

## 2017-11-22 DIAGNOSIS — M5432 Sciatica, left side: Secondary | ICD-10-CM | POA: Diagnosis not present

## 2017-11-23 DIAGNOSIS — Z1339 Encounter for screening examination for other mental health and behavioral disorders: Secondary | ICD-10-CM | POA: Diagnosis not present

## 2017-11-23 DIAGNOSIS — Z0000 Encounter for general adult medical examination without abnormal findings: Secondary | ICD-10-CM | POA: Diagnosis not present

## 2017-11-23 DIAGNOSIS — Z681 Body mass index (BMI) 19 or less, adult: Secondary | ICD-10-CM | POA: Diagnosis not present

## 2017-11-23 DIAGNOSIS — E039 Hypothyroidism, unspecified: Secondary | ICD-10-CM | POA: Diagnosis not present

## 2017-11-23 DIAGNOSIS — R600 Localized edema: Secondary | ICD-10-CM | POA: Diagnosis not present

## 2017-11-24 DIAGNOSIS — I10 Essential (primary) hypertension: Secondary | ICD-10-CM | POA: Diagnosis not present

## 2017-11-24 DIAGNOSIS — M47816 Spondylosis without myelopathy or radiculopathy, lumbar region: Secondary | ICD-10-CM | POA: Diagnosis not present

## 2017-11-24 DIAGNOSIS — M5431 Sciatica, right side: Secondary | ICD-10-CM | POA: Diagnosis not present

## 2017-11-24 DIAGNOSIS — M1991 Primary osteoarthritis, unspecified site: Secondary | ICD-10-CM | POA: Diagnosis not present

## 2017-11-24 DIAGNOSIS — M5432 Sciatica, left side: Secondary | ICD-10-CM | POA: Diagnosis not present

## 2017-11-24 DIAGNOSIS — Z981 Arthrodesis status: Secondary | ICD-10-CM | POA: Diagnosis not present

## 2017-11-29 DIAGNOSIS — M5432 Sciatica, left side: Secondary | ICD-10-CM | POA: Diagnosis not present

## 2017-11-29 DIAGNOSIS — Z981 Arthrodesis status: Secondary | ICD-10-CM | POA: Diagnosis not present

## 2017-11-29 DIAGNOSIS — M1991 Primary osteoarthritis, unspecified site: Secondary | ICD-10-CM | POA: Diagnosis not present

## 2017-11-29 DIAGNOSIS — M47816 Spondylosis without myelopathy or radiculopathy, lumbar region: Secondary | ICD-10-CM | POA: Diagnosis not present

## 2017-11-29 DIAGNOSIS — I10 Essential (primary) hypertension: Secondary | ICD-10-CM | POA: Diagnosis not present

## 2017-11-29 DIAGNOSIS — M5431 Sciatica, right side: Secondary | ICD-10-CM | POA: Diagnosis not present

## 2017-12-01 DIAGNOSIS — M47816 Spondylosis without myelopathy or radiculopathy, lumbar region: Secondary | ICD-10-CM | POA: Diagnosis not present

## 2017-12-01 DIAGNOSIS — I10 Essential (primary) hypertension: Secondary | ICD-10-CM | POA: Diagnosis not present

## 2017-12-01 DIAGNOSIS — M5431 Sciatica, right side: Secondary | ICD-10-CM | POA: Diagnosis not present

## 2017-12-01 DIAGNOSIS — M5432 Sciatica, left side: Secondary | ICD-10-CM | POA: Diagnosis not present

## 2017-12-01 DIAGNOSIS — Z981 Arthrodesis status: Secondary | ICD-10-CM | POA: Diagnosis not present

## 2017-12-01 DIAGNOSIS — M1991 Primary osteoarthritis, unspecified site: Secondary | ICD-10-CM | POA: Diagnosis not present

## 2017-12-06 DIAGNOSIS — M47816 Spondylosis without myelopathy or radiculopathy, lumbar region: Secondary | ICD-10-CM | POA: Diagnosis not present

## 2017-12-06 DIAGNOSIS — M5432 Sciatica, left side: Secondary | ICD-10-CM | POA: Diagnosis not present

## 2017-12-06 DIAGNOSIS — M1991 Primary osteoarthritis, unspecified site: Secondary | ICD-10-CM | POA: Diagnosis not present

## 2017-12-06 DIAGNOSIS — M5431 Sciatica, right side: Secondary | ICD-10-CM | POA: Diagnosis not present

## 2017-12-06 DIAGNOSIS — Z981 Arthrodesis status: Secondary | ICD-10-CM | POA: Diagnosis not present

## 2017-12-06 DIAGNOSIS — I10 Essential (primary) hypertension: Secondary | ICD-10-CM | POA: Diagnosis not present

## 2017-12-08 DIAGNOSIS — I10 Essential (primary) hypertension: Secondary | ICD-10-CM | POA: Diagnosis not present

## 2017-12-08 DIAGNOSIS — M5432 Sciatica, left side: Secondary | ICD-10-CM | POA: Diagnosis not present

## 2017-12-08 DIAGNOSIS — Z981 Arthrodesis status: Secondary | ICD-10-CM | POA: Diagnosis not present

## 2017-12-08 DIAGNOSIS — M5431 Sciatica, right side: Secondary | ICD-10-CM | POA: Diagnosis not present

## 2017-12-08 DIAGNOSIS — M1991 Primary osteoarthritis, unspecified site: Secondary | ICD-10-CM | POA: Diagnosis not present

## 2017-12-08 DIAGNOSIS — M47816 Spondylosis without myelopathy or radiculopathy, lumbar region: Secondary | ICD-10-CM | POA: Diagnosis not present

## 2017-12-09 DIAGNOSIS — R300 Dysuria: Secondary | ICD-10-CM | POA: Diagnosis not present

## 2017-12-16 DIAGNOSIS — M1991 Primary osteoarthritis, unspecified site: Secondary | ICD-10-CM | POA: Diagnosis not present

## 2017-12-16 DIAGNOSIS — Z981 Arthrodesis status: Secondary | ICD-10-CM | POA: Diagnosis not present

## 2017-12-16 DIAGNOSIS — M47816 Spondylosis without myelopathy or radiculopathy, lumbar region: Secondary | ICD-10-CM | POA: Diagnosis not present

## 2017-12-16 DIAGNOSIS — I10 Essential (primary) hypertension: Secondary | ICD-10-CM | POA: Diagnosis not present

## 2017-12-16 DIAGNOSIS — M5431 Sciatica, right side: Secondary | ICD-10-CM | POA: Diagnosis not present

## 2017-12-16 DIAGNOSIS — M5432 Sciatica, left side: Secondary | ICD-10-CM | POA: Diagnosis not present

## 2017-12-19 DIAGNOSIS — L97311 Non-pressure chronic ulcer of right ankle limited to breakdown of skin: Secondary | ICD-10-CM | POA: Diagnosis not present

## 2017-12-19 DIAGNOSIS — D485 Neoplasm of uncertain behavior of skin: Secondary | ICD-10-CM | POA: Diagnosis not present

## 2017-12-19 DIAGNOSIS — D0472 Carcinoma in situ of skin of left lower limb, including hip: Secondary | ICD-10-CM | POA: Diagnosis not present

## 2017-12-20 DIAGNOSIS — I10 Essential (primary) hypertension: Secondary | ICD-10-CM | POA: Diagnosis not present

## 2017-12-20 DIAGNOSIS — M5432 Sciatica, left side: Secondary | ICD-10-CM | POA: Diagnosis not present

## 2017-12-20 DIAGNOSIS — M1991 Primary osteoarthritis, unspecified site: Secondary | ICD-10-CM | POA: Diagnosis not present

## 2017-12-20 DIAGNOSIS — M47816 Spondylosis without myelopathy or radiculopathy, lumbar region: Secondary | ICD-10-CM | POA: Diagnosis not present

## 2017-12-20 DIAGNOSIS — M5431 Sciatica, right side: Secondary | ICD-10-CM | POA: Diagnosis not present

## 2017-12-20 DIAGNOSIS — Z981 Arthrodesis status: Secondary | ICD-10-CM | POA: Diagnosis not present

## 2017-12-26 DIAGNOSIS — M1991 Primary osteoarthritis, unspecified site: Secondary | ICD-10-CM | POA: Diagnosis not present

## 2017-12-26 DIAGNOSIS — M5432 Sciatica, left side: Secondary | ICD-10-CM | POA: Diagnosis not present

## 2017-12-26 DIAGNOSIS — M5431 Sciatica, right side: Secondary | ICD-10-CM | POA: Diagnosis not present

## 2017-12-26 DIAGNOSIS — M47816 Spondylosis without myelopathy or radiculopathy, lumbar region: Secondary | ICD-10-CM | POA: Diagnosis not present

## 2017-12-26 DIAGNOSIS — I10 Essential (primary) hypertension: Secondary | ICD-10-CM | POA: Diagnosis not present

## 2017-12-26 DIAGNOSIS — Z981 Arthrodesis status: Secondary | ICD-10-CM | POA: Diagnosis not present

## 2017-12-27 DIAGNOSIS — M47816 Spondylosis without myelopathy or radiculopathy, lumbar region: Secondary | ICD-10-CM | POA: Diagnosis not present

## 2017-12-27 DIAGNOSIS — I10 Essential (primary) hypertension: Secondary | ICD-10-CM | POA: Diagnosis not present

## 2017-12-27 DIAGNOSIS — M1991 Primary osteoarthritis, unspecified site: Secondary | ICD-10-CM | POA: Diagnosis not present

## 2017-12-27 DIAGNOSIS — M5432 Sciatica, left side: Secondary | ICD-10-CM | POA: Diagnosis not present

## 2017-12-27 DIAGNOSIS — Z981 Arthrodesis status: Secondary | ICD-10-CM | POA: Diagnosis not present

## 2017-12-27 DIAGNOSIS — M5431 Sciatica, right side: Secondary | ICD-10-CM | POA: Diagnosis not present

## 2017-12-28 DIAGNOSIS — M5432 Sciatica, left side: Secondary | ICD-10-CM | POA: Diagnosis not present

## 2017-12-28 DIAGNOSIS — M1991 Primary osteoarthritis, unspecified site: Secondary | ICD-10-CM | POA: Diagnosis not present

## 2017-12-28 DIAGNOSIS — M47816 Spondylosis without myelopathy or radiculopathy, lumbar region: Secondary | ICD-10-CM | POA: Diagnosis not present

## 2017-12-28 DIAGNOSIS — I10 Essential (primary) hypertension: Secondary | ICD-10-CM | POA: Diagnosis not present

## 2017-12-28 DIAGNOSIS — Z981 Arthrodesis status: Secondary | ICD-10-CM | POA: Diagnosis not present

## 2017-12-28 DIAGNOSIS — M5431 Sciatica, right side: Secondary | ICD-10-CM | POA: Diagnosis not present

## 2018-01-02 DIAGNOSIS — M1991 Primary osteoarthritis, unspecified site: Secondary | ICD-10-CM | POA: Diagnosis not present

## 2018-01-02 DIAGNOSIS — M5432 Sciatica, left side: Secondary | ICD-10-CM | POA: Diagnosis not present

## 2018-01-02 DIAGNOSIS — M47816 Spondylosis without myelopathy or radiculopathy, lumbar region: Secondary | ICD-10-CM | POA: Diagnosis not present

## 2018-01-02 DIAGNOSIS — I10 Essential (primary) hypertension: Secondary | ICD-10-CM | POA: Diagnosis not present

## 2018-01-02 DIAGNOSIS — Z981 Arthrodesis status: Secondary | ICD-10-CM | POA: Diagnosis not present

## 2018-01-02 DIAGNOSIS — M5431 Sciatica, right side: Secondary | ICD-10-CM | POA: Diagnosis not present

## 2018-01-03 DIAGNOSIS — M545 Low back pain: Secondary | ICD-10-CM | POA: Diagnosis not present

## 2018-01-03 DIAGNOSIS — M5136 Other intervertebral disc degeneration, lumbar region: Secondary | ICD-10-CM | POA: Diagnosis not present

## 2018-01-09 DIAGNOSIS — D485 Neoplasm of uncertain behavior of skin: Secondary | ICD-10-CM | POA: Diagnosis not present

## 2018-01-09 DIAGNOSIS — C44321 Squamous cell carcinoma of skin of nose: Secondary | ICD-10-CM | POA: Diagnosis not present

## 2018-01-09 DIAGNOSIS — D0472 Carcinoma in situ of skin of left lower limb, including hip: Secondary | ICD-10-CM | POA: Diagnosis not present

## 2018-01-10 DIAGNOSIS — Z981 Arthrodesis status: Secondary | ICD-10-CM | POA: Diagnosis not present

## 2018-01-10 DIAGNOSIS — M5431 Sciatica, right side: Secondary | ICD-10-CM | POA: Diagnosis not present

## 2018-01-10 DIAGNOSIS — M5432 Sciatica, left side: Secondary | ICD-10-CM | POA: Diagnosis not present

## 2018-01-10 DIAGNOSIS — M1991 Primary osteoarthritis, unspecified site: Secondary | ICD-10-CM | POA: Diagnosis not present

## 2018-01-10 DIAGNOSIS — I10 Essential (primary) hypertension: Secondary | ICD-10-CM | POA: Diagnosis not present

## 2018-01-10 DIAGNOSIS — M47816 Spondylosis without myelopathy or radiculopathy, lumbar region: Secondary | ICD-10-CM | POA: Diagnosis not present

## 2018-01-17 DIAGNOSIS — M47816 Spondylosis without myelopathy or radiculopathy, lumbar region: Secondary | ICD-10-CM | POA: Diagnosis not present

## 2018-01-17 DIAGNOSIS — I10 Essential (primary) hypertension: Secondary | ICD-10-CM | POA: Diagnosis not present

## 2018-01-17 DIAGNOSIS — M5432 Sciatica, left side: Secondary | ICD-10-CM | POA: Diagnosis not present

## 2018-01-17 DIAGNOSIS — M1991 Primary osteoarthritis, unspecified site: Secondary | ICD-10-CM | POA: Diagnosis not present

## 2018-01-17 DIAGNOSIS — Z981 Arthrodesis status: Secondary | ICD-10-CM | POA: Diagnosis not present

## 2018-01-17 DIAGNOSIS — M5431 Sciatica, right side: Secondary | ICD-10-CM | POA: Diagnosis not present

## 2018-01-22 DIAGNOSIS — M47816 Spondylosis without myelopathy or radiculopathy, lumbar region: Secondary | ICD-10-CM | POA: Diagnosis not present

## 2018-01-22 DIAGNOSIS — Z981 Arthrodesis status: Secondary | ICD-10-CM | POA: Diagnosis not present

## 2018-01-22 DIAGNOSIS — N390 Urinary tract infection, site not specified: Secondary | ICD-10-CM | POA: Diagnosis not present

## 2018-01-22 DIAGNOSIS — M5431 Sciatica, right side: Secondary | ICD-10-CM | POA: Diagnosis not present

## 2018-01-22 DIAGNOSIS — M5432 Sciatica, left side: Secondary | ICD-10-CM | POA: Diagnosis not present

## 2018-01-22 DIAGNOSIS — M1991 Primary osteoarthritis, unspecified site: Secondary | ICD-10-CM | POA: Diagnosis not present

## 2018-01-22 DIAGNOSIS — I10 Essential (primary) hypertension: Secondary | ICD-10-CM | POA: Diagnosis not present

## 2018-01-24 DIAGNOSIS — Z8673 Personal history of transient ischemic attack (TIA), and cerebral infarction without residual deficits: Secondary | ICD-10-CM | POA: Diagnosis not present

## 2018-01-24 DIAGNOSIS — R0902 Hypoxemia: Secondary | ICD-10-CM | POA: Diagnosis not present

## 2018-01-24 DIAGNOSIS — M5489 Other dorsalgia: Secondary | ICD-10-CM | POA: Diagnosis not present

## 2018-01-24 DIAGNOSIS — G8929 Other chronic pain: Secondary | ICD-10-CM | POA: Diagnosis not present

## 2018-01-24 DIAGNOSIS — R262 Difficulty in walking, not elsewhere classified: Secondary | ICD-10-CM

## 2018-01-24 DIAGNOSIS — Z981 Arthrodesis status: Secondary | ICD-10-CM | POA: Diagnosis not present

## 2018-01-24 DIAGNOSIS — M1991 Primary osteoarthritis, unspecified site: Secondary | ICD-10-CM | POA: Diagnosis not present

## 2018-01-24 DIAGNOSIS — R609 Edema, unspecified: Secondary | ICD-10-CM | POA: Diagnosis not present

## 2018-01-24 DIAGNOSIS — M5432 Sciatica, left side: Secondary | ICD-10-CM | POA: Diagnosis not present

## 2018-01-24 DIAGNOSIS — E871 Hypo-osmolality and hyponatremia: Secondary | ICD-10-CM

## 2018-01-24 DIAGNOSIS — M25572 Pain in left ankle and joints of left foot: Secondary | ICD-10-CM | POA: Diagnosis not present

## 2018-01-24 DIAGNOSIS — Z993 Dependence on wheelchair: Secondary | ICD-10-CM | POA: Diagnosis not present

## 2018-01-24 DIAGNOSIS — M47816 Spondylosis without myelopathy or radiculopathy, lumbar region: Secondary | ICD-10-CM | POA: Diagnosis not present

## 2018-01-24 DIAGNOSIS — M549 Dorsalgia, unspecified: Secondary | ICD-10-CM | POA: Diagnosis not present

## 2018-01-24 DIAGNOSIS — R52 Pain, unspecified: Secondary | ICD-10-CM | POA: Diagnosis not present

## 2018-01-24 DIAGNOSIS — R0601 Orthopnea: Secondary | ICD-10-CM | POA: Diagnosis not present

## 2018-01-24 DIAGNOSIS — S99912A Unspecified injury of left ankle, initial encounter: Secondary | ICD-10-CM | POA: Diagnosis not present

## 2018-01-24 DIAGNOSIS — Z79899 Other long term (current) drug therapy: Secondary | ICD-10-CM | POA: Diagnosis not present

## 2018-01-24 DIAGNOSIS — I10 Essential (primary) hypertension: Secondary | ICD-10-CM | POA: Diagnosis not present

## 2018-01-24 DIAGNOSIS — S92352A Displaced fracture of fifth metatarsal bone, left foot, initial encounter for closed fracture: Secondary | ICD-10-CM | POA: Diagnosis not present

## 2018-01-24 DIAGNOSIS — N179 Acute kidney failure, unspecified: Secondary | ICD-10-CM

## 2018-01-24 DIAGNOSIS — R531 Weakness: Secondary | ICD-10-CM | POA: Diagnosis not present

## 2018-01-24 DIAGNOSIS — R5381 Other malaise: Secondary | ICD-10-CM | POA: Diagnosis not present

## 2018-01-24 DIAGNOSIS — M1990 Unspecified osteoarthritis, unspecified site: Secondary | ICD-10-CM | POA: Diagnosis not present

## 2018-01-24 DIAGNOSIS — E039 Hypothyroidism, unspecified: Secondary | ICD-10-CM | POA: Diagnosis not present

## 2018-01-24 DIAGNOSIS — M5431 Sciatica, right side: Secondary | ICD-10-CM | POA: Diagnosis not present

## 2018-01-24 DIAGNOSIS — R634 Abnormal weight loss: Secondary | ICD-10-CM | POA: Diagnosis not present

## 2018-01-25 DIAGNOSIS — M545 Low back pain: Secondary | ICD-10-CM | POA: Diagnosis not present

## 2018-01-25 DIAGNOSIS — R0902 Hypoxemia: Secondary | ICD-10-CM | POA: Diagnosis not present

## 2018-01-25 DIAGNOSIS — I517 Cardiomegaly: Secondary | ICD-10-CM | POA: Diagnosis not present

## 2018-01-25 DIAGNOSIS — Z7401 Bed confinement status: Secondary | ICD-10-CM | POA: Diagnosis not present

## 2018-01-25 DIAGNOSIS — S92352A Displaced fracture of fifth metatarsal bone, left foot, initial encounter for closed fracture: Secondary | ICD-10-CM | POA: Diagnosis not present

## 2018-01-27 DIAGNOSIS — Z8679 Personal history of other diseases of the circulatory system: Secondary | ICD-10-CM | POA: Diagnosis not present

## 2018-01-27 DIAGNOSIS — I509 Heart failure, unspecified: Secondary | ICD-10-CM | POA: Diagnosis not present

## 2018-01-27 DIAGNOSIS — R5381 Other malaise: Secondary | ICD-10-CM | POA: Diagnosis not present

## 2018-01-27 DIAGNOSIS — Z789 Other specified health status: Secondary | ICD-10-CM | POA: Diagnosis not present

## 2018-01-27 DIAGNOSIS — Z8719 Personal history of other diseases of the digestive system: Secondary | ICD-10-CM | POA: Diagnosis not present

## 2018-01-27 DIAGNOSIS — Z743 Need for continuous supervision: Secondary | ICD-10-CM | POA: Diagnosis not present

## 2018-01-27 DIAGNOSIS — R627 Adult failure to thrive: Secondary | ICD-10-CM | POA: Diagnosis not present

## 2018-01-27 DIAGNOSIS — Z8739 Personal history of other diseases of the musculoskeletal system and connective tissue: Secondary | ICD-10-CM | POA: Diagnosis not present

## 2018-01-27 DIAGNOSIS — Z8639 Personal history of other endocrine, nutritional and metabolic disease: Secondary | ICD-10-CM | POA: Diagnosis not present

## 2018-01-27 DIAGNOSIS — Z87440 Personal history of urinary (tract) infections: Secondary | ICD-10-CM | POA: Diagnosis not present

## 2018-01-27 DIAGNOSIS — R634 Abnormal weight loss: Secondary | ICD-10-CM | POA: Diagnosis not present

## 2018-01-27 DIAGNOSIS — Z9981 Dependence on supplemental oxygen: Secondary | ICD-10-CM | POA: Diagnosis not present

## 2018-01-27 DIAGNOSIS — Z853 Personal history of malignant neoplasm of breast: Secondary | ICD-10-CM | POA: Diagnosis not present

## 2018-01-28 DIAGNOSIS — Z8679 Personal history of other diseases of the circulatory system: Secondary | ICD-10-CM | POA: Diagnosis not present

## 2018-01-28 DIAGNOSIS — I509 Heart failure, unspecified: Secondary | ICD-10-CM | POA: Diagnosis not present

## 2018-01-28 DIAGNOSIS — R627 Adult failure to thrive: Secondary | ICD-10-CM | POA: Diagnosis not present

## 2018-01-28 DIAGNOSIS — Z8639 Personal history of other endocrine, nutritional and metabolic disease: Secondary | ICD-10-CM | POA: Diagnosis not present

## 2018-01-28 DIAGNOSIS — Z8739 Personal history of other diseases of the musculoskeletal system and connective tissue: Secondary | ICD-10-CM | POA: Diagnosis not present

## 2018-01-28 DIAGNOSIS — Z853 Personal history of malignant neoplasm of breast: Secondary | ICD-10-CM | POA: Diagnosis not present

## 2018-01-29 DIAGNOSIS — Z87440 Personal history of urinary (tract) infections: Secondary | ICD-10-CM | POA: Diagnosis not present

## 2018-01-29 DIAGNOSIS — Z8639 Personal history of other endocrine, nutritional and metabolic disease: Secondary | ICD-10-CM | POA: Diagnosis not present

## 2018-01-29 DIAGNOSIS — I509 Heart failure, unspecified: Secondary | ICD-10-CM | POA: Diagnosis not present

## 2018-01-29 DIAGNOSIS — Z8679 Personal history of other diseases of the circulatory system: Secondary | ICD-10-CM | POA: Diagnosis not present

## 2018-01-29 DIAGNOSIS — Z853 Personal history of malignant neoplasm of breast: Secondary | ICD-10-CM | POA: Diagnosis not present

## 2018-01-29 DIAGNOSIS — R627 Adult failure to thrive: Secondary | ICD-10-CM | POA: Diagnosis not present

## 2018-01-29 DIAGNOSIS — Z8719 Personal history of other diseases of the digestive system: Secondary | ICD-10-CM | POA: Diagnosis not present

## 2018-01-29 DIAGNOSIS — Z8739 Personal history of other diseases of the musculoskeletal system and connective tissue: Secondary | ICD-10-CM | POA: Diagnosis not present

## 2018-01-30 DIAGNOSIS — Z8739 Personal history of other diseases of the musculoskeletal system and connective tissue: Secondary | ICD-10-CM | POA: Diagnosis not present

## 2018-01-30 DIAGNOSIS — I509 Heart failure, unspecified: Secondary | ICD-10-CM | POA: Diagnosis not present

## 2018-01-30 DIAGNOSIS — R627 Adult failure to thrive: Secondary | ICD-10-CM | POA: Diagnosis not present

## 2018-01-30 DIAGNOSIS — Z8639 Personal history of other endocrine, nutritional and metabolic disease: Secondary | ICD-10-CM | POA: Diagnosis not present

## 2018-01-30 DIAGNOSIS — Z853 Personal history of malignant neoplasm of breast: Secondary | ICD-10-CM | POA: Diagnosis not present

## 2018-01-30 DIAGNOSIS — Z8679 Personal history of other diseases of the circulatory system: Secondary | ICD-10-CM | POA: Diagnosis not present

## 2018-01-31 DIAGNOSIS — Z8679 Personal history of other diseases of the circulatory system: Secondary | ICD-10-CM | POA: Diagnosis not present

## 2018-01-31 DIAGNOSIS — Z853 Personal history of malignant neoplasm of breast: Secondary | ICD-10-CM | POA: Diagnosis not present

## 2018-01-31 DIAGNOSIS — Z8639 Personal history of other endocrine, nutritional and metabolic disease: Secondary | ICD-10-CM | POA: Diagnosis not present

## 2018-01-31 DIAGNOSIS — Z8739 Personal history of other diseases of the musculoskeletal system and connective tissue: Secondary | ICD-10-CM | POA: Diagnosis not present

## 2018-01-31 DIAGNOSIS — I509 Heart failure, unspecified: Secondary | ICD-10-CM | POA: Diagnosis not present

## 2018-01-31 DIAGNOSIS — R627 Adult failure to thrive: Secondary | ICD-10-CM | POA: Diagnosis not present

## 2018-02-01 DIAGNOSIS — R627 Adult failure to thrive: Secondary | ICD-10-CM | POA: Diagnosis not present

## 2018-02-01 DIAGNOSIS — Z8639 Personal history of other endocrine, nutritional and metabolic disease: Secondary | ICD-10-CM | POA: Diagnosis not present

## 2018-02-01 DIAGNOSIS — I509 Heart failure, unspecified: Secondary | ICD-10-CM | POA: Diagnosis not present

## 2018-02-01 DIAGNOSIS — Z8739 Personal history of other diseases of the musculoskeletal system and connective tissue: Secondary | ICD-10-CM | POA: Diagnosis not present

## 2018-02-01 DIAGNOSIS — Z8679 Personal history of other diseases of the circulatory system: Secondary | ICD-10-CM | POA: Diagnosis not present

## 2018-02-01 DIAGNOSIS — Z853 Personal history of malignant neoplasm of breast: Secondary | ICD-10-CM | POA: Diagnosis not present

## 2018-02-02 DIAGNOSIS — Z8679 Personal history of other diseases of the circulatory system: Secondary | ICD-10-CM | POA: Diagnosis not present

## 2018-02-02 DIAGNOSIS — Z853 Personal history of malignant neoplasm of breast: Secondary | ICD-10-CM | POA: Diagnosis not present

## 2018-02-02 DIAGNOSIS — Z8639 Personal history of other endocrine, nutritional and metabolic disease: Secondary | ICD-10-CM | POA: Diagnosis not present

## 2018-02-02 DIAGNOSIS — I509 Heart failure, unspecified: Secondary | ICD-10-CM | POA: Diagnosis not present

## 2018-02-02 DIAGNOSIS — Z8739 Personal history of other diseases of the musculoskeletal system and connective tissue: Secondary | ICD-10-CM | POA: Diagnosis not present

## 2018-02-02 DIAGNOSIS — R627 Adult failure to thrive: Secondary | ICD-10-CM | POA: Diagnosis not present

## 2018-02-03 DIAGNOSIS — I509 Heart failure, unspecified: Secondary | ICD-10-CM | POA: Diagnosis not present

## 2018-02-03 DIAGNOSIS — Z853 Personal history of malignant neoplasm of breast: Secondary | ICD-10-CM | POA: Diagnosis not present

## 2018-02-03 DIAGNOSIS — Z8739 Personal history of other diseases of the musculoskeletal system and connective tissue: Secondary | ICD-10-CM | POA: Diagnosis not present

## 2018-02-03 DIAGNOSIS — Z8639 Personal history of other endocrine, nutritional and metabolic disease: Secondary | ICD-10-CM | POA: Diagnosis not present

## 2018-02-03 DIAGNOSIS — R627 Adult failure to thrive: Secondary | ICD-10-CM | POA: Diagnosis not present

## 2018-02-03 DIAGNOSIS — Z8679 Personal history of other diseases of the circulatory system: Secondary | ICD-10-CM | POA: Diagnosis not present

## 2018-02-04 DIAGNOSIS — Z8739 Personal history of other diseases of the musculoskeletal system and connective tissue: Secondary | ICD-10-CM | POA: Diagnosis not present

## 2018-02-04 DIAGNOSIS — Z8639 Personal history of other endocrine, nutritional and metabolic disease: Secondary | ICD-10-CM | POA: Diagnosis not present

## 2018-02-04 DIAGNOSIS — Z8679 Personal history of other diseases of the circulatory system: Secondary | ICD-10-CM | POA: Diagnosis not present

## 2018-02-04 DIAGNOSIS — Z853 Personal history of malignant neoplasm of breast: Secondary | ICD-10-CM | POA: Diagnosis not present

## 2018-02-04 DIAGNOSIS — I509 Heart failure, unspecified: Secondary | ICD-10-CM | POA: Diagnosis not present

## 2018-02-04 DIAGNOSIS — R627 Adult failure to thrive: Secondary | ICD-10-CM | POA: Diagnosis not present

## 2018-02-06 DIAGNOSIS — Z8639 Personal history of other endocrine, nutritional and metabolic disease: Secondary | ICD-10-CM | POA: Diagnosis not present

## 2018-02-06 DIAGNOSIS — R627 Adult failure to thrive: Secondary | ICD-10-CM | POA: Diagnosis not present

## 2018-02-06 DIAGNOSIS — Z853 Personal history of malignant neoplasm of breast: Secondary | ICD-10-CM | POA: Diagnosis not present

## 2018-02-06 DIAGNOSIS — I509 Heart failure, unspecified: Secondary | ICD-10-CM | POA: Diagnosis not present

## 2018-02-06 DIAGNOSIS — Z8679 Personal history of other diseases of the circulatory system: Secondary | ICD-10-CM | POA: Diagnosis not present

## 2018-02-06 DIAGNOSIS — Z8739 Personal history of other diseases of the musculoskeletal system and connective tissue: Secondary | ICD-10-CM | POA: Diagnosis not present

## 2018-02-08 DIAGNOSIS — Z853 Personal history of malignant neoplasm of breast: Secondary | ICD-10-CM | POA: Diagnosis not present

## 2018-02-08 DIAGNOSIS — I509 Heart failure, unspecified: Secondary | ICD-10-CM | POA: Diagnosis not present

## 2018-02-08 DIAGNOSIS — Z8639 Personal history of other endocrine, nutritional and metabolic disease: Secondary | ICD-10-CM | POA: Diagnosis not present

## 2018-02-08 DIAGNOSIS — R627 Adult failure to thrive: Secondary | ICD-10-CM | POA: Diagnosis not present

## 2018-02-08 DIAGNOSIS — Z8739 Personal history of other diseases of the musculoskeletal system and connective tissue: Secondary | ICD-10-CM | POA: Diagnosis not present

## 2018-02-08 DIAGNOSIS — Z8679 Personal history of other diseases of the circulatory system: Secondary | ICD-10-CM | POA: Diagnosis not present

## 2018-02-09 DIAGNOSIS — Z853 Personal history of malignant neoplasm of breast: Secondary | ICD-10-CM | POA: Diagnosis not present

## 2018-02-09 DIAGNOSIS — Z8739 Personal history of other diseases of the musculoskeletal system and connective tissue: Secondary | ICD-10-CM | POA: Diagnosis not present

## 2018-02-09 DIAGNOSIS — R627 Adult failure to thrive: Secondary | ICD-10-CM | POA: Diagnosis not present

## 2018-02-09 DIAGNOSIS — Z8679 Personal history of other diseases of the circulatory system: Secondary | ICD-10-CM | POA: Diagnosis not present

## 2018-02-09 DIAGNOSIS — I509 Heart failure, unspecified: Secondary | ICD-10-CM | POA: Diagnosis not present

## 2018-02-09 DIAGNOSIS — Z8639 Personal history of other endocrine, nutritional and metabolic disease: Secondary | ICD-10-CM | POA: Diagnosis not present

## 2018-02-10 DIAGNOSIS — R627 Adult failure to thrive: Secondary | ICD-10-CM | POA: Diagnosis not present

## 2018-02-10 DIAGNOSIS — Z853 Personal history of malignant neoplasm of breast: Secondary | ICD-10-CM | POA: Diagnosis not present

## 2018-02-10 DIAGNOSIS — I509 Heart failure, unspecified: Secondary | ICD-10-CM | POA: Diagnosis not present

## 2018-02-10 DIAGNOSIS — Z8639 Personal history of other endocrine, nutritional and metabolic disease: Secondary | ICD-10-CM | POA: Diagnosis not present

## 2018-02-10 DIAGNOSIS — Z8739 Personal history of other diseases of the musculoskeletal system and connective tissue: Secondary | ICD-10-CM | POA: Diagnosis not present

## 2018-02-10 DIAGNOSIS — Z8679 Personal history of other diseases of the circulatory system: Secondary | ICD-10-CM | POA: Diagnosis not present

## 2018-02-13 DIAGNOSIS — I509 Heart failure, unspecified: Secondary | ICD-10-CM | POA: Diagnosis not present

## 2018-02-13 DIAGNOSIS — R627 Adult failure to thrive: Secondary | ICD-10-CM | POA: Diagnosis not present

## 2018-02-13 DIAGNOSIS — N390 Urinary tract infection, site not specified: Secondary | ICD-10-CM | POA: Diagnosis not present

## 2018-02-13 DIAGNOSIS — Z853 Personal history of malignant neoplasm of breast: Secondary | ICD-10-CM | POA: Diagnosis not present

## 2018-02-13 DIAGNOSIS — Z8679 Personal history of other diseases of the circulatory system: Secondary | ICD-10-CM | POA: Diagnosis not present

## 2018-02-13 DIAGNOSIS — Z8739 Personal history of other diseases of the musculoskeletal system and connective tissue: Secondary | ICD-10-CM | POA: Diagnosis not present

## 2018-02-13 DIAGNOSIS — Z8639 Personal history of other endocrine, nutritional and metabolic disease: Secondary | ICD-10-CM | POA: Diagnosis not present

## 2018-02-14 DIAGNOSIS — Z853 Personal history of malignant neoplasm of breast: Secondary | ICD-10-CM | POA: Diagnosis not present

## 2018-02-14 DIAGNOSIS — I509 Heart failure, unspecified: Secondary | ICD-10-CM | POA: Diagnosis not present

## 2018-02-14 DIAGNOSIS — R627 Adult failure to thrive: Secondary | ICD-10-CM | POA: Diagnosis not present

## 2018-02-14 DIAGNOSIS — Z8739 Personal history of other diseases of the musculoskeletal system and connective tissue: Secondary | ICD-10-CM | POA: Diagnosis not present

## 2018-02-14 DIAGNOSIS — Z8679 Personal history of other diseases of the circulatory system: Secondary | ICD-10-CM | POA: Diagnosis not present

## 2018-02-14 DIAGNOSIS — Z8639 Personal history of other endocrine, nutritional and metabolic disease: Secondary | ICD-10-CM | POA: Diagnosis not present

## 2018-02-15 DIAGNOSIS — Z8739 Personal history of other diseases of the musculoskeletal system and connective tissue: Secondary | ICD-10-CM | POA: Diagnosis not present

## 2018-02-15 DIAGNOSIS — Z8679 Personal history of other diseases of the circulatory system: Secondary | ICD-10-CM | POA: Diagnosis not present

## 2018-02-15 DIAGNOSIS — Z8639 Personal history of other endocrine, nutritional and metabolic disease: Secondary | ICD-10-CM | POA: Diagnosis not present

## 2018-02-15 DIAGNOSIS — Z853 Personal history of malignant neoplasm of breast: Secondary | ICD-10-CM | POA: Diagnosis not present

## 2018-02-15 DIAGNOSIS — I509 Heart failure, unspecified: Secondary | ICD-10-CM | POA: Diagnosis not present

## 2018-02-15 DIAGNOSIS — R627 Adult failure to thrive: Secondary | ICD-10-CM | POA: Diagnosis not present

## 2018-02-16 DIAGNOSIS — Z8739 Personal history of other diseases of the musculoskeletal system and connective tissue: Secondary | ICD-10-CM | POA: Diagnosis not present

## 2018-02-16 DIAGNOSIS — Z8639 Personal history of other endocrine, nutritional and metabolic disease: Secondary | ICD-10-CM | POA: Diagnosis not present

## 2018-02-16 DIAGNOSIS — R627 Adult failure to thrive: Secondary | ICD-10-CM | POA: Diagnosis not present

## 2018-02-16 DIAGNOSIS — I509 Heart failure, unspecified: Secondary | ICD-10-CM | POA: Diagnosis not present

## 2018-02-16 DIAGNOSIS — Z853 Personal history of malignant neoplasm of breast: Secondary | ICD-10-CM | POA: Diagnosis not present

## 2018-02-16 DIAGNOSIS — Z8679 Personal history of other diseases of the circulatory system: Secondary | ICD-10-CM | POA: Diagnosis not present

## 2018-02-17 DIAGNOSIS — I509 Heart failure, unspecified: Secondary | ICD-10-CM | POA: Diagnosis not present

## 2018-02-17 DIAGNOSIS — Z8679 Personal history of other diseases of the circulatory system: Secondary | ICD-10-CM | POA: Diagnosis not present

## 2018-02-17 DIAGNOSIS — Z8739 Personal history of other diseases of the musculoskeletal system and connective tissue: Secondary | ICD-10-CM | POA: Diagnosis not present

## 2018-02-17 DIAGNOSIS — Z853 Personal history of malignant neoplasm of breast: Secondary | ICD-10-CM | POA: Diagnosis not present

## 2018-02-17 DIAGNOSIS — R627 Adult failure to thrive: Secondary | ICD-10-CM | POA: Diagnosis not present

## 2018-02-17 DIAGNOSIS — Z8639 Personal history of other endocrine, nutritional and metabolic disease: Secondary | ICD-10-CM | POA: Diagnosis not present

## 2018-02-20 DIAGNOSIS — I509 Heart failure, unspecified: Secondary | ICD-10-CM | POA: Diagnosis not present

## 2018-02-20 DIAGNOSIS — Z8679 Personal history of other diseases of the circulatory system: Secondary | ICD-10-CM | POA: Diagnosis not present

## 2018-02-20 DIAGNOSIS — Z8639 Personal history of other endocrine, nutritional and metabolic disease: Secondary | ICD-10-CM | POA: Diagnosis not present

## 2018-02-20 DIAGNOSIS — R627 Adult failure to thrive: Secondary | ICD-10-CM | POA: Diagnosis not present

## 2018-02-20 DIAGNOSIS — Z853 Personal history of malignant neoplasm of breast: Secondary | ICD-10-CM | POA: Diagnosis not present

## 2018-02-20 DIAGNOSIS — Z8739 Personal history of other diseases of the musculoskeletal system and connective tissue: Secondary | ICD-10-CM | POA: Diagnosis not present

## 2018-02-22 DIAGNOSIS — I509 Heart failure, unspecified: Secondary | ICD-10-CM | POA: Diagnosis not present

## 2018-02-22 DIAGNOSIS — Z8639 Personal history of other endocrine, nutritional and metabolic disease: Secondary | ICD-10-CM | POA: Diagnosis not present

## 2018-02-22 DIAGNOSIS — Z8679 Personal history of other diseases of the circulatory system: Secondary | ICD-10-CM | POA: Diagnosis not present

## 2018-02-22 DIAGNOSIS — R627 Adult failure to thrive: Secondary | ICD-10-CM | POA: Diagnosis not present

## 2018-02-22 DIAGNOSIS — Z853 Personal history of malignant neoplasm of breast: Secondary | ICD-10-CM | POA: Diagnosis not present

## 2018-02-22 DIAGNOSIS — Z8739 Personal history of other diseases of the musculoskeletal system and connective tissue: Secondary | ICD-10-CM | POA: Diagnosis not present

## 2018-02-23 DIAGNOSIS — I509 Heart failure, unspecified: Secondary | ICD-10-CM | POA: Diagnosis not present

## 2018-02-23 DIAGNOSIS — Z8639 Personal history of other endocrine, nutritional and metabolic disease: Secondary | ICD-10-CM | POA: Diagnosis not present

## 2018-02-23 DIAGNOSIS — Z853 Personal history of malignant neoplasm of breast: Secondary | ICD-10-CM | POA: Diagnosis not present

## 2018-02-23 DIAGNOSIS — R627 Adult failure to thrive: Secondary | ICD-10-CM | POA: Diagnosis not present

## 2018-02-23 DIAGNOSIS — Z8679 Personal history of other diseases of the circulatory system: Secondary | ICD-10-CM | POA: Diagnosis not present

## 2018-02-23 DIAGNOSIS — Z8739 Personal history of other diseases of the musculoskeletal system and connective tissue: Secondary | ICD-10-CM | POA: Diagnosis not present

## 2018-02-24 DIAGNOSIS — Z8679 Personal history of other diseases of the circulatory system: Secondary | ICD-10-CM | POA: Diagnosis not present

## 2018-02-24 DIAGNOSIS — Z8639 Personal history of other endocrine, nutritional and metabolic disease: Secondary | ICD-10-CM | POA: Diagnosis not present

## 2018-02-24 DIAGNOSIS — R627 Adult failure to thrive: Secondary | ICD-10-CM | POA: Diagnosis not present

## 2018-02-24 DIAGNOSIS — Z853 Personal history of malignant neoplasm of breast: Secondary | ICD-10-CM | POA: Diagnosis not present

## 2018-02-24 DIAGNOSIS — I509 Heart failure, unspecified: Secondary | ICD-10-CM | POA: Diagnosis not present

## 2018-02-24 DIAGNOSIS — Z8739 Personal history of other diseases of the musculoskeletal system and connective tissue: Secondary | ICD-10-CM | POA: Diagnosis not present

## 2018-02-27 DIAGNOSIS — Z8639 Personal history of other endocrine, nutritional and metabolic disease: Secondary | ICD-10-CM | POA: Diagnosis not present

## 2018-02-27 DIAGNOSIS — I509 Heart failure, unspecified: Secondary | ICD-10-CM | POA: Diagnosis not present

## 2018-02-27 DIAGNOSIS — Z8739 Personal history of other diseases of the musculoskeletal system and connective tissue: Secondary | ICD-10-CM | POA: Diagnosis not present

## 2018-02-27 DIAGNOSIS — Z853 Personal history of malignant neoplasm of breast: Secondary | ICD-10-CM | POA: Diagnosis not present

## 2018-02-27 DIAGNOSIS — R627 Adult failure to thrive: Secondary | ICD-10-CM | POA: Diagnosis not present

## 2018-02-27 DIAGNOSIS — Z8679 Personal history of other diseases of the circulatory system: Secondary | ICD-10-CM | POA: Diagnosis not present

## 2018-02-28 ENCOUNTER — Institutional Professional Consult (permissible substitution): Payer: Medicare Other | Admitting: Plastic Surgery

## 2018-02-28 DIAGNOSIS — Z8679 Personal history of other diseases of the circulatory system: Secondary | ICD-10-CM | POA: Diagnosis not present

## 2018-02-28 DIAGNOSIS — I509 Heart failure, unspecified: Secondary | ICD-10-CM | POA: Diagnosis not present

## 2018-02-28 DIAGNOSIS — R627 Adult failure to thrive: Secondary | ICD-10-CM | POA: Diagnosis not present

## 2018-02-28 DIAGNOSIS — Z87440 Personal history of urinary (tract) infections: Secondary | ICD-10-CM | POA: Diagnosis not present

## 2018-02-28 DIAGNOSIS — Z8639 Personal history of other endocrine, nutritional and metabolic disease: Secondary | ICD-10-CM | POA: Diagnosis not present

## 2018-02-28 DIAGNOSIS — Z8739 Personal history of other diseases of the musculoskeletal system and connective tissue: Secondary | ICD-10-CM | POA: Diagnosis not present

## 2018-02-28 DIAGNOSIS — Z853 Personal history of malignant neoplasm of breast: Secondary | ICD-10-CM | POA: Diagnosis not present

## 2018-02-28 DIAGNOSIS — Z8719 Personal history of other diseases of the digestive system: Secondary | ICD-10-CM | POA: Diagnosis not present

## 2018-03-01 DIAGNOSIS — R627 Adult failure to thrive: Secondary | ICD-10-CM | POA: Diagnosis not present

## 2018-03-01 DIAGNOSIS — Z8739 Personal history of other diseases of the musculoskeletal system and connective tissue: Secondary | ICD-10-CM | POA: Diagnosis not present

## 2018-03-01 DIAGNOSIS — I509 Heart failure, unspecified: Secondary | ICD-10-CM | POA: Diagnosis not present

## 2018-03-01 DIAGNOSIS — Z8679 Personal history of other diseases of the circulatory system: Secondary | ICD-10-CM | POA: Diagnosis not present

## 2018-03-01 DIAGNOSIS — Z8639 Personal history of other endocrine, nutritional and metabolic disease: Secondary | ICD-10-CM | POA: Diagnosis not present

## 2018-03-01 DIAGNOSIS — Z853 Personal history of malignant neoplasm of breast: Secondary | ICD-10-CM | POA: Diagnosis not present

## 2018-03-03 DIAGNOSIS — Z8679 Personal history of other diseases of the circulatory system: Secondary | ICD-10-CM | POA: Diagnosis not present

## 2018-03-03 DIAGNOSIS — R627 Adult failure to thrive: Secondary | ICD-10-CM | POA: Diagnosis not present

## 2018-03-03 DIAGNOSIS — Z8739 Personal history of other diseases of the musculoskeletal system and connective tissue: Secondary | ICD-10-CM | POA: Diagnosis not present

## 2018-03-03 DIAGNOSIS — Z8639 Personal history of other endocrine, nutritional and metabolic disease: Secondary | ICD-10-CM | POA: Diagnosis not present

## 2018-03-03 DIAGNOSIS — Z853 Personal history of malignant neoplasm of breast: Secondary | ICD-10-CM | POA: Diagnosis not present

## 2018-03-03 DIAGNOSIS — I509 Heart failure, unspecified: Secondary | ICD-10-CM | POA: Diagnosis not present

## 2018-03-06 DIAGNOSIS — R627 Adult failure to thrive: Secondary | ICD-10-CM | POA: Diagnosis not present

## 2018-03-06 DIAGNOSIS — Z8739 Personal history of other diseases of the musculoskeletal system and connective tissue: Secondary | ICD-10-CM | POA: Diagnosis not present

## 2018-03-06 DIAGNOSIS — Z853 Personal history of malignant neoplasm of breast: Secondary | ICD-10-CM | POA: Diagnosis not present

## 2018-03-06 DIAGNOSIS — Z8679 Personal history of other diseases of the circulatory system: Secondary | ICD-10-CM | POA: Diagnosis not present

## 2018-03-06 DIAGNOSIS — I509 Heart failure, unspecified: Secondary | ICD-10-CM | POA: Diagnosis not present

## 2018-03-06 DIAGNOSIS — Z8639 Personal history of other endocrine, nutritional and metabolic disease: Secondary | ICD-10-CM | POA: Diagnosis not present

## 2018-03-07 DIAGNOSIS — I509 Heart failure, unspecified: Secondary | ICD-10-CM | POA: Diagnosis not present

## 2018-03-07 DIAGNOSIS — Z853 Personal history of malignant neoplasm of breast: Secondary | ICD-10-CM | POA: Diagnosis not present

## 2018-03-07 DIAGNOSIS — R627 Adult failure to thrive: Secondary | ICD-10-CM | POA: Diagnosis not present

## 2018-03-07 DIAGNOSIS — Z8679 Personal history of other diseases of the circulatory system: Secondary | ICD-10-CM | POA: Diagnosis not present

## 2018-03-07 DIAGNOSIS — Z8639 Personal history of other endocrine, nutritional and metabolic disease: Secondary | ICD-10-CM | POA: Diagnosis not present

## 2018-03-07 DIAGNOSIS — Z8739 Personal history of other diseases of the musculoskeletal system and connective tissue: Secondary | ICD-10-CM | POA: Diagnosis not present

## 2018-03-09 DIAGNOSIS — Z8679 Personal history of other diseases of the circulatory system: Secondary | ICD-10-CM | POA: Diagnosis not present

## 2018-03-09 DIAGNOSIS — R627 Adult failure to thrive: Secondary | ICD-10-CM | POA: Diagnosis not present

## 2018-03-09 DIAGNOSIS — Z8739 Personal history of other diseases of the musculoskeletal system and connective tissue: Secondary | ICD-10-CM | POA: Diagnosis not present

## 2018-03-09 DIAGNOSIS — Z8639 Personal history of other endocrine, nutritional and metabolic disease: Secondary | ICD-10-CM | POA: Diagnosis not present

## 2018-03-09 DIAGNOSIS — I509 Heart failure, unspecified: Secondary | ICD-10-CM | POA: Diagnosis not present

## 2018-03-09 DIAGNOSIS — Z853 Personal history of malignant neoplasm of breast: Secondary | ICD-10-CM | POA: Diagnosis not present

## 2018-03-10 DIAGNOSIS — Z8739 Personal history of other diseases of the musculoskeletal system and connective tissue: Secondary | ICD-10-CM | POA: Diagnosis not present

## 2018-03-10 DIAGNOSIS — Z853 Personal history of malignant neoplasm of breast: Secondary | ICD-10-CM | POA: Diagnosis not present

## 2018-03-10 DIAGNOSIS — R627 Adult failure to thrive: Secondary | ICD-10-CM | POA: Diagnosis not present

## 2018-03-10 DIAGNOSIS — Z8679 Personal history of other diseases of the circulatory system: Secondary | ICD-10-CM | POA: Diagnosis not present

## 2018-03-10 DIAGNOSIS — Z8639 Personal history of other endocrine, nutritional and metabolic disease: Secondary | ICD-10-CM | POA: Diagnosis not present

## 2018-03-10 DIAGNOSIS — I509 Heart failure, unspecified: Secondary | ICD-10-CM | POA: Diagnosis not present

## 2018-03-13 DIAGNOSIS — Z853 Personal history of malignant neoplasm of breast: Secondary | ICD-10-CM | POA: Diagnosis not present

## 2018-03-13 DIAGNOSIS — Z8739 Personal history of other diseases of the musculoskeletal system and connective tissue: Secondary | ICD-10-CM | POA: Diagnosis not present

## 2018-03-13 DIAGNOSIS — Z8639 Personal history of other endocrine, nutritional and metabolic disease: Secondary | ICD-10-CM | POA: Diagnosis not present

## 2018-03-13 DIAGNOSIS — I509 Heart failure, unspecified: Secondary | ICD-10-CM | POA: Diagnosis not present

## 2018-03-13 DIAGNOSIS — Z8679 Personal history of other diseases of the circulatory system: Secondary | ICD-10-CM | POA: Diagnosis not present

## 2018-03-13 DIAGNOSIS — R627 Adult failure to thrive: Secondary | ICD-10-CM | POA: Diagnosis not present

## 2018-03-14 DIAGNOSIS — Z8679 Personal history of other diseases of the circulatory system: Secondary | ICD-10-CM | POA: Diagnosis not present

## 2018-03-14 DIAGNOSIS — Z8739 Personal history of other diseases of the musculoskeletal system and connective tissue: Secondary | ICD-10-CM | POA: Diagnosis not present

## 2018-03-14 DIAGNOSIS — I509 Heart failure, unspecified: Secondary | ICD-10-CM | POA: Diagnosis not present

## 2018-03-14 DIAGNOSIS — Z8639 Personal history of other endocrine, nutritional and metabolic disease: Secondary | ICD-10-CM | POA: Diagnosis not present

## 2018-03-14 DIAGNOSIS — Z853 Personal history of malignant neoplasm of breast: Secondary | ICD-10-CM | POA: Diagnosis not present

## 2018-03-14 DIAGNOSIS — R627 Adult failure to thrive: Secondary | ICD-10-CM | POA: Diagnosis not present

## 2018-03-16 DIAGNOSIS — Z853 Personal history of malignant neoplasm of breast: Secondary | ICD-10-CM | POA: Diagnosis not present

## 2018-03-16 DIAGNOSIS — Z8679 Personal history of other diseases of the circulatory system: Secondary | ICD-10-CM | POA: Diagnosis not present

## 2018-03-16 DIAGNOSIS — R627 Adult failure to thrive: Secondary | ICD-10-CM | POA: Diagnosis not present

## 2018-03-16 DIAGNOSIS — Z8739 Personal history of other diseases of the musculoskeletal system and connective tissue: Secondary | ICD-10-CM | POA: Diagnosis not present

## 2018-03-16 DIAGNOSIS — Z8639 Personal history of other endocrine, nutritional and metabolic disease: Secondary | ICD-10-CM | POA: Diagnosis not present

## 2018-03-16 DIAGNOSIS — I509 Heart failure, unspecified: Secondary | ICD-10-CM | POA: Diagnosis not present

## 2018-03-17 DIAGNOSIS — R627 Adult failure to thrive: Secondary | ICD-10-CM | POA: Diagnosis not present

## 2018-03-17 DIAGNOSIS — I509 Heart failure, unspecified: Secondary | ICD-10-CM | POA: Diagnosis not present

## 2018-03-17 DIAGNOSIS — Z8679 Personal history of other diseases of the circulatory system: Secondary | ICD-10-CM | POA: Diagnosis not present

## 2018-03-17 DIAGNOSIS — Z853 Personal history of malignant neoplasm of breast: Secondary | ICD-10-CM | POA: Diagnosis not present

## 2018-03-17 DIAGNOSIS — Z8739 Personal history of other diseases of the musculoskeletal system and connective tissue: Secondary | ICD-10-CM | POA: Diagnosis not present

## 2018-03-17 DIAGNOSIS — Z8639 Personal history of other endocrine, nutritional and metabolic disease: Secondary | ICD-10-CM | POA: Diagnosis not present

## 2018-03-20 DIAGNOSIS — Z8739 Personal history of other diseases of the musculoskeletal system and connective tissue: Secondary | ICD-10-CM | POA: Diagnosis not present

## 2018-03-20 DIAGNOSIS — I509 Heart failure, unspecified: Secondary | ICD-10-CM | POA: Diagnosis not present

## 2018-03-20 DIAGNOSIS — Z853 Personal history of malignant neoplasm of breast: Secondary | ICD-10-CM | POA: Diagnosis not present

## 2018-03-20 DIAGNOSIS — Z8679 Personal history of other diseases of the circulatory system: Secondary | ICD-10-CM | POA: Diagnosis not present

## 2018-03-20 DIAGNOSIS — Z8639 Personal history of other endocrine, nutritional and metabolic disease: Secondary | ICD-10-CM | POA: Diagnosis not present

## 2018-03-20 DIAGNOSIS — R627 Adult failure to thrive: Secondary | ICD-10-CM | POA: Diagnosis not present

## 2018-03-21 DIAGNOSIS — Z8639 Personal history of other endocrine, nutritional and metabolic disease: Secondary | ICD-10-CM | POA: Diagnosis not present

## 2018-03-21 DIAGNOSIS — R627 Adult failure to thrive: Secondary | ICD-10-CM | POA: Diagnosis not present

## 2018-03-21 DIAGNOSIS — Z8739 Personal history of other diseases of the musculoskeletal system and connective tissue: Secondary | ICD-10-CM | POA: Diagnosis not present

## 2018-03-21 DIAGNOSIS — Z8679 Personal history of other diseases of the circulatory system: Secondary | ICD-10-CM | POA: Diagnosis not present

## 2018-03-21 DIAGNOSIS — I509 Heart failure, unspecified: Secondary | ICD-10-CM | POA: Diagnosis not present

## 2018-03-21 DIAGNOSIS — Z853 Personal history of malignant neoplasm of breast: Secondary | ICD-10-CM | POA: Diagnosis not present

## 2018-03-22 DIAGNOSIS — R627 Adult failure to thrive: Secondary | ICD-10-CM | POA: Diagnosis not present

## 2018-03-22 DIAGNOSIS — Z8739 Personal history of other diseases of the musculoskeletal system and connective tissue: Secondary | ICD-10-CM | POA: Diagnosis not present

## 2018-03-22 DIAGNOSIS — Z853 Personal history of malignant neoplasm of breast: Secondary | ICD-10-CM | POA: Diagnosis not present

## 2018-03-22 DIAGNOSIS — Z8679 Personal history of other diseases of the circulatory system: Secondary | ICD-10-CM | POA: Diagnosis not present

## 2018-03-22 DIAGNOSIS — Z8639 Personal history of other endocrine, nutritional and metabolic disease: Secondary | ICD-10-CM | POA: Diagnosis not present

## 2018-03-22 DIAGNOSIS — I509 Heart failure, unspecified: Secondary | ICD-10-CM | POA: Diagnosis not present

## 2018-03-23 DIAGNOSIS — Z8739 Personal history of other diseases of the musculoskeletal system and connective tissue: Secondary | ICD-10-CM | POA: Diagnosis not present

## 2018-03-23 DIAGNOSIS — Z853 Personal history of malignant neoplasm of breast: Secondary | ICD-10-CM | POA: Diagnosis not present

## 2018-03-23 DIAGNOSIS — I509 Heart failure, unspecified: Secondary | ICD-10-CM | POA: Diagnosis not present

## 2018-03-23 DIAGNOSIS — R627 Adult failure to thrive: Secondary | ICD-10-CM | POA: Diagnosis not present

## 2018-03-23 DIAGNOSIS — Z8639 Personal history of other endocrine, nutritional and metabolic disease: Secondary | ICD-10-CM | POA: Diagnosis not present

## 2018-03-23 DIAGNOSIS — Z8679 Personal history of other diseases of the circulatory system: Secondary | ICD-10-CM | POA: Diagnosis not present

## 2018-03-24 DIAGNOSIS — Z8739 Personal history of other diseases of the musculoskeletal system and connective tissue: Secondary | ICD-10-CM | POA: Diagnosis not present

## 2018-03-24 DIAGNOSIS — Z853 Personal history of malignant neoplasm of breast: Secondary | ICD-10-CM | POA: Diagnosis not present

## 2018-03-24 DIAGNOSIS — Z8639 Personal history of other endocrine, nutritional and metabolic disease: Secondary | ICD-10-CM | POA: Diagnosis not present

## 2018-03-24 DIAGNOSIS — R627 Adult failure to thrive: Secondary | ICD-10-CM | POA: Diagnosis not present

## 2018-03-24 DIAGNOSIS — I509 Heart failure, unspecified: Secondary | ICD-10-CM | POA: Diagnosis not present

## 2018-03-24 DIAGNOSIS — Z8679 Personal history of other diseases of the circulatory system: Secondary | ICD-10-CM | POA: Diagnosis not present

## 2018-03-25 DIAGNOSIS — Z8739 Personal history of other diseases of the musculoskeletal system and connective tissue: Secondary | ICD-10-CM | POA: Diagnosis not present

## 2018-03-25 DIAGNOSIS — I509 Heart failure, unspecified: Secondary | ICD-10-CM | POA: Diagnosis not present

## 2018-03-25 DIAGNOSIS — Z853 Personal history of malignant neoplasm of breast: Secondary | ICD-10-CM | POA: Diagnosis not present

## 2018-03-25 DIAGNOSIS — R627 Adult failure to thrive: Secondary | ICD-10-CM | POA: Diagnosis not present

## 2018-03-25 DIAGNOSIS — Z8679 Personal history of other diseases of the circulatory system: Secondary | ICD-10-CM | POA: Diagnosis not present

## 2018-03-25 DIAGNOSIS — Z8639 Personal history of other endocrine, nutritional and metabolic disease: Secondary | ICD-10-CM | POA: Diagnosis not present

## 2018-03-27 DIAGNOSIS — I509 Heart failure, unspecified: Secondary | ICD-10-CM | POA: Diagnosis not present

## 2018-03-27 DIAGNOSIS — Z8639 Personal history of other endocrine, nutritional and metabolic disease: Secondary | ICD-10-CM | POA: Diagnosis not present

## 2018-03-27 DIAGNOSIS — Z8679 Personal history of other diseases of the circulatory system: Secondary | ICD-10-CM | POA: Diagnosis not present

## 2018-03-27 DIAGNOSIS — Z8739 Personal history of other diseases of the musculoskeletal system and connective tissue: Secondary | ICD-10-CM | POA: Diagnosis not present

## 2018-03-27 DIAGNOSIS — Z853 Personal history of malignant neoplasm of breast: Secondary | ICD-10-CM | POA: Diagnosis not present

## 2018-03-27 DIAGNOSIS — R627 Adult failure to thrive: Secondary | ICD-10-CM | POA: Diagnosis not present

## 2018-03-28 DIAGNOSIS — Z8679 Personal history of other diseases of the circulatory system: Secondary | ICD-10-CM | POA: Diagnosis not present

## 2018-03-28 DIAGNOSIS — Z8639 Personal history of other endocrine, nutritional and metabolic disease: Secondary | ICD-10-CM | POA: Diagnosis not present

## 2018-03-28 DIAGNOSIS — Z8739 Personal history of other diseases of the musculoskeletal system and connective tissue: Secondary | ICD-10-CM | POA: Diagnosis not present

## 2018-03-28 DIAGNOSIS — I509 Heart failure, unspecified: Secondary | ICD-10-CM | POA: Diagnosis not present

## 2018-03-28 DIAGNOSIS — Z853 Personal history of malignant neoplasm of breast: Secondary | ICD-10-CM | POA: Diagnosis not present

## 2018-03-28 DIAGNOSIS — R627 Adult failure to thrive: Secondary | ICD-10-CM | POA: Diagnosis not present

## 2018-03-30 DIAGNOSIS — Z8679 Personal history of other diseases of the circulatory system: Secondary | ICD-10-CM | POA: Diagnosis not present

## 2018-03-30 DIAGNOSIS — I509 Heart failure, unspecified: Secondary | ICD-10-CM | POA: Diagnosis not present

## 2018-03-30 DIAGNOSIS — Z853 Personal history of malignant neoplasm of breast: Secondary | ICD-10-CM | POA: Diagnosis not present

## 2018-03-30 DIAGNOSIS — Z8739 Personal history of other diseases of the musculoskeletal system and connective tissue: Secondary | ICD-10-CM | POA: Diagnosis not present

## 2018-03-30 DIAGNOSIS — Z8639 Personal history of other endocrine, nutritional and metabolic disease: Secondary | ICD-10-CM | POA: Diagnosis not present

## 2018-03-30 DIAGNOSIS — R627 Adult failure to thrive: Secondary | ICD-10-CM | POA: Diagnosis not present

## 2018-03-31 DIAGNOSIS — I110 Hypertensive heart disease with heart failure: Secondary | ICD-10-CM | POA: Diagnosis not present

## 2018-03-31 DIAGNOSIS — Z8639 Personal history of other endocrine, nutritional and metabolic disease: Secondary | ICD-10-CM | POA: Diagnosis not present

## 2018-03-31 DIAGNOSIS — Z8739 Personal history of other diseases of the musculoskeletal system and connective tissue: Secondary | ICD-10-CM | POA: Diagnosis not present

## 2018-03-31 DIAGNOSIS — Z853 Personal history of malignant neoplasm of breast: Secondary | ICD-10-CM | POA: Diagnosis not present

## 2018-03-31 DIAGNOSIS — Z8719 Personal history of other diseases of the digestive system: Secondary | ICD-10-CM | POA: Diagnosis not present

## 2018-03-31 DIAGNOSIS — Z8679 Personal history of other diseases of the circulatory system: Secondary | ICD-10-CM | POA: Diagnosis not present

## 2018-03-31 DIAGNOSIS — R627 Adult failure to thrive: Secondary | ICD-10-CM | POA: Diagnosis not present

## 2018-03-31 DIAGNOSIS — I509 Heart failure, unspecified: Secondary | ICD-10-CM | POA: Diagnosis not present

## 2018-03-31 DIAGNOSIS — Z87440 Personal history of urinary (tract) infections: Secondary | ICD-10-CM | POA: Diagnosis not present

## 2018-04-03 DIAGNOSIS — Z8739 Personal history of other diseases of the musculoskeletal system and connective tissue: Secondary | ICD-10-CM | POA: Diagnosis not present

## 2018-04-03 DIAGNOSIS — R627 Adult failure to thrive: Secondary | ICD-10-CM | POA: Diagnosis not present

## 2018-04-03 DIAGNOSIS — I509 Heart failure, unspecified: Secondary | ICD-10-CM | POA: Diagnosis not present

## 2018-04-03 DIAGNOSIS — Z8679 Personal history of other diseases of the circulatory system: Secondary | ICD-10-CM | POA: Diagnosis not present

## 2018-04-03 DIAGNOSIS — Z853 Personal history of malignant neoplasm of breast: Secondary | ICD-10-CM | POA: Diagnosis not present

## 2018-04-03 DIAGNOSIS — I110 Hypertensive heart disease with heart failure: Secondary | ICD-10-CM | POA: Diagnosis not present

## 2018-04-05 DIAGNOSIS — Z8739 Personal history of other diseases of the musculoskeletal system and connective tissue: Secondary | ICD-10-CM | POA: Diagnosis not present

## 2018-04-05 DIAGNOSIS — Z853 Personal history of malignant neoplasm of breast: Secondary | ICD-10-CM | POA: Diagnosis not present

## 2018-04-05 DIAGNOSIS — Z8679 Personal history of other diseases of the circulatory system: Secondary | ICD-10-CM | POA: Diagnosis not present

## 2018-04-05 DIAGNOSIS — I509 Heart failure, unspecified: Secondary | ICD-10-CM | POA: Diagnosis not present

## 2018-04-05 DIAGNOSIS — I110 Hypertensive heart disease with heart failure: Secondary | ICD-10-CM | POA: Diagnosis not present

## 2018-04-05 DIAGNOSIS — R627 Adult failure to thrive: Secondary | ICD-10-CM | POA: Diagnosis not present

## 2018-04-07 DIAGNOSIS — Z8739 Personal history of other diseases of the musculoskeletal system and connective tissue: Secondary | ICD-10-CM | POA: Diagnosis not present

## 2018-04-07 DIAGNOSIS — Z853 Personal history of malignant neoplasm of breast: Secondary | ICD-10-CM | POA: Diagnosis not present

## 2018-04-07 DIAGNOSIS — I509 Heart failure, unspecified: Secondary | ICD-10-CM | POA: Diagnosis not present

## 2018-04-07 DIAGNOSIS — Z8679 Personal history of other diseases of the circulatory system: Secondary | ICD-10-CM | POA: Diagnosis not present

## 2018-04-07 DIAGNOSIS — I110 Hypertensive heart disease with heart failure: Secondary | ICD-10-CM | POA: Diagnosis not present

## 2018-04-07 DIAGNOSIS — R627 Adult failure to thrive: Secondary | ICD-10-CM | POA: Diagnosis not present

## 2018-04-10 DIAGNOSIS — Z8679 Personal history of other diseases of the circulatory system: Secondary | ICD-10-CM | POA: Diagnosis not present

## 2018-04-10 DIAGNOSIS — R627 Adult failure to thrive: Secondary | ICD-10-CM | POA: Diagnosis not present

## 2018-04-10 DIAGNOSIS — I509 Heart failure, unspecified: Secondary | ICD-10-CM | POA: Diagnosis not present

## 2018-04-10 DIAGNOSIS — Z8739 Personal history of other diseases of the musculoskeletal system and connective tissue: Secondary | ICD-10-CM | POA: Diagnosis not present

## 2018-04-10 DIAGNOSIS — I110 Hypertensive heart disease with heart failure: Secondary | ICD-10-CM | POA: Diagnosis not present

## 2018-04-10 DIAGNOSIS — Z853 Personal history of malignant neoplasm of breast: Secondary | ICD-10-CM | POA: Diagnosis not present

## 2018-04-12 DIAGNOSIS — I110 Hypertensive heart disease with heart failure: Secondary | ICD-10-CM | POA: Diagnosis not present

## 2018-04-12 DIAGNOSIS — Z8679 Personal history of other diseases of the circulatory system: Secondary | ICD-10-CM | POA: Diagnosis not present

## 2018-04-12 DIAGNOSIS — Z853 Personal history of malignant neoplasm of breast: Secondary | ICD-10-CM | POA: Diagnosis not present

## 2018-04-12 DIAGNOSIS — Z8739 Personal history of other diseases of the musculoskeletal system and connective tissue: Secondary | ICD-10-CM | POA: Diagnosis not present

## 2018-04-12 DIAGNOSIS — R627 Adult failure to thrive: Secondary | ICD-10-CM | POA: Diagnosis not present

## 2018-04-12 DIAGNOSIS — I509 Heart failure, unspecified: Secondary | ICD-10-CM | POA: Diagnosis not present

## 2018-04-14 DIAGNOSIS — Z8679 Personal history of other diseases of the circulatory system: Secondary | ICD-10-CM | POA: Diagnosis not present

## 2018-04-14 DIAGNOSIS — Z853 Personal history of malignant neoplasm of breast: Secondary | ICD-10-CM | POA: Diagnosis not present

## 2018-04-14 DIAGNOSIS — Z8739 Personal history of other diseases of the musculoskeletal system and connective tissue: Secondary | ICD-10-CM | POA: Diagnosis not present

## 2018-04-14 DIAGNOSIS — R627 Adult failure to thrive: Secondary | ICD-10-CM | POA: Diagnosis not present

## 2018-04-14 DIAGNOSIS — I509 Heart failure, unspecified: Secondary | ICD-10-CM | POA: Diagnosis not present

## 2018-04-14 DIAGNOSIS — I110 Hypertensive heart disease with heart failure: Secondary | ICD-10-CM | POA: Diagnosis not present

## 2018-04-17 DIAGNOSIS — Z8739 Personal history of other diseases of the musculoskeletal system and connective tissue: Secondary | ICD-10-CM | POA: Diagnosis not present

## 2018-04-17 DIAGNOSIS — I509 Heart failure, unspecified: Secondary | ICD-10-CM | POA: Diagnosis not present

## 2018-04-17 DIAGNOSIS — I110 Hypertensive heart disease with heart failure: Secondary | ICD-10-CM | POA: Diagnosis not present

## 2018-04-17 DIAGNOSIS — Z8679 Personal history of other diseases of the circulatory system: Secondary | ICD-10-CM | POA: Diagnosis not present

## 2018-04-17 DIAGNOSIS — Z853 Personal history of malignant neoplasm of breast: Secondary | ICD-10-CM | POA: Diagnosis not present

## 2018-04-17 DIAGNOSIS — R627 Adult failure to thrive: Secondary | ICD-10-CM | POA: Diagnosis not present

## 2018-04-19 ENCOUNTER — Other Ambulatory Visit: Payer: Self-pay

## 2018-04-19 DIAGNOSIS — Z8679 Personal history of other diseases of the circulatory system: Secondary | ICD-10-CM | POA: Diagnosis not present

## 2018-04-19 DIAGNOSIS — I110 Hypertensive heart disease with heart failure: Secondary | ICD-10-CM | POA: Diagnosis not present

## 2018-04-19 DIAGNOSIS — R627 Adult failure to thrive: Secondary | ICD-10-CM | POA: Diagnosis not present

## 2018-04-19 DIAGNOSIS — I509 Heart failure, unspecified: Secondary | ICD-10-CM | POA: Diagnosis not present

## 2018-04-19 DIAGNOSIS — Z853 Personal history of malignant neoplasm of breast: Secondary | ICD-10-CM | POA: Diagnosis not present

## 2018-04-19 DIAGNOSIS — Z8739 Personal history of other diseases of the musculoskeletal system and connective tissue: Secondary | ICD-10-CM | POA: Diagnosis not present

## 2018-04-21 DIAGNOSIS — I509 Heart failure, unspecified: Secondary | ICD-10-CM | POA: Diagnosis not present

## 2018-04-21 DIAGNOSIS — R627 Adult failure to thrive: Secondary | ICD-10-CM | POA: Diagnosis not present

## 2018-04-21 DIAGNOSIS — Z853 Personal history of malignant neoplasm of breast: Secondary | ICD-10-CM | POA: Diagnosis not present

## 2018-04-21 DIAGNOSIS — I110 Hypertensive heart disease with heart failure: Secondary | ICD-10-CM | POA: Diagnosis not present

## 2018-04-21 DIAGNOSIS — Z8739 Personal history of other diseases of the musculoskeletal system and connective tissue: Secondary | ICD-10-CM | POA: Diagnosis not present

## 2018-04-21 DIAGNOSIS — Z8679 Personal history of other diseases of the circulatory system: Secondary | ICD-10-CM | POA: Diagnosis not present

## 2018-04-24 DIAGNOSIS — Z8679 Personal history of other diseases of the circulatory system: Secondary | ICD-10-CM | POA: Diagnosis not present

## 2018-04-24 DIAGNOSIS — R627 Adult failure to thrive: Secondary | ICD-10-CM | POA: Diagnosis not present

## 2018-04-24 DIAGNOSIS — I110 Hypertensive heart disease with heart failure: Secondary | ICD-10-CM | POA: Diagnosis not present

## 2018-04-24 DIAGNOSIS — Z8739 Personal history of other diseases of the musculoskeletal system and connective tissue: Secondary | ICD-10-CM | POA: Diagnosis not present

## 2018-04-24 DIAGNOSIS — Z853 Personal history of malignant neoplasm of breast: Secondary | ICD-10-CM | POA: Diagnosis not present

## 2018-04-24 DIAGNOSIS — I509 Heart failure, unspecified: Secondary | ICD-10-CM | POA: Diagnosis not present

## 2018-04-26 DIAGNOSIS — Z8679 Personal history of other diseases of the circulatory system: Secondary | ICD-10-CM | POA: Diagnosis not present

## 2018-04-26 DIAGNOSIS — Z853 Personal history of malignant neoplasm of breast: Secondary | ICD-10-CM | POA: Diagnosis not present

## 2018-04-26 DIAGNOSIS — I110 Hypertensive heart disease with heart failure: Secondary | ICD-10-CM | POA: Diagnosis not present

## 2018-04-26 DIAGNOSIS — Z8739 Personal history of other diseases of the musculoskeletal system and connective tissue: Secondary | ICD-10-CM | POA: Diagnosis not present

## 2018-04-26 DIAGNOSIS — R627 Adult failure to thrive: Secondary | ICD-10-CM | POA: Diagnosis not present

## 2018-04-26 DIAGNOSIS — I509 Heart failure, unspecified: Secondary | ICD-10-CM | POA: Diagnosis not present

## 2018-04-28 DIAGNOSIS — Z8739 Personal history of other diseases of the musculoskeletal system and connective tissue: Secondary | ICD-10-CM | POA: Diagnosis not present

## 2018-04-28 DIAGNOSIS — R627 Adult failure to thrive: Secondary | ICD-10-CM | POA: Diagnosis not present

## 2018-04-28 DIAGNOSIS — I509 Heart failure, unspecified: Secondary | ICD-10-CM | POA: Diagnosis not present

## 2018-04-28 DIAGNOSIS — Z853 Personal history of malignant neoplasm of breast: Secondary | ICD-10-CM | POA: Diagnosis not present

## 2018-04-28 DIAGNOSIS — I110 Hypertensive heart disease with heart failure: Secondary | ICD-10-CM | POA: Diagnosis not present

## 2018-04-28 DIAGNOSIS — Z8679 Personal history of other diseases of the circulatory system: Secondary | ICD-10-CM | POA: Diagnosis not present

## 2018-04-30 DIAGNOSIS — Z8739 Personal history of other diseases of the musculoskeletal system and connective tissue: Secondary | ICD-10-CM | POA: Diagnosis not present

## 2018-04-30 DIAGNOSIS — Z87440 Personal history of urinary (tract) infections: Secondary | ICD-10-CM | POA: Diagnosis not present

## 2018-04-30 DIAGNOSIS — Z8639 Personal history of other endocrine, nutritional and metabolic disease: Secondary | ICD-10-CM | POA: Diagnosis not present

## 2018-04-30 DIAGNOSIS — I509 Heart failure, unspecified: Secondary | ICD-10-CM | POA: Diagnosis not present

## 2018-04-30 DIAGNOSIS — Z8719 Personal history of other diseases of the digestive system: Secondary | ICD-10-CM | POA: Diagnosis not present

## 2018-04-30 DIAGNOSIS — Z8679 Personal history of other diseases of the circulatory system: Secondary | ICD-10-CM | POA: Diagnosis not present

## 2018-04-30 DIAGNOSIS — R627 Adult failure to thrive: Secondary | ICD-10-CM | POA: Diagnosis not present

## 2018-04-30 DIAGNOSIS — Z853 Personal history of malignant neoplasm of breast: Secondary | ICD-10-CM | POA: Diagnosis not present

## 2018-04-30 DIAGNOSIS — I110 Hypertensive heart disease with heart failure: Secondary | ICD-10-CM | POA: Diagnosis not present

## 2018-05-01 DIAGNOSIS — Z8739 Personal history of other diseases of the musculoskeletal system and connective tissue: Secondary | ICD-10-CM | POA: Diagnosis not present

## 2018-05-01 DIAGNOSIS — Z853 Personal history of malignant neoplasm of breast: Secondary | ICD-10-CM | POA: Diagnosis not present

## 2018-05-01 DIAGNOSIS — I509 Heart failure, unspecified: Secondary | ICD-10-CM | POA: Diagnosis not present

## 2018-05-01 DIAGNOSIS — Z8679 Personal history of other diseases of the circulatory system: Secondary | ICD-10-CM | POA: Diagnosis not present

## 2018-05-01 DIAGNOSIS — R627 Adult failure to thrive: Secondary | ICD-10-CM | POA: Diagnosis not present

## 2018-05-01 DIAGNOSIS — I110 Hypertensive heart disease with heart failure: Secondary | ICD-10-CM | POA: Diagnosis not present

## 2018-05-02 DIAGNOSIS — Z853 Personal history of malignant neoplasm of breast: Secondary | ICD-10-CM | POA: Diagnosis not present

## 2018-05-02 DIAGNOSIS — Z8739 Personal history of other diseases of the musculoskeletal system and connective tissue: Secondary | ICD-10-CM | POA: Diagnosis not present

## 2018-05-02 DIAGNOSIS — R627 Adult failure to thrive: Secondary | ICD-10-CM | POA: Diagnosis not present

## 2018-05-02 DIAGNOSIS — Z8679 Personal history of other diseases of the circulatory system: Secondary | ICD-10-CM | POA: Diagnosis not present

## 2018-05-02 DIAGNOSIS — I509 Heart failure, unspecified: Secondary | ICD-10-CM | POA: Diagnosis not present

## 2018-05-02 DIAGNOSIS — I110 Hypertensive heart disease with heart failure: Secondary | ICD-10-CM | POA: Diagnosis not present

## 2018-05-03 DIAGNOSIS — Z8679 Personal history of other diseases of the circulatory system: Secondary | ICD-10-CM | POA: Diagnosis not present

## 2018-05-03 DIAGNOSIS — Z8739 Personal history of other diseases of the musculoskeletal system and connective tissue: Secondary | ICD-10-CM | POA: Diagnosis not present

## 2018-05-03 DIAGNOSIS — I110 Hypertensive heart disease with heart failure: Secondary | ICD-10-CM | POA: Diagnosis not present

## 2018-05-03 DIAGNOSIS — I509 Heart failure, unspecified: Secondary | ICD-10-CM | POA: Diagnosis not present

## 2018-05-03 DIAGNOSIS — Z853 Personal history of malignant neoplasm of breast: Secondary | ICD-10-CM | POA: Diagnosis not present

## 2018-05-03 DIAGNOSIS — R627 Adult failure to thrive: Secondary | ICD-10-CM | POA: Diagnosis not present

## 2018-05-05 DIAGNOSIS — R627 Adult failure to thrive: Secondary | ICD-10-CM | POA: Diagnosis not present

## 2018-05-05 DIAGNOSIS — Z853 Personal history of malignant neoplasm of breast: Secondary | ICD-10-CM | POA: Diagnosis not present

## 2018-05-05 DIAGNOSIS — Z8679 Personal history of other diseases of the circulatory system: Secondary | ICD-10-CM | POA: Diagnosis not present

## 2018-05-05 DIAGNOSIS — I509 Heart failure, unspecified: Secondary | ICD-10-CM | POA: Diagnosis not present

## 2018-05-05 DIAGNOSIS — I110 Hypertensive heart disease with heart failure: Secondary | ICD-10-CM | POA: Diagnosis not present

## 2018-05-05 DIAGNOSIS — Z8739 Personal history of other diseases of the musculoskeletal system and connective tissue: Secondary | ICD-10-CM | POA: Diagnosis not present

## 2018-05-08 DIAGNOSIS — Z8679 Personal history of other diseases of the circulatory system: Secondary | ICD-10-CM | POA: Diagnosis not present

## 2018-05-08 DIAGNOSIS — Z8739 Personal history of other diseases of the musculoskeletal system and connective tissue: Secondary | ICD-10-CM | POA: Diagnosis not present

## 2018-05-08 DIAGNOSIS — Z853 Personal history of malignant neoplasm of breast: Secondary | ICD-10-CM | POA: Diagnosis not present

## 2018-05-08 DIAGNOSIS — R627 Adult failure to thrive: Secondary | ICD-10-CM | POA: Diagnosis not present

## 2018-05-08 DIAGNOSIS — I110 Hypertensive heart disease with heart failure: Secondary | ICD-10-CM | POA: Diagnosis not present

## 2018-05-08 DIAGNOSIS — I509 Heart failure, unspecified: Secondary | ICD-10-CM | POA: Diagnosis not present

## 2018-05-09 DIAGNOSIS — Z8739 Personal history of other diseases of the musculoskeletal system and connective tissue: Secondary | ICD-10-CM | POA: Diagnosis not present

## 2018-05-09 DIAGNOSIS — I509 Heart failure, unspecified: Secondary | ICD-10-CM | POA: Diagnosis not present

## 2018-05-09 DIAGNOSIS — R627 Adult failure to thrive: Secondary | ICD-10-CM | POA: Diagnosis not present

## 2018-05-09 DIAGNOSIS — Z8679 Personal history of other diseases of the circulatory system: Secondary | ICD-10-CM | POA: Diagnosis not present

## 2018-05-09 DIAGNOSIS — Z853 Personal history of malignant neoplasm of breast: Secondary | ICD-10-CM | POA: Diagnosis not present

## 2018-05-09 DIAGNOSIS — I110 Hypertensive heart disease with heart failure: Secondary | ICD-10-CM | POA: Diagnosis not present

## 2018-05-10 DIAGNOSIS — Z8679 Personal history of other diseases of the circulatory system: Secondary | ICD-10-CM | POA: Diagnosis not present

## 2018-05-10 DIAGNOSIS — Z8739 Personal history of other diseases of the musculoskeletal system and connective tissue: Secondary | ICD-10-CM | POA: Diagnosis not present

## 2018-05-10 DIAGNOSIS — Z853 Personal history of malignant neoplasm of breast: Secondary | ICD-10-CM | POA: Diagnosis not present

## 2018-05-10 DIAGNOSIS — I509 Heart failure, unspecified: Secondary | ICD-10-CM | POA: Diagnosis not present

## 2018-05-10 DIAGNOSIS — I110 Hypertensive heart disease with heart failure: Secondary | ICD-10-CM | POA: Diagnosis not present

## 2018-05-10 DIAGNOSIS — R627 Adult failure to thrive: Secondary | ICD-10-CM | POA: Diagnosis not present

## 2018-05-12 DIAGNOSIS — Z853 Personal history of malignant neoplasm of breast: Secondary | ICD-10-CM | POA: Diagnosis not present

## 2018-05-12 DIAGNOSIS — I509 Heart failure, unspecified: Secondary | ICD-10-CM | POA: Diagnosis not present

## 2018-05-12 DIAGNOSIS — Z8739 Personal history of other diseases of the musculoskeletal system and connective tissue: Secondary | ICD-10-CM | POA: Diagnosis not present

## 2018-05-12 DIAGNOSIS — R627 Adult failure to thrive: Secondary | ICD-10-CM | POA: Diagnosis not present

## 2018-05-12 DIAGNOSIS — I110 Hypertensive heart disease with heart failure: Secondary | ICD-10-CM | POA: Diagnosis not present

## 2018-05-12 DIAGNOSIS — Z8679 Personal history of other diseases of the circulatory system: Secondary | ICD-10-CM | POA: Diagnosis not present

## 2018-05-15 DIAGNOSIS — I110 Hypertensive heart disease with heart failure: Secondary | ICD-10-CM | POA: Diagnosis not present

## 2018-05-15 DIAGNOSIS — Z853 Personal history of malignant neoplasm of breast: Secondary | ICD-10-CM | POA: Diagnosis not present

## 2018-05-15 DIAGNOSIS — Z8739 Personal history of other diseases of the musculoskeletal system and connective tissue: Secondary | ICD-10-CM | POA: Diagnosis not present

## 2018-05-15 DIAGNOSIS — R627 Adult failure to thrive: Secondary | ICD-10-CM | POA: Diagnosis not present

## 2018-05-15 DIAGNOSIS — Z8679 Personal history of other diseases of the circulatory system: Secondary | ICD-10-CM | POA: Diagnosis not present

## 2018-05-15 DIAGNOSIS — I509 Heart failure, unspecified: Secondary | ICD-10-CM | POA: Diagnosis not present

## 2018-05-16 DIAGNOSIS — I509 Heart failure, unspecified: Secondary | ICD-10-CM | POA: Diagnosis not present

## 2018-05-16 DIAGNOSIS — Z853 Personal history of malignant neoplasm of breast: Secondary | ICD-10-CM | POA: Diagnosis not present

## 2018-05-16 DIAGNOSIS — Z8739 Personal history of other diseases of the musculoskeletal system and connective tissue: Secondary | ICD-10-CM | POA: Diagnosis not present

## 2018-05-16 DIAGNOSIS — R627 Adult failure to thrive: Secondary | ICD-10-CM | POA: Diagnosis not present

## 2018-05-16 DIAGNOSIS — Z8679 Personal history of other diseases of the circulatory system: Secondary | ICD-10-CM | POA: Diagnosis not present

## 2018-05-16 DIAGNOSIS — I110 Hypertensive heart disease with heart failure: Secondary | ICD-10-CM | POA: Diagnosis not present

## 2018-05-17 DIAGNOSIS — Z8739 Personal history of other diseases of the musculoskeletal system and connective tissue: Secondary | ICD-10-CM | POA: Diagnosis not present

## 2018-05-17 DIAGNOSIS — R627 Adult failure to thrive: Secondary | ICD-10-CM | POA: Diagnosis not present

## 2018-05-17 DIAGNOSIS — I110 Hypertensive heart disease with heart failure: Secondary | ICD-10-CM | POA: Diagnosis not present

## 2018-05-17 DIAGNOSIS — I509 Heart failure, unspecified: Secondary | ICD-10-CM | POA: Diagnosis not present

## 2018-05-17 DIAGNOSIS — Z853 Personal history of malignant neoplasm of breast: Secondary | ICD-10-CM | POA: Diagnosis not present

## 2018-05-17 DIAGNOSIS — Z8679 Personal history of other diseases of the circulatory system: Secondary | ICD-10-CM | POA: Diagnosis not present

## 2018-05-18 DIAGNOSIS — R627 Adult failure to thrive: Secondary | ICD-10-CM | POA: Diagnosis not present

## 2018-05-18 DIAGNOSIS — Z8739 Personal history of other diseases of the musculoskeletal system and connective tissue: Secondary | ICD-10-CM | POA: Diagnosis not present

## 2018-05-18 DIAGNOSIS — Z8679 Personal history of other diseases of the circulatory system: Secondary | ICD-10-CM | POA: Diagnosis not present

## 2018-05-18 DIAGNOSIS — I110 Hypertensive heart disease with heart failure: Secondary | ICD-10-CM | POA: Diagnosis not present

## 2018-05-18 DIAGNOSIS — I509 Heart failure, unspecified: Secondary | ICD-10-CM | POA: Diagnosis not present

## 2018-05-18 DIAGNOSIS — Z853 Personal history of malignant neoplasm of breast: Secondary | ICD-10-CM | POA: Diagnosis not present

## 2018-05-19 DIAGNOSIS — Z8739 Personal history of other diseases of the musculoskeletal system and connective tissue: Secondary | ICD-10-CM | POA: Diagnosis not present

## 2018-05-19 DIAGNOSIS — I110 Hypertensive heart disease with heart failure: Secondary | ICD-10-CM | POA: Diagnosis not present

## 2018-05-19 DIAGNOSIS — Z853 Personal history of malignant neoplasm of breast: Secondary | ICD-10-CM | POA: Diagnosis not present

## 2018-05-19 DIAGNOSIS — Z8679 Personal history of other diseases of the circulatory system: Secondary | ICD-10-CM | POA: Diagnosis not present

## 2018-05-19 DIAGNOSIS — I509 Heart failure, unspecified: Secondary | ICD-10-CM | POA: Diagnosis not present

## 2018-05-19 DIAGNOSIS — R627 Adult failure to thrive: Secondary | ICD-10-CM | POA: Diagnosis not present

## 2018-05-22 DIAGNOSIS — Z853 Personal history of malignant neoplasm of breast: Secondary | ICD-10-CM | POA: Diagnosis not present

## 2018-05-22 DIAGNOSIS — I509 Heart failure, unspecified: Secondary | ICD-10-CM | POA: Diagnosis not present

## 2018-05-22 DIAGNOSIS — I110 Hypertensive heart disease with heart failure: Secondary | ICD-10-CM | POA: Diagnosis not present

## 2018-05-22 DIAGNOSIS — Z8679 Personal history of other diseases of the circulatory system: Secondary | ICD-10-CM | POA: Diagnosis not present

## 2018-05-22 DIAGNOSIS — R627 Adult failure to thrive: Secondary | ICD-10-CM | POA: Diagnosis not present

## 2018-05-22 DIAGNOSIS — Z8739 Personal history of other diseases of the musculoskeletal system and connective tissue: Secondary | ICD-10-CM | POA: Diagnosis not present

## 2018-05-23 DIAGNOSIS — Z853 Personal history of malignant neoplasm of breast: Secondary | ICD-10-CM | POA: Diagnosis not present

## 2018-05-23 DIAGNOSIS — Z8679 Personal history of other diseases of the circulatory system: Secondary | ICD-10-CM | POA: Diagnosis not present

## 2018-05-23 DIAGNOSIS — I110 Hypertensive heart disease with heart failure: Secondary | ICD-10-CM | POA: Diagnosis not present

## 2018-05-23 DIAGNOSIS — I509 Heart failure, unspecified: Secondary | ICD-10-CM | POA: Diagnosis not present

## 2018-05-23 DIAGNOSIS — R627 Adult failure to thrive: Secondary | ICD-10-CM | POA: Diagnosis not present

## 2018-05-23 DIAGNOSIS — Z8739 Personal history of other diseases of the musculoskeletal system and connective tissue: Secondary | ICD-10-CM | POA: Diagnosis not present

## 2018-05-25 DIAGNOSIS — R627 Adult failure to thrive: Secondary | ICD-10-CM | POA: Diagnosis not present

## 2018-05-25 DIAGNOSIS — I110 Hypertensive heart disease with heart failure: Secondary | ICD-10-CM | POA: Diagnosis not present

## 2018-05-25 DIAGNOSIS — I509 Heart failure, unspecified: Secondary | ICD-10-CM | POA: Diagnosis not present

## 2018-05-25 DIAGNOSIS — Z8739 Personal history of other diseases of the musculoskeletal system and connective tissue: Secondary | ICD-10-CM | POA: Diagnosis not present

## 2018-05-25 DIAGNOSIS — Z853 Personal history of malignant neoplasm of breast: Secondary | ICD-10-CM | POA: Diagnosis not present

## 2018-05-25 DIAGNOSIS — Z8679 Personal history of other diseases of the circulatory system: Secondary | ICD-10-CM | POA: Diagnosis not present

## 2018-05-26 DIAGNOSIS — I110 Hypertensive heart disease with heart failure: Secondary | ICD-10-CM | POA: Diagnosis not present

## 2018-05-26 DIAGNOSIS — R627 Adult failure to thrive: Secondary | ICD-10-CM | POA: Diagnosis not present

## 2018-05-26 DIAGNOSIS — I509 Heart failure, unspecified: Secondary | ICD-10-CM | POA: Diagnosis not present

## 2018-05-26 DIAGNOSIS — Z853 Personal history of malignant neoplasm of breast: Secondary | ICD-10-CM | POA: Diagnosis not present

## 2018-05-26 DIAGNOSIS — Z8739 Personal history of other diseases of the musculoskeletal system and connective tissue: Secondary | ICD-10-CM | POA: Diagnosis not present

## 2018-05-26 DIAGNOSIS — Z8679 Personal history of other diseases of the circulatory system: Secondary | ICD-10-CM | POA: Diagnosis not present

## 2018-05-29 DIAGNOSIS — Z853 Personal history of malignant neoplasm of breast: Secondary | ICD-10-CM | POA: Diagnosis not present

## 2018-05-29 DIAGNOSIS — Z8739 Personal history of other diseases of the musculoskeletal system and connective tissue: Secondary | ICD-10-CM | POA: Diagnosis not present

## 2018-05-29 DIAGNOSIS — I509 Heart failure, unspecified: Secondary | ICD-10-CM | POA: Diagnosis not present

## 2018-05-29 DIAGNOSIS — Z8679 Personal history of other diseases of the circulatory system: Secondary | ICD-10-CM | POA: Diagnosis not present

## 2018-05-29 DIAGNOSIS — R627 Adult failure to thrive: Secondary | ICD-10-CM | POA: Diagnosis not present

## 2018-05-29 DIAGNOSIS — I110 Hypertensive heart disease with heart failure: Secondary | ICD-10-CM | POA: Diagnosis not present

## 2018-05-30 DIAGNOSIS — R627 Adult failure to thrive: Secondary | ICD-10-CM | POA: Diagnosis not present

## 2018-05-30 DIAGNOSIS — I509 Heart failure, unspecified: Secondary | ICD-10-CM | POA: Diagnosis not present

## 2018-05-30 DIAGNOSIS — Z8679 Personal history of other diseases of the circulatory system: Secondary | ICD-10-CM | POA: Diagnosis not present

## 2018-05-30 DIAGNOSIS — Z8739 Personal history of other diseases of the musculoskeletal system and connective tissue: Secondary | ICD-10-CM | POA: Diagnosis not present

## 2018-05-30 DIAGNOSIS — I110 Hypertensive heart disease with heart failure: Secondary | ICD-10-CM | POA: Diagnosis not present

## 2018-05-30 DIAGNOSIS — Z853 Personal history of malignant neoplasm of breast: Secondary | ICD-10-CM | POA: Diagnosis not present

## 2018-05-31 DIAGNOSIS — I110 Hypertensive heart disease with heart failure: Secondary | ICD-10-CM | POA: Diagnosis not present

## 2018-05-31 DIAGNOSIS — Z87440 Personal history of urinary (tract) infections: Secondary | ICD-10-CM | POA: Diagnosis not present

## 2018-05-31 DIAGNOSIS — Z8719 Personal history of other diseases of the digestive system: Secondary | ICD-10-CM | POA: Diagnosis not present

## 2018-05-31 DIAGNOSIS — Z8679 Personal history of other diseases of the circulatory system: Secondary | ICD-10-CM | POA: Diagnosis not present

## 2018-05-31 DIAGNOSIS — I509 Heart failure, unspecified: Secondary | ICD-10-CM | POA: Diagnosis not present

## 2018-05-31 DIAGNOSIS — R627 Adult failure to thrive: Secondary | ICD-10-CM | POA: Diagnosis not present

## 2018-05-31 DIAGNOSIS — Z853 Personal history of malignant neoplasm of breast: Secondary | ICD-10-CM | POA: Diagnosis not present

## 2018-05-31 DIAGNOSIS — Z8739 Personal history of other diseases of the musculoskeletal system and connective tissue: Secondary | ICD-10-CM | POA: Diagnosis not present

## 2018-05-31 DIAGNOSIS — Z8639 Personal history of other endocrine, nutritional and metabolic disease: Secondary | ICD-10-CM | POA: Diagnosis not present

## 2018-06-01 DIAGNOSIS — Z8739 Personal history of other diseases of the musculoskeletal system and connective tissue: Secondary | ICD-10-CM | POA: Diagnosis not present

## 2018-06-01 DIAGNOSIS — Z853 Personal history of malignant neoplasm of breast: Secondary | ICD-10-CM | POA: Diagnosis not present

## 2018-06-01 DIAGNOSIS — I509 Heart failure, unspecified: Secondary | ICD-10-CM | POA: Diagnosis not present

## 2018-06-01 DIAGNOSIS — Z8679 Personal history of other diseases of the circulatory system: Secondary | ICD-10-CM | POA: Diagnosis not present

## 2018-06-01 DIAGNOSIS — R627 Adult failure to thrive: Secondary | ICD-10-CM | POA: Diagnosis not present

## 2018-06-01 DIAGNOSIS — I110 Hypertensive heart disease with heart failure: Secondary | ICD-10-CM | POA: Diagnosis not present

## 2018-06-02 DIAGNOSIS — R627 Adult failure to thrive: Secondary | ICD-10-CM | POA: Diagnosis not present

## 2018-06-02 DIAGNOSIS — Z8739 Personal history of other diseases of the musculoskeletal system and connective tissue: Secondary | ICD-10-CM | POA: Diagnosis not present

## 2018-06-02 DIAGNOSIS — Z8679 Personal history of other diseases of the circulatory system: Secondary | ICD-10-CM | POA: Diagnosis not present

## 2018-06-02 DIAGNOSIS — I110 Hypertensive heart disease with heart failure: Secondary | ICD-10-CM | POA: Diagnosis not present

## 2018-06-02 DIAGNOSIS — I509 Heart failure, unspecified: Secondary | ICD-10-CM | POA: Diagnosis not present

## 2018-06-02 DIAGNOSIS — Z853 Personal history of malignant neoplasm of breast: Secondary | ICD-10-CM | POA: Diagnosis not present

## 2018-06-05 DIAGNOSIS — Z8739 Personal history of other diseases of the musculoskeletal system and connective tissue: Secondary | ICD-10-CM | POA: Diagnosis not present

## 2018-06-05 DIAGNOSIS — Z8679 Personal history of other diseases of the circulatory system: Secondary | ICD-10-CM | POA: Diagnosis not present

## 2018-06-05 DIAGNOSIS — R627 Adult failure to thrive: Secondary | ICD-10-CM | POA: Diagnosis not present

## 2018-06-05 DIAGNOSIS — I110 Hypertensive heart disease with heart failure: Secondary | ICD-10-CM | POA: Diagnosis not present

## 2018-06-05 DIAGNOSIS — Z853 Personal history of malignant neoplasm of breast: Secondary | ICD-10-CM | POA: Diagnosis not present

## 2018-06-05 DIAGNOSIS — I509 Heart failure, unspecified: Secondary | ICD-10-CM | POA: Diagnosis not present

## 2018-06-06 DIAGNOSIS — I110 Hypertensive heart disease with heart failure: Secondary | ICD-10-CM | POA: Diagnosis not present

## 2018-06-06 DIAGNOSIS — Z8739 Personal history of other diseases of the musculoskeletal system and connective tissue: Secondary | ICD-10-CM | POA: Diagnosis not present

## 2018-06-06 DIAGNOSIS — I509 Heart failure, unspecified: Secondary | ICD-10-CM | POA: Diagnosis not present

## 2018-06-06 DIAGNOSIS — Z8679 Personal history of other diseases of the circulatory system: Secondary | ICD-10-CM | POA: Diagnosis not present

## 2018-06-06 DIAGNOSIS — R627 Adult failure to thrive: Secondary | ICD-10-CM | POA: Diagnosis not present

## 2018-06-06 DIAGNOSIS — Z853 Personal history of malignant neoplasm of breast: Secondary | ICD-10-CM | POA: Diagnosis not present

## 2018-06-07 DIAGNOSIS — I110 Hypertensive heart disease with heart failure: Secondary | ICD-10-CM | POA: Diagnosis not present

## 2018-06-07 DIAGNOSIS — Z853 Personal history of malignant neoplasm of breast: Secondary | ICD-10-CM | POA: Diagnosis not present

## 2018-06-07 DIAGNOSIS — I509 Heart failure, unspecified: Secondary | ICD-10-CM | POA: Diagnosis not present

## 2018-06-07 DIAGNOSIS — R627 Adult failure to thrive: Secondary | ICD-10-CM | POA: Diagnosis not present

## 2018-06-07 DIAGNOSIS — Z8679 Personal history of other diseases of the circulatory system: Secondary | ICD-10-CM | POA: Diagnosis not present

## 2018-06-07 DIAGNOSIS — Z8739 Personal history of other diseases of the musculoskeletal system and connective tissue: Secondary | ICD-10-CM | POA: Diagnosis not present

## 2018-06-09 DIAGNOSIS — Z853 Personal history of malignant neoplasm of breast: Secondary | ICD-10-CM | POA: Diagnosis not present

## 2018-06-09 DIAGNOSIS — R627 Adult failure to thrive: Secondary | ICD-10-CM | POA: Diagnosis not present

## 2018-06-09 DIAGNOSIS — Z8739 Personal history of other diseases of the musculoskeletal system and connective tissue: Secondary | ICD-10-CM | POA: Diagnosis not present

## 2018-06-09 DIAGNOSIS — Z8679 Personal history of other diseases of the circulatory system: Secondary | ICD-10-CM | POA: Diagnosis not present

## 2018-06-09 DIAGNOSIS — I110 Hypertensive heart disease with heart failure: Secondary | ICD-10-CM | POA: Diagnosis not present

## 2018-06-09 DIAGNOSIS — I509 Heart failure, unspecified: Secondary | ICD-10-CM | POA: Diagnosis not present

## 2018-06-12 DIAGNOSIS — Z8739 Personal history of other diseases of the musculoskeletal system and connective tissue: Secondary | ICD-10-CM | POA: Diagnosis not present

## 2018-06-12 DIAGNOSIS — R627 Adult failure to thrive: Secondary | ICD-10-CM | POA: Diagnosis not present

## 2018-06-12 DIAGNOSIS — I509 Heart failure, unspecified: Secondary | ICD-10-CM | POA: Diagnosis not present

## 2018-06-12 DIAGNOSIS — I110 Hypertensive heart disease with heart failure: Secondary | ICD-10-CM | POA: Diagnosis not present

## 2018-06-12 DIAGNOSIS — Z853 Personal history of malignant neoplasm of breast: Secondary | ICD-10-CM | POA: Diagnosis not present

## 2018-06-12 DIAGNOSIS — Z8679 Personal history of other diseases of the circulatory system: Secondary | ICD-10-CM | POA: Diagnosis not present

## 2018-06-13 DIAGNOSIS — Z8739 Personal history of other diseases of the musculoskeletal system and connective tissue: Secondary | ICD-10-CM | POA: Diagnosis not present

## 2018-06-13 DIAGNOSIS — I110 Hypertensive heart disease with heart failure: Secondary | ICD-10-CM | POA: Diagnosis not present

## 2018-06-13 DIAGNOSIS — R627 Adult failure to thrive: Secondary | ICD-10-CM | POA: Diagnosis not present

## 2018-06-13 DIAGNOSIS — I509 Heart failure, unspecified: Secondary | ICD-10-CM | POA: Diagnosis not present

## 2018-06-13 DIAGNOSIS — Z853 Personal history of malignant neoplasm of breast: Secondary | ICD-10-CM | POA: Diagnosis not present

## 2018-06-13 DIAGNOSIS — Z8679 Personal history of other diseases of the circulatory system: Secondary | ICD-10-CM | POA: Diagnosis not present

## 2018-06-14 DIAGNOSIS — I110 Hypertensive heart disease with heart failure: Secondary | ICD-10-CM | POA: Diagnosis not present

## 2018-06-14 DIAGNOSIS — R627 Adult failure to thrive: Secondary | ICD-10-CM | POA: Diagnosis not present

## 2018-06-14 DIAGNOSIS — Z8739 Personal history of other diseases of the musculoskeletal system and connective tissue: Secondary | ICD-10-CM | POA: Diagnosis not present

## 2018-06-14 DIAGNOSIS — Z853 Personal history of malignant neoplasm of breast: Secondary | ICD-10-CM | POA: Diagnosis not present

## 2018-06-14 DIAGNOSIS — I509 Heart failure, unspecified: Secondary | ICD-10-CM | POA: Diagnosis not present

## 2018-06-14 DIAGNOSIS — Z8679 Personal history of other diseases of the circulatory system: Secondary | ICD-10-CM | POA: Diagnosis not present

## 2018-06-15 DIAGNOSIS — Z8739 Personal history of other diseases of the musculoskeletal system and connective tissue: Secondary | ICD-10-CM | POA: Diagnosis not present

## 2018-06-15 DIAGNOSIS — I110 Hypertensive heart disease with heart failure: Secondary | ICD-10-CM | POA: Diagnosis not present

## 2018-06-15 DIAGNOSIS — Z8679 Personal history of other diseases of the circulatory system: Secondary | ICD-10-CM | POA: Diagnosis not present

## 2018-06-15 DIAGNOSIS — I509 Heart failure, unspecified: Secondary | ICD-10-CM | POA: Diagnosis not present

## 2018-06-15 DIAGNOSIS — Z853 Personal history of malignant neoplasm of breast: Secondary | ICD-10-CM | POA: Diagnosis not present

## 2018-06-15 DIAGNOSIS — R627 Adult failure to thrive: Secondary | ICD-10-CM | POA: Diagnosis not present

## 2018-06-16 DIAGNOSIS — I110 Hypertensive heart disease with heart failure: Secondary | ICD-10-CM | POA: Diagnosis not present

## 2018-06-16 DIAGNOSIS — R627 Adult failure to thrive: Secondary | ICD-10-CM | POA: Diagnosis not present

## 2018-06-16 DIAGNOSIS — Z8739 Personal history of other diseases of the musculoskeletal system and connective tissue: Secondary | ICD-10-CM | POA: Diagnosis not present

## 2018-06-16 DIAGNOSIS — Z853 Personal history of malignant neoplasm of breast: Secondary | ICD-10-CM | POA: Diagnosis not present

## 2018-06-16 DIAGNOSIS — I509 Heart failure, unspecified: Secondary | ICD-10-CM | POA: Diagnosis not present

## 2018-06-16 DIAGNOSIS — Z8679 Personal history of other diseases of the circulatory system: Secondary | ICD-10-CM | POA: Diagnosis not present

## 2018-06-19 DIAGNOSIS — Z853 Personal history of malignant neoplasm of breast: Secondary | ICD-10-CM | POA: Diagnosis not present

## 2018-06-19 DIAGNOSIS — Z8739 Personal history of other diseases of the musculoskeletal system and connective tissue: Secondary | ICD-10-CM | POA: Diagnosis not present

## 2018-06-19 DIAGNOSIS — R627 Adult failure to thrive: Secondary | ICD-10-CM | POA: Diagnosis not present

## 2018-06-19 DIAGNOSIS — I110 Hypertensive heart disease with heart failure: Secondary | ICD-10-CM | POA: Diagnosis not present

## 2018-06-19 DIAGNOSIS — Z8679 Personal history of other diseases of the circulatory system: Secondary | ICD-10-CM | POA: Diagnosis not present

## 2018-06-19 DIAGNOSIS — I509 Heart failure, unspecified: Secondary | ICD-10-CM | POA: Diagnosis not present

## 2018-06-20 DIAGNOSIS — I509 Heart failure, unspecified: Secondary | ICD-10-CM | POA: Diagnosis not present

## 2018-06-20 DIAGNOSIS — I110 Hypertensive heart disease with heart failure: Secondary | ICD-10-CM | POA: Diagnosis not present

## 2018-06-20 DIAGNOSIS — Z8679 Personal history of other diseases of the circulatory system: Secondary | ICD-10-CM | POA: Diagnosis not present

## 2018-06-20 DIAGNOSIS — R627 Adult failure to thrive: Secondary | ICD-10-CM | POA: Diagnosis not present

## 2018-06-20 DIAGNOSIS — Z8739 Personal history of other diseases of the musculoskeletal system and connective tissue: Secondary | ICD-10-CM | POA: Diagnosis not present

## 2018-06-20 DIAGNOSIS — Z853 Personal history of malignant neoplasm of breast: Secondary | ICD-10-CM | POA: Diagnosis not present

## 2018-06-22 DIAGNOSIS — Z853 Personal history of malignant neoplasm of breast: Secondary | ICD-10-CM | POA: Diagnosis not present

## 2018-06-22 DIAGNOSIS — R627 Adult failure to thrive: Secondary | ICD-10-CM | POA: Diagnosis not present

## 2018-06-22 DIAGNOSIS — I110 Hypertensive heart disease with heart failure: Secondary | ICD-10-CM | POA: Diagnosis not present

## 2018-06-22 DIAGNOSIS — Z8679 Personal history of other diseases of the circulatory system: Secondary | ICD-10-CM | POA: Diagnosis not present

## 2018-06-22 DIAGNOSIS — Z8739 Personal history of other diseases of the musculoskeletal system and connective tissue: Secondary | ICD-10-CM | POA: Diagnosis not present

## 2018-06-22 DIAGNOSIS — I509 Heart failure, unspecified: Secondary | ICD-10-CM | POA: Diagnosis not present

## 2018-06-23 DIAGNOSIS — I110 Hypertensive heart disease with heart failure: Secondary | ICD-10-CM | POA: Diagnosis not present

## 2018-06-23 DIAGNOSIS — R627 Adult failure to thrive: Secondary | ICD-10-CM | POA: Diagnosis not present

## 2018-06-23 DIAGNOSIS — Z8679 Personal history of other diseases of the circulatory system: Secondary | ICD-10-CM | POA: Diagnosis not present

## 2018-06-23 DIAGNOSIS — Z853 Personal history of malignant neoplasm of breast: Secondary | ICD-10-CM | POA: Diagnosis not present

## 2018-06-23 DIAGNOSIS — I509 Heart failure, unspecified: Secondary | ICD-10-CM | POA: Diagnosis not present

## 2018-06-23 DIAGNOSIS — Z8739 Personal history of other diseases of the musculoskeletal system and connective tissue: Secondary | ICD-10-CM | POA: Diagnosis not present

## 2018-06-27 DIAGNOSIS — R627 Adult failure to thrive: Secondary | ICD-10-CM | POA: Diagnosis not present

## 2018-06-27 DIAGNOSIS — I110 Hypertensive heart disease with heart failure: Secondary | ICD-10-CM | POA: Diagnosis not present

## 2018-06-27 DIAGNOSIS — Z853 Personal history of malignant neoplasm of breast: Secondary | ICD-10-CM | POA: Diagnosis not present

## 2018-06-27 DIAGNOSIS — I509 Heart failure, unspecified: Secondary | ICD-10-CM | POA: Diagnosis not present

## 2018-06-27 DIAGNOSIS — Z8739 Personal history of other diseases of the musculoskeletal system and connective tissue: Secondary | ICD-10-CM | POA: Diagnosis not present

## 2018-06-27 DIAGNOSIS — Z8679 Personal history of other diseases of the circulatory system: Secondary | ICD-10-CM | POA: Diagnosis not present

## 2018-06-28 DIAGNOSIS — R627 Adult failure to thrive: Secondary | ICD-10-CM | POA: Diagnosis not present

## 2018-06-28 DIAGNOSIS — Z853 Personal history of malignant neoplasm of breast: Secondary | ICD-10-CM | POA: Diagnosis not present

## 2018-06-28 DIAGNOSIS — Z8739 Personal history of other diseases of the musculoskeletal system and connective tissue: Secondary | ICD-10-CM | POA: Diagnosis not present

## 2018-06-28 DIAGNOSIS — I509 Heart failure, unspecified: Secondary | ICD-10-CM | POA: Diagnosis not present

## 2018-06-28 DIAGNOSIS — Z8679 Personal history of other diseases of the circulatory system: Secondary | ICD-10-CM | POA: Diagnosis not present

## 2018-06-28 DIAGNOSIS — I110 Hypertensive heart disease with heart failure: Secondary | ICD-10-CM | POA: Diagnosis not present

## 2018-06-29 DIAGNOSIS — I110 Hypertensive heart disease with heart failure: Secondary | ICD-10-CM | POA: Diagnosis not present

## 2018-06-29 DIAGNOSIS — Z853 Personal history of malignant neoplasm of breast: Secondary | ICD-10-CM | POA: Diagnosis not present

## 2018-06-29 DIAGNOSIS — Z8739 Personal history of other diseases of the musculoskeletal system and connective tissue: Secondary | ICD-10-CM | POA: Diagnosis not present

## 2018-06-29 DIAGNOSIS — R627 Adult failure to thrive: Secondary | ICD-10-CM | POA: Diagnosis not present

## 2018-06-29 DIAGNOSIS — Z8679 Personal history of other diseases of the circulatory system: Secondary | ICD-10-CM | POA: Diagnosis not present

## 2018-06-29 DIAGNOSIS — I509 Heart failure, unspecified: Secondary | ICD-10-CM | POA: Diagnosis not present

## 2018-06-30 DIAGNOSIS — Z8739 Personal history of other diseases of the musculoskeletal system and connective tissue: Secondary | ICD-10-CM | POA: Diagnosis not present

## 2018-06-30 DIAGNOSIS — Z8679 Personal history of other diseases of the circulatory system: Secondary | ICD-10-CM | POA: Diagnosis not present

## 2018-06-30 DIAGNOSIS — I509 Heart failure, unspecified: Secondary | ICD-10-CM | POA: Diagnosis not present

## 2018-06-30 DIAGNOSIS — Z853 Personal history of malignant neoplasm of breast: Secondary | ICD-10-CM | POA: Diagnosis not present

## 2018-06-30 DIAGNOSIS — I110 Hypertensive heart disease with heart failure: Secondary | ICD-10-CM | POA: Diagnosis not present

## 2018-06-30 DIAGNOSIS — R627 Adult failure to thrive: Secondary | ICD-10-CM | POA: Diagnosis not present

## 2018-07-01 DIAGNOSIS — I110 Hypertensive heart disease with heart failure: Secondary | ICD-10-CM | POA: Diagnosis not present

## 2018-07-01 DIAGNOSIS — Z8639 Personal history of other endocrine, nutritional and metabolic disease: Secondary | ICD-10-CM | POA: Diagnosis not present

## 2018-07-01 DIAGNOSIS — R627 Adult failure to thrive: Secondary | ICD-10-CM | POA: Diagnosis not present

## 2018-07-01 DIAGNOSIS — I509 Heart failure, unspecified: Secondary | ICD-10-CM | POA: Diagnosis not present

## 2018-07-01 DIAGNOSIS — Z8719 Personal history of other diseases of the digestive system: Secondary | ICD-10-CM | POA: Diagnosis not present

## 2018-07-01 DIAGNOSIS — Z8739 Personal history of other diseases of the musculoskeletal system and connective tissue: Secondary | ICD-10-CM | POA: Diagnosis not present

## 2018-07-01 DIAGNOSIS — Z853 Personal history of malignant neoplasm of breast: Secondary | ICD-10-CM | POA: Diagnosis not present

## 2018-07-01 DIAGNOSIS — Z8679 Personal history of other diseases of the circulatory system: Secondary | ICD-10-CM | POA: Diagnosis not present

## 2018-07-01 DIAGNOSIS — Z87440 Personal history of urinary (tract) infections: Secondary | ICD-10-CM | POA: Diagnosis not present

## 2018-07-03 DIAGNOSIS — I110 Hypertensive heart disease with heart failure: Secondary | ICD-10-CM | POA: Diagnosis not present

## 2018-07-03 DIAGNOSIS — I509 Heart failure, unspecified: Secondary | ICD-10-CM | POA: Diagnosis not present

## 2018-07-03 DIAGNOSIS — Z8739 Personal history of other diseases of the musculoskeletal system and connective tissue: Secondary | ICD-10-CM | POA: Diagnosis not present

## 2018-07-03 DIAGNOSIS — Z8679 Personal history of other diseases of the circulatory system: Secondary | ICD-10-CM | POA: Diagnosis not present

## 2018-07-03 DIAGNOSIS — R627 Adult failure to thrive: Secondary | ICD-10-CM | POA: Diagnosis not present

## 2018-07-03 DIAGNOSIS — Z853 Personal history of malignant neoplasm of breast: Secondary | ICD-10-CM | POA: Diagnosis not present

## 2018-07-04 DIAGNOSIS — R627 Adult failure to thrive: Secondary | ICD-10-CM | POA: Diagnosis not present

## 2018-07-04 DIAGNOSIS — Z853 Personal history of malignant neoplasm of breast: Secondary | ICD-10-CM | POA: Diagnosis not present

## 2018-07-04 DIAGNOSIS — I110 Hypertensive heart disease with heart failure: Secondary | ICD-10-CM | POA: Diagnosis not present

## 2018-07-04 DIAGNOSIS — Z8739 Personal history of other diseases of the musculoskeletal system and connective tissue: Secondary | ICD-10-CM | POA: Diagnosis not present

## 2018-07-04 DIAGNOSIS — I509 Heart failure, unspecified: Secondary | ICD-10-CM | POA: Diagnosis not present

## 2018-07-04 DIAGNOSIS — Z8679 Personal history of other diseases of the circulatory system: Secondary | ICD-10-CM | POA: Diagnosis not present

## 2018-07-05 DIAGNOSIS — I110 Hypertensive heart disease with heart failure: Secondary | ICD-10-CM | POA: Diagnosis not present

## 2018-07-05 DIAGNOSIS — I509 Heart failure, unspecified: Secondary | ICD-10-CM | POA: Diagnosis not present

## 2018-07-05 DIAGNOSIS — Z853 Personal history of malignant neoplasm of breast: Secondary | ICD-10-CM | POA: Diagnosis not present

## 2018-07-05 DIAGNOSIS — Z8679 Personal history of other diseases of the circulatory system: Secondary | ICD-10-CM | POA: Diagnosis not present

## 2018-07-05 DIAGNOSIS — R627 Adult failure to thrive: Secondary | ICD-10-CM | POA: Diagnosis not present

## 2018-07-05 DIAGNOSIS — Z8739 Personal history of other diseases of the musculoskeletal system and connective tissue: Secondary | ICD-10-CM | POA: Diagnosis not present

## 2018-07-07 DIAGNOSIS — Z8679 Personal history of other diseases of the circulatory system: Secondary | ICD-10-CM | POA: Diagnosis not present

## 2018-07-07 DIAGNOSIS — Z8739 Personal history of other diseases of the musculoskeletal system and connective tissue: Secondary | ICD-10-CM | POA: Diagnosis not present

## 2018-07-07 DIAGNOSIS — Z853 Personal history of malignant neoplasm of breast: Secondary | ICD-10-CM | POA: Diagnosis not present

## 2018-07-07 DIAGNOSIS — I509 Heart failure, unspecified: Secondary | ICD-10-CM | POA: Diagnosis not present

## 2018-07-07 DIAGNOSIS — I110 Hypertensive heart disease with heart failure: Secondary | ICD-10-CM | POA: Diagnosis not present

## 2018-07-07 DIAGNOSIS — R627 Adult failure to thrive: Secondary | ICD-10-CM | POA: Diagnosis not present

## 2018-07-07 IMAGING — CT CT CHEST HIGH RESOLUTION W/O CM
2 of 5 series · 15 of 36 positions shown, 18 images · non-contrast
Comparison: 04/25/2012 chest CT.

CLINICAL DATA: Wheezing.  Interstitial lung disease.

EXAM:
CT CHEST WITHOUT CONTRAST
TECHNIQUE: Multidetector CT imaging of the chest was performed following the
standard protocol without intravenous contrast. High resolution
imaging of the lungs, as well as inspiratory and expiratory imaging,
was performed.

[Series 2: high resolution · axial · 0.55mm/px · z∈[-314,-40]mm · 12 of 151 slices shown, 15 images]
[im 7/151  mediastinal]
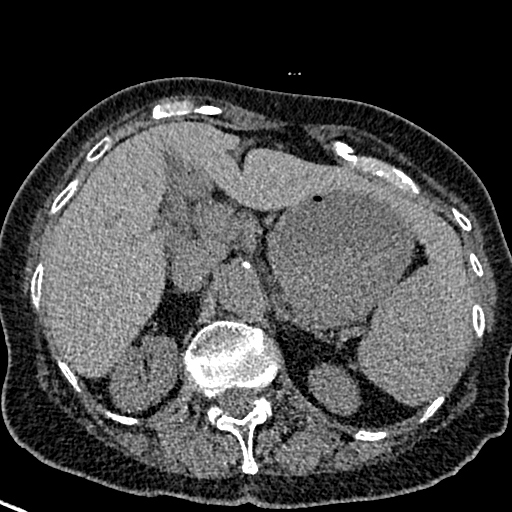
[im 7/151  lung]
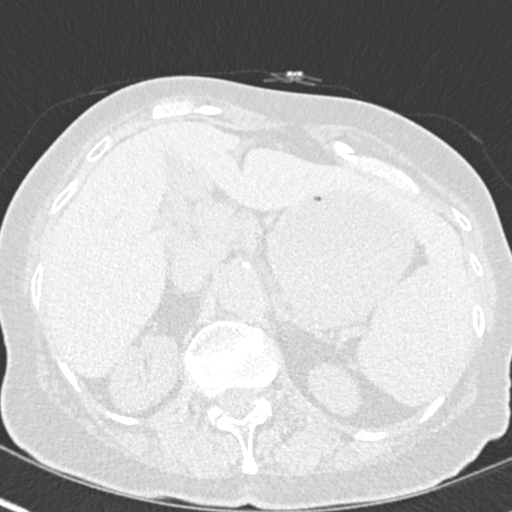
[im 21/151  lung]
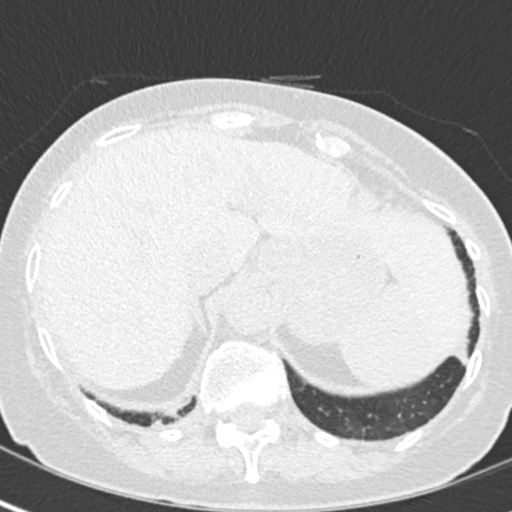
[im 35/151  lung]
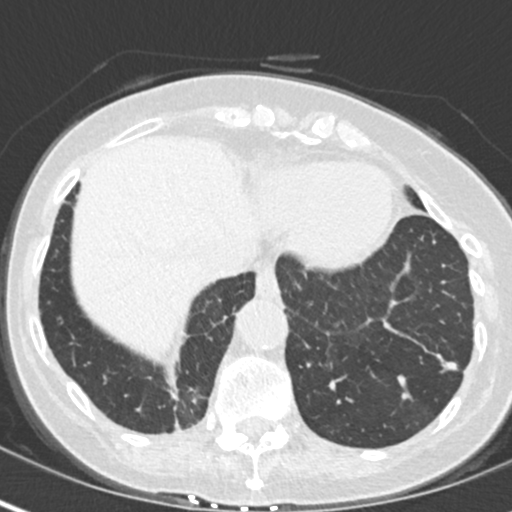
[im 48/151  lung]
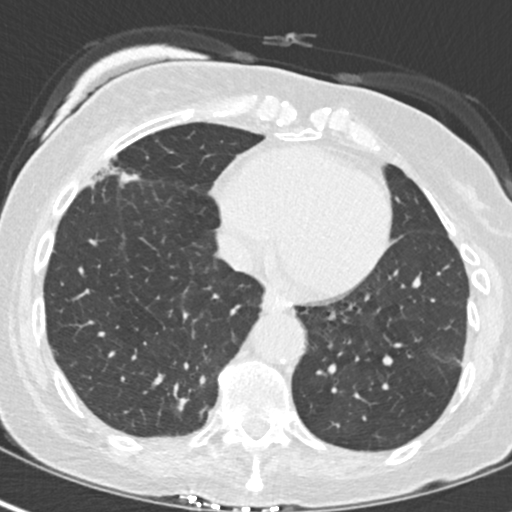
[im 55/151  mediastinal]
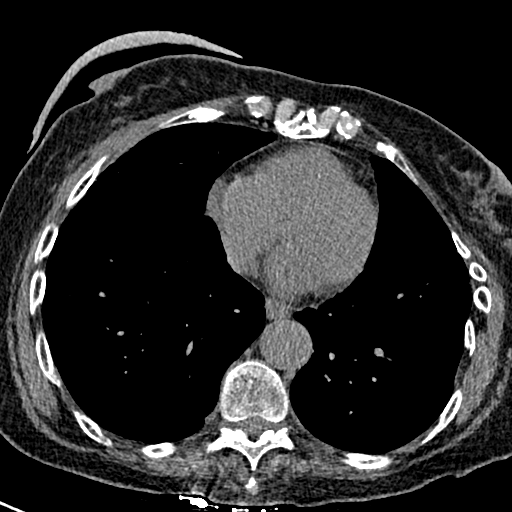
[im 55/151  lung]
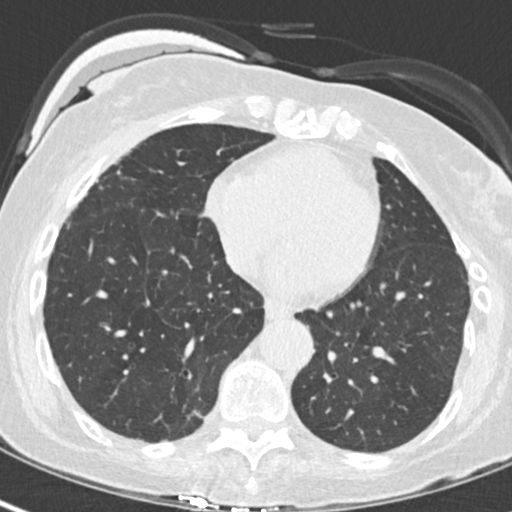
[im 69/151  lung]
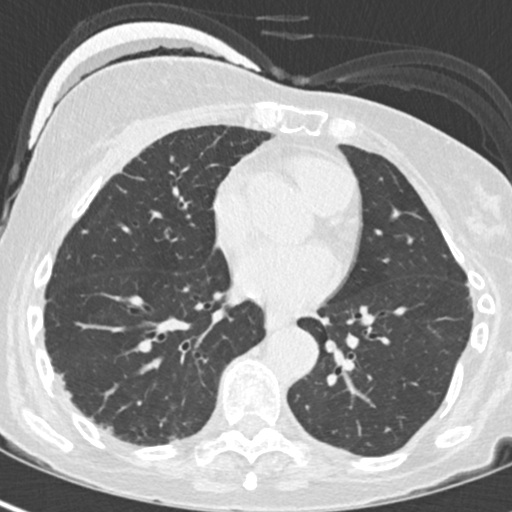
[im 82/151  lung]
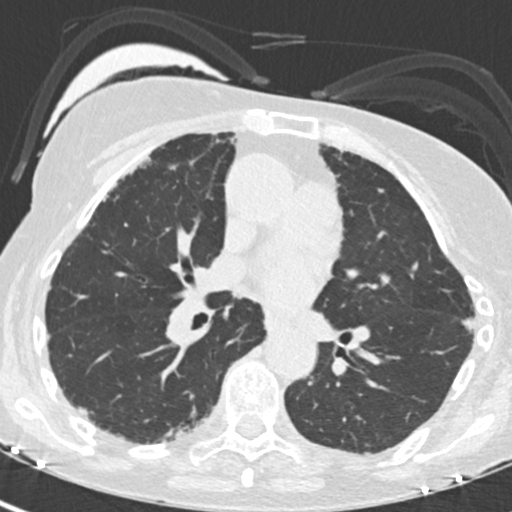
[im 96/151  lung]
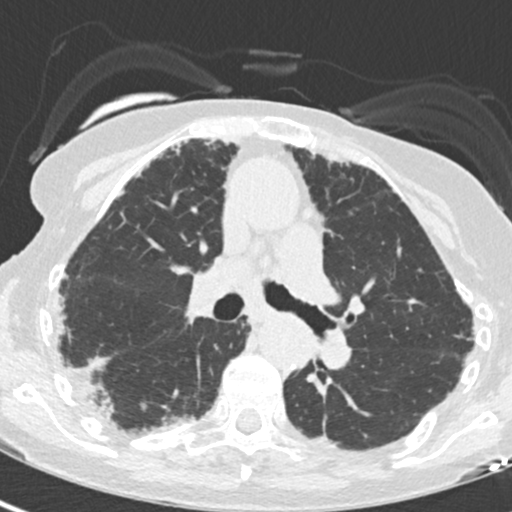
[im 103/151  mediastinal]
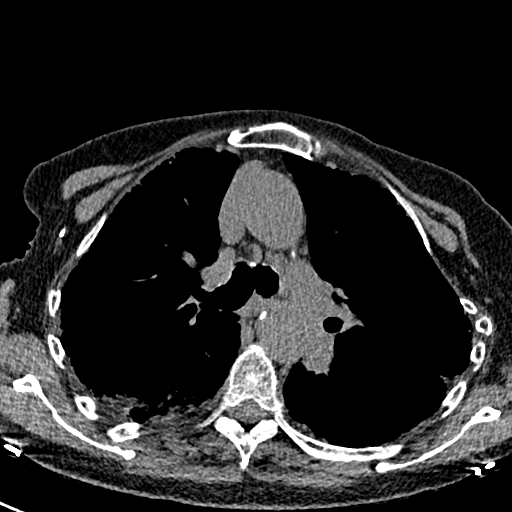
[im 103/151  lung]
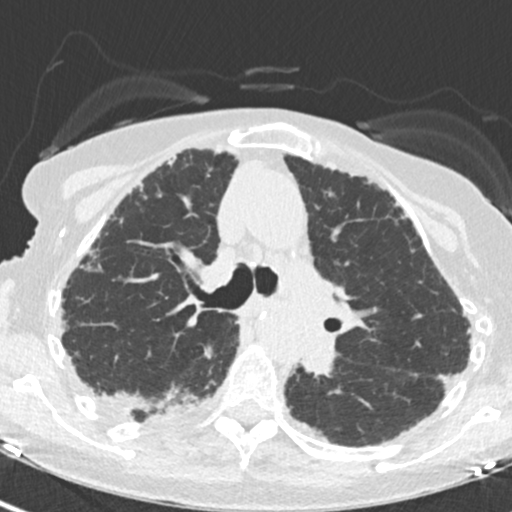
[im 116/151  lung]
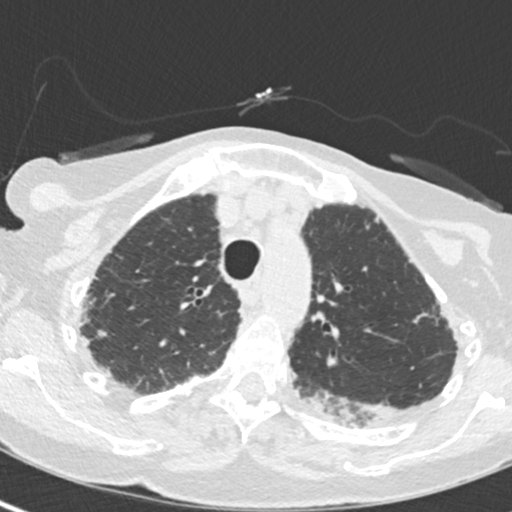
[im 130/151  lung]
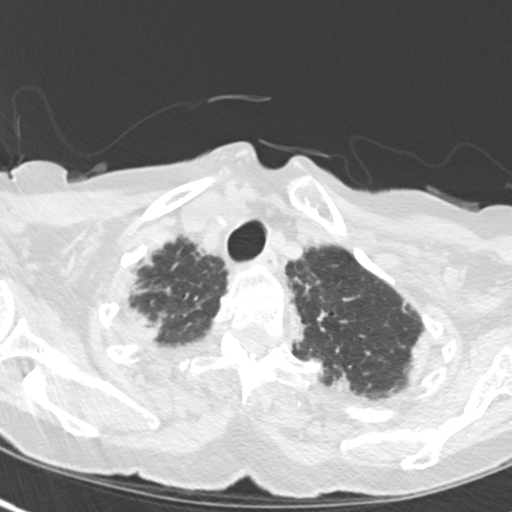
[im 144/151  lung]
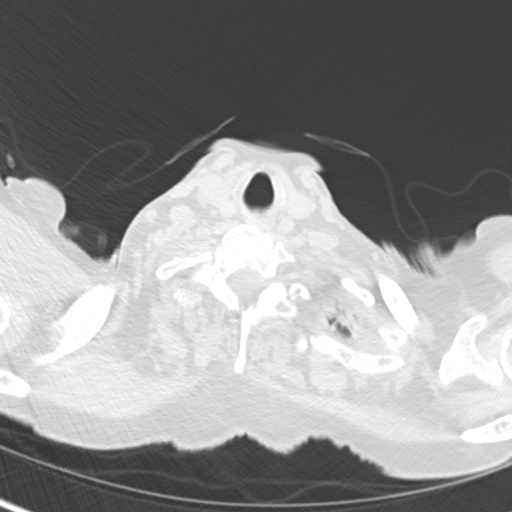

[Series 8: coronal · coronal · 0.52mm/px · 3 of 112 slices shown]
[im 23/112  lung]
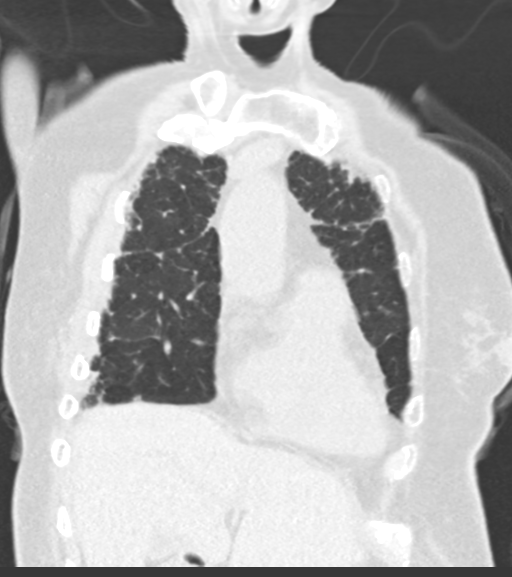
[im 45/112  lung]
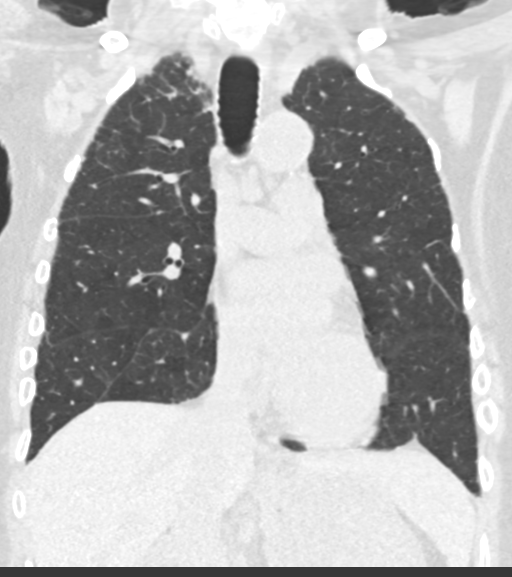
[im 67/112  lung]
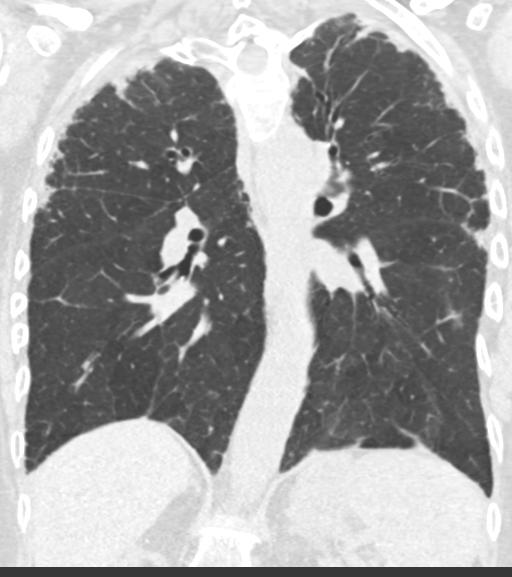

[15 of 36 positions shown; findings below may reference images not displayed]

FINDINGS: Mediastinum/Nodes: Normal heart size. No significant pericardial
fluid/thickening. Atherosclerotic nonaneurysmal thoracic aorta.
Normal caliber pulmonary arteries. No discrete thyroid nodules.
Unremarkable esophagus. Surgical clips are seen in the right axilla.
No axillary adenopathy. Mildly 1.0 cm right lower paratracheal node
(series 2/image 53), unchanged since 04/25/2012. No additional
pathologically enlarged mediastinal or gross hilar nodes on this
noncontrast study.

Lungs/Pleura: No pneumothorax. No pleural effusion. No acute
consolidative airspace disease. There is patchy subpleural
reticulation and mild traction bronchiectasis in both lungs with
relative sparing of the lung bases. Findings have progressed since
5665. No frank honeycombing. There are a few clustered pulmonary
nodules with ground-glass halos in the medial right lower lobe
measuring up to 0.9 cm (series 3/ image 103), new since 5665. There
is a mosaic attenuation throughout both lungs, due to extensive air
trapping as seen on the expiration sequence.

Upper abdomen: Simple appearing 1.5 cm left liver lobe cyst.

Musculoskeletal: No aggressive appearing focal osseous lesions. Mild
thoracic spondylosis. Stable chronic mild T7 vertebral compression
fracture.
IMPRESSION: 1. Fibrotic interstitial lung disease involving the mid to upper
lungs with relative sparing of the lung bases. No frank
honeycombing. Extensive air trapping. Progression of disease since
5665. Findings are not consistent with usual interstitial pneumonia
(UIP), and chronic hypersensitivity pneumonitis is favored.
2. New clustered nodularity in the medial right lower lobe, largest
0.9 cm, nonspecific, favor inflammatory. Non-contrast chest CT at
3-6 months is recommended. If the nodules are stable at time of
repeat CT, then future CT at 18-24 months (from today's scan) is
considered optional for low-risk patients, but is recommended for
high-risk patients. This recommendation follows the consensus
statement: Guidelines for Management of Incidental Pulmonary Nodules
Detected on CT Images:From the [HOSPITAL] 0743; published
online before print ([id number]/[id number]).
3. Mild mediastinal lymphadenopathy, stable since 5665, most
consistent with benign adenopathy.
4. Aortic atherosclerosis.

## 2018-07-10 DIAGNOSIS — I509 Heart failure, unspecified: Secondary | ICD-10-CM | POA: Diagnosis not present

## 2018-07-10 DIAGNOSIS — Z8679 Personal history of other diseases of the circulatory system: Secondary | ICD-10-CM | POA: Diagnosis not present

## 2018-07-10 DIAGNOSIS — Z8739 Personal history of other diseases of the musculoskeletal system and connective tissue: Secondary | ICD-10-CM | POA: Diagnosis not present

## 2018-07-10 DIAGNOSIS — R627 Adult failure to thrive: Secondary | ICD-10-CM | POA: Diagnosis not present

## 2018-07-10 DIAGNOSIS — Z853 Personal history of malignant neoplasm of breast: Secondary | ICD-10-CM | POA: Diagnosis not present

## 2018-07-10 DIAGNOSIS — I110 Hypertensive heart disease with heart failure: Secondary | ICD-10-CM | POA: Diagnosis not present

## 2018-07-11 DIAGNOSIS — I509 Heart failure, unspecified: Secondary | ICD-10-CM | POA: Diagnosis not present

## 2018-07-11 DIAGNOSIS — Z853 Personal history of malignant neoplasm of breast: Secondary | ICD-10-CM | POA: Diagnosis not present

## 2018-07-11 DIAGNOSIS — Z8739 Personal history of other diseases of the musculoskeletal system and connective tissue: Secondary | ICD-10-CM | POA: Diagnosis not present

## 2018-07-11 DIAGNOSIS — R627 Adult failure to thrive: Secondary | ICD-10-CM | POA: Diagnosis not present

## 2018-07-11 DIAGNOSIS — I110 Hypertensive heart disease with heart failure: Secondary | ICD-10-CM | POA: Diagnosis not present

## 2018-07-11 DIAGNOSIS — Z8679 Personal history of other diseases of the circulatory system: Secondary | ICD-10-CM | POA: Diagnosis not present

## 2018-07-12 DIAGNOSIS — I110 Hypertensive heart disease with heart failure: Secondary | ICD-10-CM | POA: Diagnosis not present

## 2018-07-12 DIAGNOSIS — Z853 Personal history of malignant neoplasm of breast: Secondary | ICD-10-CM | POA: Diagnosis not present

## 2018-07-12 DIAGNOSIS — Z8679 Personal history of other diseases of the circulatory system: Secondary | ICD-10-CM | POA: Diagnosis not present

## 2018-07-12 DIAGNOSIS — I509 Heart failure, unspecified: Secondary | ICD-10-CM | POA: Diagnosis not present

## 2018-07-12 DIAGNOSIS — R627 Adult failure to thrive: Secondary | ICD-10-CM | POA: Diagnosis not present

## 2018-07-12 DIAGNOSIS — Z8739 Personal history of other diseases of the musculoskeletal system and connective tissue: Secondary | ICD-10-CM | POA: Diagnosis not present

## 2018-07-13 DIAGNOSIS — R627 Adult failure to thrive: Secondary | ICD-10-CM | POA: Diagnosis not present

## 2018-07-13 DIAGNOSIS — I110 Hypertensive heart disease with heart failure: Secondary | ICD-10-CM | POA: Diagnosis not present

## 2018-07-13 DIAGNOSIS — Z853 Personal history of malignant neoplasm of breast: Secondary | ICD-10-CM | POA: Diagnosis not present

## 2018-07-13 DIAGNOSIS — I509 Heart failure, unspecified: Secondary | ICD-10-CM | POA: Diagnosis not present

## 2018-07-13 DIAGNOSIS — Z8679 Personal history of other diseases of the circulatory system: Secondary | ICD-10-CM | POA: Diagnosis not present

## 2018-07-13 DIAGNOSIS — Z8739 Personal history of other diseases of the musculoskeletal system and connective tissue: Secondary | ICD-10-CM | POA: Diagnosis not present

## 2018-07-14 DIAGNOSIS — Z8679 Personal history of other diseases of the circulatory system: Secondary | ICD-10-CM | POA: Diagnosis not present

## 2018-07-14 DIAGNOSIS — R627 Adult failure to thrive: Secondary | ICD-10-CM | POA: Diagnosis not present

## 2018-07-14 DIAGNOSIS — Z853 Personal history of malignant neoplasm of breast: Secondary | ICD-10-CM | POA: Diagnosis not present

## 2018-07-14 DIAGNOSIS — I110 Hypertensive heart disease with heart failure: Secondary | ICD-10-CM | POA: Diagnosis not present

## 2018-07-14 DIAGNOSIS — Z8739 Personal history of other diseases of the musculoskeletal system and connective tissue: Secondary | ICD-10-CM | POA: Diagnosis not present

## 2018-07-14 DIAGNOSIS — I509 Heart failure, unspecified: Secondary | ICD-10-CM | POA: Diagnosis not present

## 2018-07-17 DIAGNOSIS — I110 Hypertensive heart disease with heart failure: Secondary | ICD-10-CM | POA: Diagnosis not present

## 2018-07-17 DIAGNOSIS — I509 Heart failure, unspecified: Secondary | ICD-10-CM | POA: Diagnosis not present

## 2018-07-17 DIAGNOSIS — Z8739 Personal history of other diseases of the musculoskeletal system and connective tissue: Secondary | ICD-10-CM | POA: Diagnosis not present

## 2018-07-17 DIAGNOSIS — Z8679 Personal history of other diseases of the circulatory system: Secondary | ICD-10-CM | POA: Diagnosis not present

## 2018-07-17 DIAGNOSIS — Z853 Personal history of malignant neoplasm of breast: Secondary | ICD-10-CM | POA: Diagnosis not present

## 2018-07-17 DIAGNOSIS — R627 Adult failure to thrive: Secondary | ICD-10-CM | POA: Diagnosis not present

## 2018-07-18 DIAGNOSIS — I110 Hypertensive heart disease with heart failure: Secondary | ICD-10-CM | POA: Diagnosis not present

## 2018-07-18 DIAGNOSIS — I509 Heart failure, unspecified: Secondary | ICD-10-CM | POA: Diagnosis not present

## 2018-07-18 DIAGNOSIS — Z853 Personal history of malignant neoplasm of breast: Secondary | ICD-10-CM | POA: Diagnosis not present

## 2018-07-18 DIAGNOSIS — R627 Adult failure to thrive: Secondary | ICD-10-CM | POA: Diagnosis not present

## 2018-07-18 DIAGNOSIS — Z8739 Personal history of other diseases of the musculoskeletal system and connective tissue: Secondary | ICD-10-CM | POA: Diagnosis not present

## 2018-07-18 DIAGNOSIS — Z8679 Personal history of other diseases of the circulatory system: Secondary | ICD-10-CM | POA: Diagnosis not present

## 2018-07-19 DIAGNOSIS — Z8679 Personal history of other diseases of the circulatory system: Secondary | ICD-10-CM | POA: Diagnosis not present

## 2018-07-19 DIAGNOSIS — Z8739 Personal history of other diseases of the musculoskeletal system and connective tissue: Secondary | ICD-10-CM | POA: Diagnosis not present

## 2018-07-19 DIAGNOSIS — I509 Heart failure, unspecified: Secondary | ICD-10-CM | POA: Diagnosis not present

## 2018-07-19 DIAGNOSIS — R627 Adult failure to thrive: Secondary | ICD-10-CM | POA: Diagnosis not present

## 2018-07-19 DIAGNOSIS — I110 Hypertensive heart disease with heart failure: Secondary | ICD-10-CM | POA: Diagnosis not present

## 2018-07-19 DIAGNOSIS — Z853 Personal history of malignant neoplasm of breast: Secondary | ICD-10-CM | POA: Diagnosis not present

## 2018-07-21 DIAGNOSIS — Z8739 Personal history of other diseases of the musculoskeletal system and connective tissue: Secondary | ICD-10-CM | POA: Diagnosis not present

## 2018-07-21 DIAGNOSIS — Z8679 Personal history of other diseases of the circulatory system: Secondary | ICD-10-CM | POA: Diagnosis not present

## 2018-07-21 DIAGNOSIS — I509 Heart failure, unspecified: Secondary | ICD-10-CM | POA: Diagnosis not present

## 2018-07-21 DIAGNOSIS — I110 Hypertensive heart disease with heart failure: Secondary | ICD-10-CM | POA: Diagnosis not present

## 2018-07-21 DIAGNOSIS — Z853 Personal history of malignant neoplasm of breast: Secondary | ICD-10-CM | POA: Diagnosis not present

## 2018-07-21 DIAGNOSIS — R627 Adult failure to thrive: Secondary | ICD-10-CM | POA: Diagnosis not present

## 2018-07-24 DIAGNOSIS — I110 Hypertensive heart disease with heart failure: Secondary | ICD-10-CM | POA: Diagnosis not present

## 2018-07-24 DIAGNOSIS — Z8679 Personal history of other diseases of the circulatory system: Secondary | ICD-10-CM | POA: Diagnosis not present

## 2018-07-24 DIAGNOSIS — Z853 Personal history of malignant neoplasm of breast: Secondary | ICD-10-CM | POA: Diagnosis not present

## 2018-07-24 DIAGNOSIS — Z8739 Personal history of other diseases of the musculoskeletal system and connective tissue: Secondary | ICD-10-CM | POA: Diagnosis not present

## 2018-07-24 DIAGNOSIS — I509 Heart failure, unspecified: Secondary | ICD-10-CM | POA: Diagnosis not present

## 2018-07-24 DIAGNOSIS — R627 Adult failure to thrive: Secondary | ICD-10-CM | POA: Diagnosis not present

## 2018-07-27 DIAGNOSIS — Z8679 Personal history of other diseases of the circulatory system: Secondary | ICD-10-CM | POA: Diagnosis not present

## 2018-07-27 DIAGNOSIS — I110 Hypertensive heart disease with heart failure: Secondary | ICD-10-CM | POA: Diagnosis not present

## 2018-07-27 DIAGNOSIS — Z853 Personal history of malignant neoplasm of breast: Secondary | ICD-10-CM | POA: Diagnosis not present

## 2018-07-27 DIAGNOSIS — I509 Heart failure, unspecified: Secondary | ICD-10-CM | POA: Diagnosis not present

## 2018-07-27 DIAGNOSIS — R627 Adult failure to thrive: Secondary | ICD-10-CM | POA: Diagnosis not present

## 2018-07-27 DIAGNOSIS — Z8739 Personal history of other diseases of the musculoskeletal system and connective tissue: Secondary | ICD-10-CM | POA: Diagnosis not present

## 2018-07-28 DIAGNOSIS — I110 Hypertensive heart disease with heart failure: Secondary | ICD-10-CM | POA: Diagnosis not present

## 2018-07-28 DIAGNOSIS — Z8739 Personal history of other diseases of the musculoskeletal system and connective tissue: Secondary | ICD-10-CM | POA: Diagnosis not present

## 2018-07-28 DIAGNOSIS — I509 Heart failure, unspecified: Secondary | ICD-10-CM | POA: Diagnosis not present

## 2018-07-28 DIAGNOSIS — Z8679 Personal history of other diseases of the circulatory system: Secondary | ICD-10-CM | POA: Diagnosis not present

## 2018-07-28 DIAGNOSIS — Z853 Personal history of malignant neoplasm of breast: Secondary | ICD-10-CM | POA: Diagnosis not present

## 2018-07-28 DIAGNOSIS — R627 Adult failure to thrive: Secondary | ICD-10-CM | POA: Diagnosis not present

## 2018-07-30 DIAGNOSIS — I110 Hypertensive heart disease with heart failure: Secondary | ICD-10-CM | POA: Diagnosis not present

## 2018-07-30 DIAGNOSIS — R627 Adult failure to thrive: Secondary | ICD-10-CM | POA: Diagnosis not present

## 2018-07-30 DIAGNOSIS — Z87440 Personal history of urinary (tract) infections: Secondary | ICD-10-CM | POA: Diagnosis not present

## 2018-07-30 DIAGNOSIS — Z8739 Personal history of other diseases of the musculoskeletal system and connective tissue: Secondary | ICD-10-CM | POA: Diagnosis not present

## 2018-07-30 DIAGNOSIS — Z8679 Personal history of other diseases of the circulatory system: Secondary | ICD-10-CM | POA: Diagnosis not present

## 2018-07-30 DIAGNOSIS — I509 Heart failure, unspecified: Secondary | ICD-10-CM | POA: Diagnosis not present

## 2018-07-30 DIAGNOSIS — Z8719 Personal history of other diseases of the digestive system: Secondary | ICD-10-CM | POA: Diagnosis not present

## 2018-07-30 DIAGNOSIS — Z8639 Personal history of other endocrine, nutritional and metabolic disease: Secondary | ICD-10-CM | POA: Diagnosis not present

## 2018-07-30 DIAGNOSIS — Z853 Personal history of malignant neoplasm of breast: Secondary | ICD-10-CM | POA: Diagnosis not present

## 2018-07-31 DIAGNOSIS — Z853 Personal history of malignant neoplasm of breast: Secondary | ICD-10-CM | POA: Diagnosis not present

## 2018-07-31 DIAGNOSIS — Z8679 Personal history of other diseases of the circulatory system: Secondary | ICD-10-CM | POA: Diagnosis not present

## 2018-07-31 DIAGNOSIS — I509 Heart failure, unspecified: Secondary | ICD-10-CM | POA: Diagnosis not present

## 2018-07-31 DIAGNOSIS — R627 Adult failure to thrive: Secondary | ICD-10-CM | POA: Diagnosis not present

## 2018-07-31 DIAGNOSIS — Z8739 Personal history of other diseases of the musculoskeletal system and connective tissue: Secondary | ICD-10-CM | POA: Diagnosis not present

## 2018-07-31 DIAGNOSIS — I110 Hypertensive heart disease with heart failure: Secondary | ICD-10-CM | POA: Diagnosis not present

## 2018-08-01 DIAGNOSIS — I110 Hypertensive heart disease with heart failure: Secondary | ICD-10-CM | POA: Diagnosis not present

## 2018-08-01 DIAGNOSIS — Z8739 Personal history of other diseases of the musculoskeletal system and connective tissue: Secondary | ICD-10-CM | POA: Diagnosis not present

## 2018-08-01 DIAGNOSIS — I509 Heart failure, unspecified: Secondary | ICD-10-CM | POA: Diagnosis not present

## 2018-08-01 DIAGNOSIS — Z8679 Personal history of other diseases of the circulatory system: Secondary | ICD-10-CM | POA: Diagnosis not present

## 2018-08-01 DIAGNOSIS — Z853 Personal history of malignant neoplasm of breast: Secondary | ICD-10-CM | POA: Diagnosis not present

## 2018-08-01 DIAGNOSIS — R627 Adult failure to thrive: Secondary | ICD-10-CM | POA: Diagnosis not present

## 2018-08-03 DIAGNOSIS — I509 Heart failure, unspecified: Secondary | ICD-10-CM | POA: Diagnosis not present

## 2018-08-03 DIAGNOSIS — Z8679 Personal history of other diseases of the circulatory system: Secondary | ICD-10-CM | POA: Diagnosis not present

## 2018-08-03 DIAGNOSIS — R627 Adult failure to thrive: Secondary | ICD-10-CM | POA: Diagnosis not present

## 2018-08-03 DIAGNOSIS — Z8739 Personal history of other diseases of the musculoskeletal system and connective tissue: Secondary | ICD-10-CM | POA: Diagnosis not present

## 2018-08-03 DIAGNOSIS — Z853 Personal history of malignant neoplasm of breast: Secondary | ICD-10-CM | POA: Diagnosis not present

## 2018-08-03 DIAGNOSIS — I110 Hypertensive heart disease with heart failure: Secondary | ICD-10-CM | POA: Diagnosis not present

## 2018-08-04 DIAGNOSIS — Z8679 Personal history of other diseases of the circulatory system: Secondary | ICD-10-CM | POA: Diagnosis not present

## 2018-08-04 DIAGNOSIS — Z853 Personal history of malignant neoplasm of breast: Secondary | ICD-10-CM | POA: Diagnosis not present

## 2018-08-04 DIAGNOSIS — R627 Adult failure to thrive: Secondary | ICD-10-CM | POA: Diagnosis not present

## 2018-08-04 DIAGNOSIS — I509 Heart failure, unspecified: Secondary | ICD-10-CM | POA: Diagnosis not present

## 2018-08-04 DIAGNOSIS — Z8739 Personal history of other diseases of the musculoskeletal system and connective tissue: Secondary | ICD-10-CM | POA: Diagnosis not present

## 2018-08-04 DIAGNOSIS — I110 Hypertensive heart disease with heart failure: Secondary | ICD-10-CM | POA: Diagnosis not present

## 2018-08-07 DIAGNOSIS — R627 Adult failure to thrive: Secondary | ICD-10-CM | POA: Diagnosis not present

## 2018-08-07 DIAGNOSIS — Z853 Personal history of malignant neoplasm of breast: Secondary | ICD-10-CM | POA: Diagnosis not present

## 2018-08-07 DIAGNOSIS — Z8739 Personal history of other diseases of the musculoskeletal system and connective tissue: Secondary | ICD-10-CM | POA: Diagnosis not present

## 2018-08-07 DIAGNOSIS — I110 Hypertensive heart disease with heart failure: Secondary | ICD-10-CM | POA: Diagnosis not present

## 2018-08-07 DIAGNOSIS — I509 Heart failure, unspecified: Secondary | ICD-10-CM | POA: Diagnosis not present

## 2018-08-07 DIAGNOSIS — Z8679 Personal history of other diseases of the circulatory system: Secondary | ICD-10-CM | POA: Diagnosis not present

## 2018-08-08 DIAGNOSIS — Z8739 Personal history of other diseases of the musculoskeletal system and connective tissue: Secondary | ICD-10-CM | POA: Diagnosis not present

## 2018-08-08 DIAGNOSIS — Z853 Personal history of malignant neoplasm of breast: Secondary | ICD-10-CM | POA: Diagnosis not present

## 2018-08-08 DIAGNOSIS — I509 Heart failure, unspecified: Secondary | ICD-10-CM | POA: Diagnosis not present

## 2018-08-08 DIAGNOSIS — R627 Adult failure to thrive: Secondary | ICD-10-CM | POA: Diagnosis not present

## 2018-08-08 DIAGNOSIS — Z8679 Personal history of other diseases of the circulatory system: Secondary | ICD-10-CM | POA: Diagnosis not present

## 2018-08-08 DIAGNOSIS — I110 Hypertensive heart disease with heart failure: Secondary | ICD-10-CM | POA: Diagnosis not present

## 2018-08-11 DIAGNOSIS — Z8679 Personal history of other diseases of the circulatory system: Secondary | ICD-10-CM | POA: Diagnosis not present

## 2018-08-11 DIAGNOSIS — I509 Heart failure, unspecified: Secondary | ICD-10-CM | POA: Diagnosis not present

## 2018-08-11 DIAGNOSIS — Z853 Personal history of malignant neoplasm of breast: Secondary | ICD-10-CM | POA: Diagnosis not present

## 2018-08-11 DIAGNOSIS — I110 Hypertensive heart disease with heart failure: Secondary | ICD-10-CM | POA: Diagnosis not present

## 2018-08-11 DIAGNOSIS — R627 Adult failure to thrive: Secondary | ICD-10-CM | POA: Diagnosis not present

## 2018-08-11 DIAGNOSIS — Z8739 Personal history of other diseases of the musculoskeletal system and connective tissue: Secondary | ICD-10-CM | POA: Diagnosis not present

## 2018-08-14 DIAGNOSIS — Z853 Personal history of malignant neoplasm of breast: Secondary | ICD-10-CM | POA: Diagnosis not present

## 2018-08-14 DIAGNOSIS — Z8739 Personal history of other diseases of the musculoskeletal system and connective tissue: Secondary | ICD-10-CM | POA: Diagnosis not present

## 2018-08-14 DIAGNOSIS — Z8679 Personal history of other diseases of the circulatory system: Secondary | ICD-10-CM | POA: Diagnosis not present

## 2018-08-14 DIAGNOSIS — I110 Hypertensive heart disease with heart failure: Secondary | ICD-10-CM | POA: Diagnosis not present

## 2018-08-14 DIAGNOSIS — I509 Heart failure, unspecified: Secondary | ICD-10-CM | POA: Diagnosis not present

## 2018-08-14 DIAGNOSIS — R627 Adult failure to thrive: Secondary | ICD-10-CM | POA: Diagnosis not present

## 2018-08-15 DIAGNOSIS — R627 Adult failure to thrive: Secondary | ICD-10-CM | POA: Diagnosis not present

## 2018-08-15 DIAGNOSIS — I110 Hypertensive heart disease with heart failure: Secondary | ICD-10-CM | POA: Diagnosis not present

## 2018-08-15 DIAGNOSIS — Z8679 Personal history of other diseases of the circulatory system: Secondary | ICD-10-CM | POA: Diagnosis not present

## 2018-08-15 DIAGNOSIS — Z853 Personal history of malignant neoplasm of breast: Secondary | ICD-10-CM | POA: Diagnosis not present

## 2018-08-15 DIAGNOSIS — I509 Heart failure, unspecified: Secondary | ICD-10-CM | POA: Diagnosis not present

## 2018-08-15 DIAGNOSIS — Z8739 Personal history of other diseases of the musculoskeletal system and connective tissue: Secondary | ICD-10-CM | POA: Diagnosis not present

## 2018-08-16 DIAGNOSIS — I509 Heart failure, unspecified: Secondary | ICD-10-CM | POA: Diagnosis not present

## 2018-08-16 DIAGNOSIS — Z8679 Personal history of other diseases of the circulatory system: Secondary | ICD-10-CM | POA: Diagnosis not present

## 2018-08-16 DIAGNOSIS — Z853 Personal history of malignant neoplasm of breast: Secondary | ICD-10-CM | POA: Diagnosis not present

## 2018-08-16 DIAGNOSIS — R627 Adult failure to thrive: Secondary | ICD-10-CM | POA: Diagnosis not present

## 2018-08-16 DIAGNOSIS — Z8739 Personal history of other diseases of the musculoskeletal system and connective tissue: Secondary | ICD-10-CM | POA: Diagnosis not present

## 2018-08-16 DIAGNOSIS — I110 Hypertensive heart disease with heart failure: Secondary | ICD-10-CM | POA: Diagnosis not present

## 2018-08-17 DIAGNOSIS — R627 Adult failure to thrive: Secondary | ICD-10-CM | POA: Diagnosis not present

## 2018-08-17 DIAGNOSIS — I509 Heart failure, unspecified: Secondary | ICD-10-CM | POA: Diagnosis not present

## 2018-08-17 DIAGNOSIS — Z8679 Personal history of other diseases of the circulatory system: Secondary | ICD-10-CM | POA: Diagnosis not present

## 2018-08-17 DIAGNOSIS — Z853 Personal history of malignant neoplasm of breast: Secondary | ICD-10-CM | POA: Diagnosis not present

## 2018-08-17 DIAGNOSIS — Z8739 Personal history of other diseases of the musculoskeletal system and connective tissue: Secondary | ICD-10-CM | POA: Diagnosis not present

## 2018-08-17 DIAGNOSIS — I110 Hypertensive heart disease with heart failure: Secondary | ICD-10-CM | POA: Diagnosis not present

## 2018-08-18 DIAGNOSIS — I110 Hypertensive heart disease with heart failure: Secondary | ICD-10-CM | POA: Diagnosis not present

## 2018-08-18 DIAGNOSIS — I509 Heart failure, unspecified: Secondary | ICD-10-CM | POA: Diagnosis not present

## 2018-08-18 DIAGNOSIS — Z8739 Personal history of other diseases of the musculoskeletal system and connective tissue: Secondary | ICD-10-CM | POA: Diagnosis not present

## 2018-08-18 DIAGNOSIS — R627 Adult failure to thrive: Secondary | ICD-10-CM | POA: Diagnosis not present

## 2018-08-18 DIAGNOSIS — Z8679 Personal history of other diseases of the circulatory system: Secondary | ICD-10-CM | POA: Diagnosis not present

## 2018-08-18 DIAGNOSIS — Z853 Personal history of malignant neoplasm of breast: Secondary | ICD-10-CM | POA: Diagnosis not present

## 2018-08-21 DIAGNOSIS — R627 Adult failure to thrive: Secondary | ICD-10-CM | POA: Diagnosis not present

## 2018-08-21 DIAGNOSIS — Z8679 Personal history of other diseases of the circulatory system: Secondary | ICD-10-CM | POA: Diagnosis not present

## 2018-08-21 DIAGNOSIS — I509 Heart failure, unspecified: Secondary | ICD-10-CM | POA: Diagnosis not present

## 2018-08-21 DIAGNOSIS — Z8739 Personal history of other diseases of the musculoskeletal system and connective tissue: Secondary | ICD-10-CM | POA: Diagnosis not present

## 2018-08-21 DIAGNOSIS — I110 Hypertensive heart disease with heart failure: Secondary | ICD-10-CM | POA: Diagnosis not present

## 2018-08-21 DIAGNOSIS — Z853 Personal history of malignant neoplasm of breast: Secondary | ICD-10-CM | POA: Diagnosis not present

## 2018-08-22 DIAGNOSIS — I110 Hypertensive heart disease with heart failure: Secondary | ICD-10-CM | POA: Diagnosis not present

## 2018-08-22 DIAGNOSIS — Z853 Personal history of malignant neoplasm of breast: Secondary | ICD-10-CM | POA: Diagnosis not present

## 2018-08-22 DIAGNOSIS — Z8739 Personal history of other diseases of the musculoskeletal system and connective tissue: Secondary | ICD-10-CM | POA: Diagnosis not present

## 2018-08-22 DIAGNOSIS — R627 Adult failure to thrive: Secondary | ICD-10-CM | POA: Diagnosis not present

## 2018-08-22 DIAGNOSIS — I509 Heart failure, unspecified: Secondary | ICD-10-CM | POA: Diagnosis not present

## 2018-08-22 DIAGNOSIS — Z8679 Personal history of other diseases of the circulatory system: Secondary | ICD-10-CM | POA: Diagnosis not present

## 2018-08-23 DIAGNOSIS — Z853 Personal history of malignant neoplasm of breast: Secondary | ICD-10-CM | POA: Diagnosis not present

## 2018-08-23 DIAGNOSIS — Z8739 Personal history of other diseases of the musculoskeletal system and connective tissue: Secondary | ICD-10-CM | POA: Diagnosis not present

## 2018-08-23 DIAGNOSIS — I509 Heart failure, unspecified: Secondary | ICD-10-CM | POA: Diagnosis not present

## 2018-08-23 DIAGNOSIS — R627 Adult failure to thrive: Secondary | ICD-10-CM | POA: Diagnosis not present

## 2018-08-23 DIAGNOSIS — I110 Hypertensive heart disease with heart failure: Secondary | ICD-10-CM | POA: Diagnosis not present

## 2018-08-23 DIAGNOSIS — Z8679 Personal history of other diseases of the circulatory system: Secondary | ICD-10-CM | POA: Diagnosis not present

## 2018-08-25 DIAGNOSIS — I509 Heart failure, unspecified: Secondary | ICD-10-CM | POA: Diagnosis not present

## 2018-08-25 DIAGNOSIS — I110 Hypertensive heart disease with heart failure: Secondary | ICD-10-CM | POA: Diagnosis not present

## 2018-08-25 DIAGNOSIS — Z8679 Personal history of other diseases of the circulatory system: Secondary | ICD-10-CM | POA: Diagnosis not present

## 2018-08-25 DIAGNOSIS — R627 Adult failure to thrive: Secondary | ICD-10-CM | POA: Diagnosis not present

## 2018-08-25 DIAGNOSIS — Z8739 Personal history of other diseases of the musculoskeletal system and connective tissue: Secondary | ICD-10-CM | POA: Diagnosis not present

## 2018-08-25 DIAGNOSIS — Z853 Personal history of malignant neoplasm of breast: Secondary | ICD-10-CM | POA: Diagnosis not present

## 2018-08-29 DIAGNOSIS — R627 Adult failure to thrive: Secondary | ICD-10-CM | POA: Diagnosis not present

## 2018-08-29 DIAGNOSIS — Z853 Personal history of malignant neoplasm of breast: Secondary | ICD-10-CM | POA: Diagnosis not present

## 2018-08-29 DIAGNOSIS — Z8739 Personal history of other diseases of the musculoskeletal system and connective tissue: Secondary | ICD-10-CM | POA: Diagnosis not present

## 2018-08-29 DIAGNOSIS — I509 Heart failure, unspecified: Secondary | ICD-10-CM | POA: Diagnosis not present

## 2018-08-29 DIAGNOSIS — Z8679 Personal history of other diseases of the circulatory system: Secondary | ICD-10-CM | POA: Diagnosis not present

## 2018-08-29 DIAGNOSIS — I110 Hypertensive heart disease with heart failure: Secondary | ICD-10-CM | POA: Diagnosis not present

## 2018-08-30 DIAGNOSIS — I110 Hypertensive heart disease with heart failure: Secondary | ICD-10-CM | POA: Diagnosis not present

## 2018-08-30 DIAGNOSIS — Z87440 Personal history of urinary (tract) infections: Secondary | ICD-10-CM | POA: Diagnosis not present

## 2018-08-30 DIAGNOSIS — Z8639 Personal history of other endocrine, nutritional and metabolic disease: Secondary | ICD-10-CM | POA: Diagnosis not present

## 2018-08-30 DIAGNOSIS — R627 Adult failure to thrive: Secondary | ICD-10-CM | POA: Diagnosis not present

## 2018-08-30 DIAGNOSIS — Z853 Personal history of malignant neoplasm of breast: Secondary | ICD-10-CM | POA: Diagnosis not present

## 2018-08-30 DIAGNOSIS — Z8739 Personal history of other diseases of the musculoskeletal system and connective tissue: Secondary | ICD-10-CM | POA: Diagnosis not present

## 2018-08-30 DIAGNOSIS — Z8719 Personal history of other diseases of the digestive system: Secondary | ICD-10-CM | POA: Diagnosis not present

## 2018-08-30 DIAGNOSIS — Z8679 Personal history of other diseases of the circulatory system: Secondary | ICD-10-CM | POA: Diagnosis not present

## 2018-08-30 DIAGNOSIS — I509 Heart failure, unspecified: Secondary | ICD-10-CM | POA: Diagnosis not present

## 2018-08-31 DIAGNOSIS — Z8679 Personal history of other diseases of the circulatory system: Secondary | ICD-10-CM | POA: Diagnosis not present

## 2018-08-31 DIAGNOSIS — I509 Heart failure, unspecified: Secondary | ICD-10-CM | POA: Diagnosis not present

## 2018-08-31 DIAGNOSIS — Z853 Personal history of malignant neoplasm of breast: Secondary | ICD-10-CM | POA: Diagnosis not present

## 2018-08-31 DIAGNOSIS — I110 Hypertensive heart disease with heart failure: Secondary | ICD-10-CM | POA: Diagnosis not present

## 2018-08-31 DIAGNOSIS — R627 Adult failure to thrive: Secondary | ICD-10-CM | POA: Diagnosis not present

## 2018-08-31 DIAGNOSIS — Z8739 Personal history of other diseases of the musculoskeletal system and connective tissue: Secondary | ICD-10-CM | POA: Diagnosis not present

## 2018-09-05 DIAGNOSIS — I509 Heart failure, unspecified: Secondary | ICD-10-CM | POA: Diagnosis not present

## 2018-09-05 DIAGNOSIS — R627 Adult failure to thrive: Secondary | ICD-10-CM | POA: Diagnosis not present

## 2018-09-05 DIAGNOSIS — I110 Hypertensive heart disease with heart failure: Secondary | ICD-10-CM | POA: Diagnosis not present

## 2018-09-05 DIAGNOSIS — Z853 Personal history of malignant neoplasm of breast: Secondary | ICD-10-CM | POA: Diagnosis not present

## 2018-09-05 DIAGNOSIS — Z8739 Personal history of other diseases of the musculoskeletal system and connective tissue: Secondary | ICD-10-CM | POA: Diagnosis not present

## 2018-09-05 DIAGNOSIS — Z8679 Personal history of other diseases of the circulatory system: Secondary | ICD-10-CM | POA: Diagnosis not present

## 2018-09-08 DIAGNOSIS — Z853 Personal history of malignant neoplasm of breast: Secondary | ICD-10-CM | POA: Diagnosis not present

## 2018-09-08 DIAGNOSIS — Z8679 Personal history of other diseases of the circulatory system: Secondary | ICD-10-CM | POA: Diagnosis not present

## 2018-09-08 DIAGNOSIS — R627 Adult failure to thrive: Secondary | ICD-10-CM | POA: Diagnosis not present

## 2018-09-08 DIAGNOSIS — I509 Heart failure, unspecified: Secondary | ICD-10-CM | POA: Diagnosis not present

## 2018-09-08 DIAGNOSIS — I110 Hypertensive heart disease with heart failure: Secondary | ICD-10-CM | POA: Diagnosis not present

## 2018-09-08 DIAGNOSIS — Z8739 Personal history of other diseases of the musculoskeletal system and connective tissue: Secondary | ICD-10-CM | POA: Diagnosis not present

## 2018-09-12 DIAGNOSIS — Z8679 Personal history of other diseases of the circulatory system: Secondary | ICD-10-CM | POA: Diagnosis not present

## 2018-09-12 DIAGNOSIS — R627 Adult failure to thrive: Secondary | ICD-10-CM | POA: Diagnosis not present

## 2018-09-12 DIAGNOSIS — Z853 Personal history of malignant neoplasm of breast: Secondary | ICD-10-CM | POA: Diagnosis not present

## 2018-09-12 DIAGNOSIS — I110 Hypertensive heart disease with heart failure: Secondary | ICD-10-CM | POA: Diagnosis not present

## 2018-09-12 DIAGNOSIS — Z8739 Personal history of other diseases of the musculoskeletal system and connective tissue: Secondary | ICD-10-CM | POA: Diagnosis not present

## 2018-09-12 DIAGNOSIS — I509 Heart failure, unspecified: Secondary | ICD-10-CM | POA: Diagnosis not present

## 2018-09-14 DIAGNOSIS — R627 Adult failure to thrive: Secondary | ICD-10-CM | POA: Diagnosis not present

## 2018-09-14 DIAGNOSIS — I110 Hypertensive heart disease with heart failure: Secondary | ICD-10-CM | POA: Diagnosis not present

## 2018-09-14 DIAGNOSIS — Z853 Personal history of malignant neoplasm of breast: Secondary | ICD-10-CM | POA: Diagnosis not present

## 2018-09-14 DIAGNOSIS — Z8739 Personal history of other diseases of the musculoskeletal system and connective tissue: Secondary | ICD-10-CM | POA: Diagnosis not present

## 2018-09-14 DIAGNOSIS — Z8679 Personal history of other diseases of the circulatory system: Secondary | ICD-10-CM | POA: Diagnosis not present

## 2018-09-14 DIAGNOSIS — I509 Heart failure, unspecified: Secondary | ICD-10-CM | POA: Diagnosis not present

## 2018-09-18 DIAGNOSIS — Z853 Personal history of malignant neoplasm of breast: Secondary | ICD-10-CM | POA: Diagnosis not present

## 2018-09-18 DIAGNOSIS — I110 Hypertensive heart disease with heart failure: Secondary | ICD-10-CM | POA: Diagnosis not present

## 2018-09-18 DIAGNOSIS — R627 Adult failure to thrive: Secondary | ICD-10-CM | POA: Diagnosis not present

## 2018-09-18 DIAGNOSIS — Z8739 Personal history of other diseases of the musculoskeletal system and connective tissue: Secondary | ICD-10-CM | POA: Diagnosis not present

## 2018-09-18 DIAGNOSIS — Z8679 Personal history of other diseases of the circulatory system: Secondary | ICD-10-CM | POA: Diagnosis not present

## 2018-09-18 DIAGNOSIS — I509 Heart failure, unspecified: Secondary | ICD-10-CM | POA: Diagnosis not present

## 2018-09-21 DIAGNOSIS — R627 Adult failure to thrive: Secondary | ICD-10-CM | POA: Diagnosis not present

## 2018-09-21 DIAGNOSIS — I110 Hypertensive heart disease with heart failure: Secondary | ICD-10-CM | POA: Diagnosis not present

## 2018-09-21 DIAGNOSIS — Z8739 Personal history of other diseases of the musculoskeletal system and connective tissue: Secondary | ICD-10-CM | POA: Diagnosis not present

## 2018-09-21 DIAGNOSIS — Z853 Personal history of malignant neoplasm of breast: Secondary | ICD-10-CM | POA: Diagnosis not present

## 2018-09-21 DIAGNOSIS — Z8679 Personal history of other diseases of the circulatory system: Secondary | ICD-10-CM | POA: Diagnosis not present

## 2018-09-21 DIAGNOSIS — I509 Heart failure, unspecified: Secondary | ICD-10-CM | POA: Diagnosis not present

## 2018-09-25 DIAGNOSIS — Z853 Personal history of malignant neoplasm of breast: Secondary | ICD-10-CM | POA: Diagnosis not present

## 2018-09-25 DIAGNOSIS — I509 Heart failure, unspecified: Secondary | ICD-10-CM | POA: Diagnosis not present

## 2018-09-25 DIAGNOSIS — Z8739 Personal history of other diseases of the musculoskeletal system and connective tissue: Secondary | ICD-10-CM | POA: Diagnosis not present

## 2018-09-25 DIAGNOSIS — Z8679 Personal history of other diseases of the circulatory system: Secondary | ICD-10-CM | POA: Diagnosis not present

## 2018-09-25 DIAGNOSIS — R627 Adult failure to thrive: Secondary | ICD-10-CM | POA: Diagnosis not present

## 2018-09-25 DIAGNOSIS — I110 Hypertensive heart disease with heart failure: Secondary | ICD-10-CM | POA: Diagnosis not present

## 2018-09-26 ENCOUNTER — Other Ambulatory Visit: Payer: Self-pay

## 2018-09-28 DIAGNOSIS — Z8679 Personal history of other diseases of the circulatory system: Secondary | ICD-10-CM | POA: Diagnosis not present

## 2018-09-28 DIAGNOSIS — I509 Heart failure, unspecified: Secondary | ICD-10-CM | POA: Diagnosis not present

## 2018-09-28 DIAGNOSIS — Z853 Personal history of malignant neoplasm of breast: Secondary | ICD-10-CM | POA: Diagnosis not present

## 2018-09-28 DIAGNOSIS — Z8739 Personal history of other diseases of the musculoskeletal system and connective tissue: Secondary | ICD-10-CM | POA: Diagnosis not present

## 2018-09-28 DIAGNOSIS — R627 Adult failure to thrive: Secondary | ICD-10-CM | POA: Diagnosis not present

## 2018-09-28 DIAGNOSIS — I110 Hypertensive heart disease with heart failure: Secondary | ICD-10-CM | POA: Diagnosis not present

## 2018-09-29 DIAGNOSIS — Z8719 Personal history of other diseases of the digestive system: Secondary | ICD-10-CM | POA: Diagnosis not present

## 2018-09-29 DIAGNOSIS — R627 Adult failure to thrive: Secondary | ICD-10-CM | POA: Diagnosis not present

## 2018-09-29 DIAGNOSIS — I509 Heart failure, unspecified: Secondary | ICD-10-CM | POA: Diagnosis not present

## 2018-09-29 DIAGNOSIS — Z8639 Personal history of other endocrine, nutritional and metabolic disease: Secondary | ICD-10-CM | POA: Diagnosis not present

## 2018-09-29 DIAGNOSIS — I110 Hypertensive heart disease with heart failure: Secondary | ICD-10-CM | POA: Diagnosis not present

## 2018-09-29 DIAGNOSIS — Z8679 Personal history of other diseases of the circulatory system: Secondary | ICD-10-CM | POA: Diagnosis not present

## 2018-09-29 DIAGNOSIS — Z8739 Personal history of other diseases of the musculoskeletal system and connective tissue: Secondary | ICD-10-CM | POA: Diagnosis not present

## 2018-09-29 DIAGNOSIS — Z853 Personal history of malignant neoplasm of breast: Secondary | ICD-10-CM | POA: Diagnosis not present

## 2018-09-29 DIAGNOSIS — Z87440 Personal history of urinary (tract) infections: Secondary | ICD-10-CM | POA: Diagnosis not present

## 2018-10-02 DIAGNOSIS — Z8679 Personal history of other diseases of the circulatory system: Secondary | ICD-10-CM | POA: Diagnosis not present

## 2018-10-02 DIAGNOSIS — Z853 Personal history of malignant neoplasm of breast: Secondary | ICD-10-CM | POA: Diagnosis not present

## 2018-10-02 DIAGNOSIS — I509 Heart failure, unspecified: Secondary | ICD-10-CM | POA: Diagnosis not present

## 2018-10-02 DIAGNOSIS — Z8739 Personal history of other diseases of the musculoskeletal system and connective tissue: Secondary | ICD-10-CM | POA: Diagnosis not present

## 2018-10-02 DIAGNOSIS — R627 Adult failure to thrive: Secondary | ICD-10-CM | POA: Diagnosis not present

## 2018-10-02 DIAGNOSIS — I110 Hypertensive heart disease with heart failure: Secondary | ICD-10-CM | POA: Diagnosis not present

## 2018-10-05 DIAGNOSIS — I110 Hypertensive heart disease with heart failure: Secondary | ICD-10-CM | POA: Diagnosis not present

## 2018-10-05 DIAGNOSIS — I509 Heart failure, unspecified: Secondary | ICD-10-CM | POA: Diagnosis not present

## 2018-10-05 DIAGNOSIS — Z8679 Personal history of other diseases of the circulatory system: Secondary | ICD-10-CM | POA: Diagnosis not present

## 2018-10-05 DIAGNOSIS — Z853 Personal history of malignant neoplasm of breast: Secondary | ICD-10-CM | POA: Diagnosis not present

## 2018-10-05 DIAGNOSIS — R627 Adult failure to thrive: Secondary | ICD-10-CM | POA: Diagnosis not present

## 2018-10-05 DIAGNOSIS — Z8739 Personal history of other diseases of the musculoskeletal system and connective tissue: Secondary | ICD-10-CM | POA: Diagnosis not present

## 2018-10-09 DIAGNOSIS — Z853 Personal history of malignant neoplasm of breast: Secondary | ICD-10-CM | POA: Diagnosis not present

## 2018-10-09 DIAGNOSIS — Z8679 Personal history of other diseases of the circulatory system: Secondary | ICD-10-CM | POA: Diagnosis not present

## 2018-10-09 DIAGNOSIS — I509 Heart failure, unspecified: Secondary | ICD-10-CM | POA: Diagnosis not present

## 2018-10-09 DIAGNOSIS — I110 Hypertensive heart disease with heart failure: Secondary | ICD-10-CM | POA: Diagnosis not present

## 2018-10-09 DIAGNOSIS — R627 Adult failure to thrive: Secondary | ICD-10-CM | POA: Diagnosis not present

## 2018-10-09 DIAGNOSIS — Z8739 Personal history of other diseases of the musculoskeletal system and connective tissue: Secondary | ICD-10-CM | POA: Diagnosis not present

## 2018-10-10 DIAGNOSIS — Z8679 Personal history of other diseases of the circulatory system: Secondary | ICD-10-CM | POA: Diagnosis not present

## 2018-10-10 DIAGNOSIS — Z853 Personal history of malignant neoplasm of breast: Secondary | ICD-10-CM | POA: Diagnosis not present

## 2018-10-10 DIAGNOSIS — I110 Hypertensive heart disease with heart failure: Secondary | ICD-10-CM | POA: Diagnosis not present

## 2018-10-10 DIAGNOSIS — Z8739 Personal history of other diseases of the musculoskeletal system and connective tissue: Secondary | ICD-10-CM | POA: Diagnosis not present

## 2018-10-10 DIAGNOSIS — I509 Heart failure, unspecified: Secondary | ICD-10-CM | POA: Diagnosis not present

## 2018-10-10 DIAGNOSIS — R627 Adult failure to thrive: Secondary | ICD-10-CM | POA: Diagnosis not present

## 2018-10-12 DIAGNOSIS — Z853 Personal history of malignant neoplasm of breast: Secondary | ICD-10-CM | POA: Diagnosis not present

## 2018-10-12 DIAGNOSIS — R627 Adult failure to thrive: Secondary | ICD-10-CM | POA: Diagnosis not present

## 2018-10-12 DIAGNOSIS — I509 Heart failure, unspecified: Secondary | ICD-10-CM | POA: Diagnosis not present

## 2018-10-12 DIAGNOSIS — Z8739 Personal history of other diseases of the musculoskeletal system and connective tissue: Secondary | ICD-10-CM | POA: Diagnosis not present

## 2018-10-12 DIAGNOSIS — Z8679 Personal history of other diseases of the circulatory system: Secondary | ICD-10-CM | POA: Diagnosis not present

## 2018-10-12 DIAGNOSIS — I110 Hypertensive heart disease with heart failure: Secondary | ICD-10-CM | POA: Diagnosis not present

## 2018-10-16 DIAGNOSIS — Z853 Personal history of malignant neoplasm of breast: Secondary | ICD-10-CM | POA: Diagnosis not present

## 2018-10-16 DIAGNOSIS — R627 Adult failure to thrive: Secondary | ICD-10-CM | POA: Diagnosis not present

## 2018-10-16 DIAGNOSIS — I110 Hypertensive heart disease with heart failure: Secondary | ICD-10-CM | POA: Diagnosis not present

## 2018-10-16 DIAGNOSIS — Z8679 Personal history of other diseases of the circulatory system: Secondary | ICD-10-CM | POA: Diagnosis not present

## 2018-10-16 DIAGNOSIS — I509 Heart failure, unspecified: Secondary | ICD-10-CM | POA: Diagnosis not present

## 2018-10-16 DIAGNOSIS — Z8739 Personal history of other diseases of the musculoskeletal system and connective tissue: Secondary | ICD-10-CM | POA: Diagnosis not present

## 2018-10-19 DIAGNOSIS — Z8679 Personal history of other diseases of the circulatory system: Secondary | ICD-10-CM | POA: Diagnosis not present

## 2018-10-19 DIAGNOSIS — R627 Adult failure to thrive: Secondary | ICD-10-CM | POA: Diagnosis not present

## 2018-10-19 DIAGNOSIS — Z8739 Personal history of other diseases of the musculoskeletal system and connective tissue: Secondary | ICD-10-CM | POA: Diagnosis not present

## 2018-10-19 DIAGNOSIS — I509 Heart failure, unspecified: Secondary | ICD-10-CM | POA: Diagnosis not present

## 2018-10-19 DIAGNOSIS — I110 Hypertensive heart disease with heart failure: Secondary | ICD-10-CM | POA: Diagnosis not present

## 2018-10-19 DIAGNOSIS — Z853 Personal history of malignant neoplasm of breast: Secondary | ICD-10-CM | POA: Diagnosis not present

## 2018-10-23 DIAGNOSIS — R627 Adult failure to thrive: Secondary | ICD-10-CM | POA: Diagnosis not present

## 2018-10-23 DIAGNOSIS — Z8679 Personal history of other diseases of the circulatory system: Secondary | ICD-10-CM | POA: Diagnosis not present

## 2018-10-23 DIAGNOSIS — I509 Heart failure, unspecified: Secondary | ICD-10-CM | POA: Diagnosis not present

## 2018-10-23 DIAGNOSIS — Z853 Personal history of malignant neoplasm of breast: Secondary | ICD-10-CM | POA: Diagnosis not present

## 2018-10-23 DIAGNOSIS — I110 Hypertensive heart disease with heart failure: Secondary | ICD-10-CM | POA: Diagnosis not present

## 2018-10-23 DIAGNOSIS — Z8739 Personal history of other diseases of the musculoskeletal system and connective tissue: Secondary | ICD-10-CM | POA: Diagnosis not present

## 2018-10-25 DIAGNOSIS — Z8739 Personal history of other diseases of the musculoskeletal system and connective tissue: Secondary | ICD-10-CM | POA: Diagnosis not present

## 2018-10-25 DIAGNOSIS — R627 Adult failure to thrive: Secondary | ICD-10-CM | POA: Diagnosis not present

## 2018-10-25 DIAGNOSIS — I509 Heart failure, unspecified: Secondary | ICD-10-CM | POA: Diagnosis not present

## 2018-10-25 DIAGNOSIS — I110 Hypertensive heart disease with heart failure: Secondary | ICD-10-CM | POA: Diagnosis not present

## 2018-10-25 DIAGNOSIS — Z853 Personal history of malignant neoplasm of breast: Secondary | ICD-10-CM | POA: Diagnosis not present

## 2018-10-25 DIAGNOSIS — Z8679 Personal history of other diseases of the circulatory system: Secondary | ICD-10-CM | POA: Diagnosis not present

## 2018-10-26 DIAGNOSIS — R627 Adult failure to thrive: Secondary | ICD-10-CM | POA: Diagnosis not present

## 2018-10-26 DIAGNOSIS — I509 Heart failure, unspecified: Secondary | ICD-10-CM | POA: Diagnosis not present

## 2018-10-26 DIAGNOSIS — Z8739 Personal history of other diseases of the musculoskeletal system and connective tissue: Secondary | ICD-10-CM | POA: Diagnosis not present

## 2018-10-26 DIAGNOSIS — I110 Hypertensive heart disease with heart failure: Secondary | ICD-10-CM | POA: Diagnosis not present

## 2018-10-26 DIAGNOSIS — Z8679 Personal history of other diseases of the circulatory system: Secondary | ICD-10-CM | POA: Diagnosis not present

## 2018-10-26 DIAGNOSIS — Z853 Personal history of malignant neoplasm of breast: Secondary | ICD-10-CM | POA: Diagnosis not present

## 2018-10-30 DIAGNOSIS — G8929 Other chronic pain: Secondary | ICD-10-CM | POA: Diagnosis not present

## 2018-10-30 DIAGNOSIS — Z87440 Personal history of urinary (tract) infections: Secondary | ICD-10-CM | POA: Diagnosis not present

## 2018-10-30 DIAGNOSIS — M1990 Unspecified osteoarthritis, unspecified site: Secondary | ICD-10-CM | POA: Diagnosis not present

## 2018-10-30 DIAGNOSIS — K5900 Constipation, unspecified: Secondary | ICD-10-CM | POA: Diagnosis not present

## 2018-10-30 DIAGNOSIS — Z853 Personal history of malignant neoplasm of breast: Secondary | ICD-10-CM | POA: Diagnosis not present

## 2018-10-30 DIAGNOSIS — I509 Heart failure, unspecified: Secondary | ICD-10-CM | POA: Diagnosis not present

## 2018-10-30 DIAGNOSIS — M549 Dorsalgia, unspecified: Secondary | ICD-10-CM | POA: Diagnosis not present

## 2018-10-30 DIAGNOSIS — E039 Hypothyroidism, unspecified: Secondary | ICD-10-CM | POA: Diagnosis not present

## 2018-10-30 DIAGNOSIS — R627 Adult failure to thrive: Secondary | ICD-10-CM | POA: Diagnosis not present

## 2018-10-30 DIAGNOSIS — I110 Hypertensive heart disease with heart failure: Secondary | ICD-10-CM | POA: Diagnosis not present

## 2018-10-31 DIAGNOSIS — Z853 Personal history of malignant neoplasm of breast: Secondary | ICD-10-CM | POA: Diagnosis not present

## 2018-10-31 DIAGNOSIS — I509 Heart failure, unspecified: Secondary | ICD-10-CM | POA: Diagnosis not present

## 2018-10-31 DIAGNOSIS — G8929 Other chronic pain: Secondary | ICD-10-CM | POA: Diagnosis not present

## 2018-10-31 DIAGNOSIS — M1990 Unspecified osteoarthritis, unspecified site: Secondary | ICD-10-CM | POA: Diagnosis not present

## 2018-10-31 DIAGNOSIS — I110 Hypertensive heart disease with heart failure: Secondary | ICD-10-CM | POA: Diagnosis not present

## 2018-10-31 DIAGNOSIS — R627 Adult failure to thrive: Secondary | ICD-10-CM | POA: Diagnosis not present

## 2018-11-02 DIAGNOSIS — R627 Adult failure to thrive: Secondary | ICD-10-CM | POA: Diagnosis not present

## 2018-11-02 DIAGNOSIS — I110 Hypertensive heart disease with heart failure: Secondary | ICD-10-CM | POA: Diagnosis not present

## 2018-11-02 DIAGNOSIS — M1990 Unspecified osteoarthritis, unspecified site: Secondary | ICD-10-CM | POA: Diagnosis not present

## 2018-11-02 DIAGNOSIS — G8929 Other chronic pain: Secondary | ICD-10-CM | POA: Diagnosis not present

## 2018-11-02 DIAGNOSIS — Z853 Personal history of malignant neoplasm of breast: Secondary | ICD-10-CM | POA: Diagnosis not present

## 2018-11-02 DIAGNOSIS — I509 Heart failure, unspecified: Secondary | ICD-10-CM | POA: Diagnosis not present

## 2018-11-06 DIAGNOSIS — I110 Hypertensive heart disease with heart failure: Secondary | ICD-10-CM | POA: Diagnosis not present

## 2018-11-06 DIAGNOSIS — R627 Adult failure to thrive: Secondary | ICD-10-CM | POA: Diagnosis not present

## 2018-11-06 DIAGNOSIS — I509 Heart failure, unspecified: Secondary | ICD-10-CM | POA: Diagnosis not present

## 2018-11-06 DIAGNOSIS — M1990 Unspecified osteoarthritis, unspecified site: Secondary | ICD-10-CM | POA: Diagnosis not present

## 2018-11-06 DIAGNOSIS — G8929 Other chronic pain: Secondary | ICD-10-CM | POA: Diagnosis not present

## 2018-11-06 DIAGNOSIS — Z853 Personal history of malignant neoplasm of breast: Secondary | ICD-10-CM | POA: Diagnosis not present

## 2018-11-07 DIAGNOSIS — R627 Adult failure to thrive: Secondary | ICD-10-CM | POA: Diagnosis not present

## 2018-11-07 DIAGNOSIS — I110 Hypertensive heart disease with heart failure: Secondary | ICD-10-CM | POA: Diagnosis not present

## 2018-11-07 DIAGNOSIS — M1990 Unspecified osteoarthritis, unspecified site: Secondary | ICD-10-CM | POA: Diagnosis not present

## 2018-11-07 DIAGNOSIS — Z853 Personal history of malignant neoplasm of breast: Secondary | ICD-10-CM | POA: Diagnosis not present

## 2018-11-07 DIAGNOSIS — I509 Heart failure, unspecified: Secondary | ICD-10-CM | POA: Diagnosis not present

## 2018-11-07 DIAGNOSIS — G8929 Other chronic pain: Secondary | ICD-10-CM | POA: Diagnosis not present

## 2018-11-08 DIAGNOSIS — I110 Hypertensive heart disease with heart failure: Secondary | ICD-10-CM | POA: Diagnosis not present

## 2018-11-08 DIAGNOSIS — R627 Adult failure to thrive: Secondary | ICD-10-CM | POA: Diagnosis not present

## 2018-11-08 DIAGNOSIS — I509 Heart failure, unspecified: Secondary | ICD-10-CM | POA: Diagnosis not present

## 2018-11-08 DIAGNOSIS — G8929 Other chronic pain: Secondary | ICD-10-CM | POA: Diagnosis not present

## 2018-11-08 DIAGNOSIS — M1990 Unspecified osteoarthritis, unspecified site: Secondary | ICD-10-CM | POA: Diagnosis not present

## 2018-11-08 DIAGNOSIS — Z853 Personal history of malignant neoplasm of breast: Secondary | ICD-10-CM | POA: Diagnosis not present

## 2018-11-09 DIAGNOSIS — R627 Adult failure to thrive: Secondary | ICD-10-CM | POA: Diagnosis not present

## 2018-11-09 DIAGNOSIS — M1990 Unspecified osteoarthritis, unspecified site: Secondary | ICD-10-CM | POA: Diagnosis not present

## 2018-11-09 DIAGNOSIS — G8929 Other chronic pain: Secondary | ICD-10-CM | POA: Diagnosis not present

## 2018-11-09 DIAGNOSIS — I110 Hypertensive heart disease with heart failure: Secondary | ICD-10-CM | POA: Diagnosis not present

## 2018-11-09 DIAGNOSIS — Z853 Personal history of malignant neoplasm of breast: Secondary | ICD-10-CM | POA: Diagnosis not present

## 2018-11-09 DIAGNOSIS — I509 Heart failure, unspecified: Secondary | ICD-10-CM | POA: Diagnosis not present

## 2018-11-13 DIAGNOSIS — I110 Hypertensive heart disease with heart failure: Secondary | ICD-10-CM | POA: Diagnosis not present

## 2018-11-13 DIAGNOSIS — M1990 Unspecified osteoarthritis, unspecified site: Secondary | ICD-10-CM | POA: Diagnosis not present

## 2018-11-13 DIAGNOSIS — I509 Heart failure, unspecified: Secondary | ICD-10-CM | POA: Diagnosis not present

## 2018-11-13 DIAGNOSIS — G8929 Other chronic pain: Secondary | ICD-10-CM | POA: Diagnosis not present

## 2018-11-13 DIAGNOSIS — R627 Adult failure to thrive: Secondary | ICD-10-CM | POA: Diagnosis not present

## 2018-11-13 DIAGNOSIS — Z853 Personal history of malignant neoplasm of breast: Secondary | ICD-10-CM | POA: Diagnosis not present

## 2018-11-14 DIAGNOSIS — I110 Hypertensive heart disease with heart failure: Secondary | ICD-10-CM | POA: Diagnosis not present

## 2018-11-14 DIAGNOSIS — G8929 Other chronic pain: Secondary | ICD-10-CM | POA: Diagnosis not present

## 2018-11-14 DIAGNOSIS — Z853 Personal history of malignant neoplasm of breast: Secondary | ICD-10-CM | POA: Diagnosis not present

## 2018-11-14 DIAGNOSIS — M1990 Unspecified osteoarthritis, unspecified site: Secondary | ICD-10-CM | POA: Diagnosis not present

## 2018-11-14 DIAGNOSIS — R627 Adult failure to thrive: Secondary | ICD-10-CM | POA: Diagnosis not present

## 2018-11-14 DIAGNOSIS — I509 Heart failure, unspecified: Secondary | ICD-10-CM | POA: Diagnosis not present

## 2018-11-16 DIAGNOSIS — M1990 Unspecified osteoarthritis, unspecified site: Secondary | ICD-10-CM | POA: Diagnosis not present

## 2018-11-16 DIAGNOSIS — Z853 Personal history of malignant neoplasm of breast: Secondary | ICD-10-CM | POA: Diagnosis not present

## 2018-11-16 DIAGNOSIS — R627 Adult failure to thrive: Secondary | ICD-10-CM | POA: Diagnosis not present

## 2018-11-16 DIAGNOSIS — G8929 Other chronic pain: Secondary | ICD-10-CM | POA: Diagnosis not present

## 2018-11-16 DIAGNOSIS — I110 Hypertensive heart disease with heart failure: Secondary | ICD-10-CM | POA: Diagnosis not present

## 2018-11-16 DIAGNOSIS — I509 Heart failure, unspecified: Secondary | ICD-10-CM | POA: Diagnosis not present

## 2018-11-20 DIAGNOSIS — G8929 Other chronic pain: Secondary | ICD-10-CM | POA: Diagnosis not present

## 2018-11-20 DIAGNOSIS — R627 Adult failure to thrive: Secondary | ICD-10-CM | POA: Diagnosis not present

## 2018-11-20 DIAGNOSIS — M1990 Unspecified osteoarthritis, unspecified site: Secondary | ICD-10-CM | POA: Diagnosis not present

## 2018-11-20 DIAGNOSIS — Z853 Personal history of malignant neoplasm of breast: Secondary | ICD-10-CM | POA: Diagnosis not present

## 2018-11-20 DIAGNOSIS — I110 Hypertensive heart disease with heart failure: Secondary | ICD-10-CM | POA: Diagnosis not present

## 2018-11-20 DIAGNOSIS — I509 Heart failure, unspecified: Secondary | ICD-10-CM | POA: Diagnosis not present

## 2018-11-21 DIAGNOSIS — I509 Heart failure, unspecified: Secondary | ICD-10-CM | POA: Diagnosis not present

## 2018-11-21 DIAGNOSIS — M1990 Unspecified osteoarthritis, unspecified site: Secondary | ICD-10-CM | POA: Diagnosis not present

## 2018-11-21 DIAGNOSIS — Z853 Personal history of malignant neoplasm of breast: Secondary | ICD-10-CM | POA: Diagnosis not present

## 2018-11-21 DIAGNOSIS — I110 Hypertensive heart disease with heart failure: Secondary | ICD-10-CM | POA: Diagnosis not present

## 2018-11-21 DIAGNOSIS — R627 Adult failure to thrive: Secondary | ICD-10-CM | POA: Diagnosis not present

## 2018-11-21 DIAGNOSIS — G8929 Other chronic pain: Secondary | ICD-10-CM | POA: Diagnosis not present

## 2018-11-23 DIAGNOSIS — R627 Adult failure to thrive: Secondary | ICD-10-CM | POA: Diagnosis not present

## 2018-11-23 DIAGNOSIS — M1990 Unspecified osteoarthritis, unspecified site: Secondary | ICD-10-CM | POA: Diagnosis not present

## 2018-11-23 DIAGNOSIS — I110 Hypertensive heart disease with heart failure: Secondary | ICD-10-CM | POA: Diagnosis not present

## 2018-11-23 DIAGNOSIS — I509 Heart failure, unspecified: Secondary | ICD-10-CM | POA: Diagnosis not present

## 2018-11-23 DIAGNOSIS — G8929 Other chronic pain: Secondary | ICD-10-CM | POA: Diagnosis not present

## 2018-11-23 DIAGNOSIS — Z853 Personal history of malignant neoplasm of breast: Secondary | ICD-10-CM | POA: Diagnosis not present

## 2018-11-27 DIAGNOSIS — I509 Heart failure, unspecified: Secondary | ICD-10-CM | POA: Diagnosis not present

## 2018-11-27 DIAGNOSIS — I110 Hypertensive heart disease with heart failure: Secondary | ICD-10-CM | POA: Diagnosis not present

## 2018-11-27 DIAGNOSIS — M1990 Unspecified osteoarthritis, unspecified site: Secondary | ICD-10-CM | POA: Diagnosis not present

## 2018-11-27 DIAGNOSIS — G8929 Other chronic pain: Secondary | ICD-10-CM | POA: Diagnosis not present

## 2018-11-27 DIAGNOSIS — Z853 Personal history of malignant neoplasm of breast: Secondary | ICD-10-CM | POA: Diagnosis not present

## 2018-11-27 DIAGNOSIS — R627 Adult failure to thrive: Secondary | ICD-10-CM | POA: Diagnosis not present

## 2018-11-28 DIAGNOSIS — R627 Adult failure to thrive: Secondary | ICD-10-CM | POA: Diagnosis not present

## 2018-11-28 DIAGNOSIS — G8929 Other chronic pain: Secondary | ICD-10-CM | POA: Diagnosis not present

## 2018-11-28 DIAGNOSIS — M1990 Unspecified osteoarthritis, unspecified site: Secondary | ICD-10-CM | POA: Diagnosis not present

## 2018-11-28 DIAGNOSIS — Z853 Personal history of malignant neoplasm of breast: Secondary | ICD-10-CM | POA: Diagnosis not present

## 2018-11-28 DIAGNOSIS — I110 Hypertensive heart disease with heart failure: Secondary | ICD-10-CM | POA: Diagnosis not present

## 2018-11-28 DIAGNOSIS — I509 Heart failure, unspecified: Secondary | ICD-10-CM | POA: Diagnosis not present

## 2018-11-29 DIAGNOSIS — Z853 Personal history of malignant neoplasm of breast: Secondary | ICD-10-CM | POA: Diagnosis not present

## 2018-11-29 DIAGNOSIS — I110 Hypertensive heart disease with heart failure: Secondary | ICD-10-CM | POA: Diagnosis not present

## 2018-11-29 DIAGNOSIS — I509 Heart failure, unspecified: Secondary | ICD-10-CM | POA: Diagnosis not present

## 2018-11-29 DIAGNOSIS — M549 Dorsalgia, unspecified: Secondary | ICD-10-CM | POA: Diagnosis not present

## 2018-11-29 DIAGNOSIS — M1990 Unspecified osteoarthritis, unspecified site: Secondary | ICD-10-CM | POA: Diagnosis not present

## 2018-11-29 DIAGNOSIS — Z87440 Personal history of urinary (tract) infections: Secondary | ICD-10-CM | POA: Diagnosis not present

## 2018-11-29 DIAGNOSIS — E039 Hypothyroidism, unspecified: Secondary | ICD-10-CM | POA: Diagnosis not present

## 2018-11-29 DIAGNOSIS — K5900 Constipation, unspecified: Secondary | ICD-10-CM | POA: Diagnosis not present

## 2018-11-29 DIAGNOSIS — R627 Adult failure to thrive: Secondary | ICD-10-CM | POA: Diagnosis not present

## 2018-11-29 DIAGNOSIS — G8929 Other chronic pain: Secondary | ICD-10-CM | POA: Diagnosis not present

## 2018-11-30 DIAGNOSIS — G8929 Other chronic pain: Secondary | ICD-10-CM | POA: Diagnosis not present

## 2018-11-30 DIAGNOSIS — Z853 Personal history of malignant neoplasm of breast: Secondary | ICD-10-CM | POA: Diagnosis not present

## 2018-11-30 DIAGNOSIS — I110 Hypertensive heart disease with heart failure: Secondary | ICD-10-CM | POA: Diagnosis not present

## 2018-11-30 DIAGNOSIS — M1990 Unspecified osteoarthritis, unspecified site: Secondary | ICD-10-CM | POA: Diagnosis not present

## 2018-11-30 DIAGNOSIS — R627 Adult failure to thrive: Secondary | ICD-10-CM | POA: Diagnosis not present

## 2018-11-30 DIAGNOSIS — I509 Heart failure, unspecified: Secondary | ICD-10-CM | POA: Diagnosis not present

## 2018-12-04 DIAGNOSIS — Z853 Personal history of malignant neoplasm of breast: Secondary | ICD-10-CM | POA: Diagnosis not present

## 2018-12-04 DIAGNOSIS — M1990 Unspecified osteoarthritis, unspecified site: Secondary | ICD-10-CM | POA: Diagnosis not present

## 2018-12-04 DIAGNOSIS — I509 Heart failure, unspecified: Secondary | ICD-10-CM | POA: Diagnosis not present

## 2018-12-04 DIAGNOSIS — G8929 Other chronic pain: Secondary | ICD-10-CM | POA: Diagnosis not present

## 2018-12-04 DIAGNOSIS — R627 Adult failure to thrive: Secondary | ICD-10-CM | POA: Diagnosis not present

## 2018-12-04 DIAGNOSIS — I110 Hypertensive heart disease with heart failure: Secondary | ICD-10-CM | POA: Diagnosis not present

## 2018-12-05 DIAGNOSIS — M1990 Unspecified osteoarthritis, unspecified site: Secondary | ICD-10-CM | POA: Diagnosis not present

## 2018-12-05 DIAGNOSIS — I110 Hypertensive heart disease with heart failure: Secondary | ICD-10-CM | POA: Diagnosis not present

## 2018-12-05 DIAGNOSIS — R627 Adult failure to thrive: Secondary | ICD-10-CM | POA: Diagnosis not present

## 2018-12-05 DIAGNOSIS — G8929 Other chronic pain: Secondary | ICD-10-CM | POA: Diagnosis not present

## 2018-12-05 DIAGNOSIS — I509 Heart failure, unspecified: Secondary | ICD-10-CM | POA: Diagnosis not present

## 2018-12-05 DIAGNOSIS — Z853 Personal history of malignant neoplasm of breast: Secondary | ICD-10-CM | POA: Diagnosis not present

## 2018-12-07 DIAGNOSIS — R627 Adult failure to thrive: Secondary | ICD-10-CM | POA: Diagnosis not present

## 2018-12-07 DIAGNOSIS — I110 Hypertensive heart disease with heart failure: Secondary | ICD-10-CM | POA: Diagnosis not present

## 2018-12-07 DIAGNOSIS — I509 Heart failure, unspecified: Secondary | ICD-10-CM | POA: Diagnosis not present

## 2018-12-07 DIAGNOSIS — Z853 Personal history of malignant neoplasm of breast: Secondary | ICD-10-CM | POA: Diagnosis not present

## 2018-12-07 DIAGNOSIS — M1990 Unspecified osteoarthritis, unspecified site: Secondary | ICD-10-CM | POA: Diagnosis not present

## 2018-12-07 DIAGNOSIS — G8929 Other chronic pain: Secondary | ICD-10-CM | POA: Diagnosis not present

## 2018-12-11 DIAGNOSIS — R627 Adult failure to thrive: Secondary | ICD-10-CM | POA: Diagnosis not present

## 2018-12-11 DIAGNOSIS — G8929 Other chronic pain: Secondary | ICD-10-CM | POA: Diagnosis not present

## 2018-12-11 DIAGNOSIS — Z853 Personal history of malignant neoplasm of breast: Secondary | ICD-10-CM | POA: Diagnosis not present

## 2018-12-11 DIAGNOSIS — I509 Heart failure, unspecified: Secondary | ICD-10-CM | POA: Diagnosis not present

## 2018-12-11 DIAGNOSIS — M1990 Unspecified osteoarthritis, unspecified site: Secondary | ICD-10-CM | POA: Diagnosis not present

## 2018-12-11 DIAGNOSIS — I110 Hypertensive heart disease with heart failure: Secondary | ICD-10-CM | POA: Diagnosis not present

## 2018-12-12 DIAGNOSIS — I509 Heart failure, unspecified: Secondary | ICD-10-CM | POA: Diagnosis not present

## 2018-12-12 DIAGNOSIS — G8929 Other chronic pain: Secondary | ICD-10-CM | POA: Diagnosis not present

## 2018-12-12 DIAGNOSIS — R627 Adult failure to thrive: Secondary | ICD-10-CM | POA: Diagnosis not present

## 2018-12-12 DIAGNOSIS — M1990 Unspecified osteoarthritis, unspecified site: Secondary | ICD-10-CM | POA: Diagnosis not present

## 2018-12-12 DIAGNOSIS — I110 Hypertensive heart disease with heart failure: Secondary | ICD-10-CM | POA: Diagnosis not present

## 2018-12-12 DIAGNOSIS — Z853 Personal history of malignant neoplasm of breast: Secondary | ICD-10-CM | POA: Diagnosis not present

## 2018-12-14 DIAGNOSIS — I110 Hypertensive heart disease with heart failure: Secondary | ICD-10-CM | POA: Diagnosis not present

## 2018-12-14 DIAGNOSIS — Z853 Personal history of malignant neoplasm of breast: Secondary | ICD-10-CM | POA: Diagnosis not present

## 2018-12-14 DIAGNOSIS — G8929 Other chronic pain: Secondary | ICD-10-CM | POA: Diagnosis not present

## 2018-12-14 DIAGNOSIS — I509 Heart failure, unspecified: Secondary | ICD-10-CM | POA: Diagnosis not present

## 2018-12-14 DIAGNOSIS — M1990 Unspecified osteoarthritis, unspecified site: Secondary | ICD-10-CM | POA: Diagnosis not present

## 2018-12-14 DIAGNOSIS — R627 Adult failure to thrive: Secondary | ICD-10-CM | POA: Diagnosis not present

## 2018-12-18 DIAGNOSIS — M1990 Unspecified osteoarthritis, unspecified site: Secondary | ICD-10-CM | POA: Diagnosis not present

## 2018-12-18 DIAGNOSIS — I110 Hypertensive heart disease with heart failure: Secondary | ICD-10-CM | POA: Diagnosis not present

## 2018-12-18 DIAGNOSIS — Z853 Personal history of malignant neoplasm of breast: Secondary | ICD-10-CM | POA: Diagnosis not present

## 2018-12-18 DIAGNOSIS — I509 Heart failure, unspecified: Secondary | ICD-10-CM | POA: Diagnosis not present

## 2018-12-18 DIAGNOSIS — G8929 Other chronic pain: Secondary | ICD-10-CM | POA: Diagnosis not present

## 2018-12-18 DIAGNOSIS — R627 Adult failure to thrive: Secondary | ICD-10-CM | POA: Diagnosis not present

## 2018-12-19 DIAGNOSIS — R627 Adult failure to thrive: Secondary | ICD-10-CM | POA: Diagnosis not present

## 2018-12-19 DIAGNOSIS — I509 Heart failure, unspecified: Secondary | ICD-10-CM | POA: Diagnosis not present

## 2018-12-19 DIAGNOSIS — G8929 Other chronic pain: Secondary | ICD-10-CM | POA: Diagnosis not present

## 2018-12-19 DIAGNOSIS — M1990 Unspecified osteoarthritis, unspecified site: Secondary | ICD-10-CM | POA: Diagnosis not present

## 2018-12-19 DIAGNOSIS — Z853 Personal history of malignant neoplasm of breast: Secondary | ICD-10-CM | POA: Diagnosis not present

## 2018-12-19 DIAGNOSIS — I110 Hypertensive heart disease with heart failure: Secondary | ICD-10-CM | POA: Diagnosis not present

## 2018-12-21 DIAGNOSIS — I509 Heart failure, unspecified: Secondary | ICD-10-CM | POA: Diagnosis not present

## 2018-12-21 DIAGNOSIS — G8929 Other chronic pain: Secondary | ICD-10-CM | POA: Diagnosis not present

## 2018-12-21 DIAGNOSIS — R627 Adult failure to thrive: Secondary | ICD-10-CM | POA: Diagnosis not present

## 2018-12-21 DIAGNOSIS — M1990 Unspecified osteoarthritis, unspecified site: Secondary | ICD-10-CM | POA: Diagnosis not present

## 2018-12-21 DIAGNOSIS — Z853 Personal history of malignant neoplasm of breast: Secondary | ICD-10-CM | POA: Diagnosis not present

## 2018-12-21 DIAGNOSIS — I110 Hypertensive heart disease with heart failure: Secondary | ICD-10-CM | POA: Diagnosis not present

## 2018-12-25 DIAGNOSIS — I110 Hypertensive heart disease with heart failure: Secondary | ICD-10-CM | POA: Diagnosis not present

## 2018-12-25 DIAGNOSIS — M1990 Unspecified osteoarthritis, unspecified site: Secondary | ICD-10-CM | POA: Diagnosis not present

## 2018-12-25 DIAGNOSIS — Z853 Personal history of malignant neoplasm of breast: Secondary | ICD-10-CM | POA: Diagnosis not present

## 2018-12-25 DIAGNOSIS — R627 Adult failure to thrive: Secondary | ICD-10-CM | POA: Diagnosis not present

## 2018-12-25 DIAGNOSIS — I509 Heart failure, unspecified: Secondary | ICD-10-CM | POA: Diagnosis not present

## 2018-12-25 DIAGNOSIS — G8929 Other chronic pain: Secondary | ICD-10-CM | POA: Diagnosis not present

## 2018-12-28 DIAGNOSIS — Z853 Personal history of malignant neoplasm of breast: Secondary | ICD-10-CM | POA: Diagnosis not present

## 2018-12-28 DIAGNOSIS — M1990 Unspecified osteoarthritis, unspecified site: Secondary | ICD-10-CM | POA: Diagnosis not present

## 2018-12-28 DIAGNOSIS — I509 Heart failure, unspecified: Secondary | ICD-10-CM | POA: Diagnosis not present

## 2018-12-28 DIAGNOSIS — R627 Adult failure to thrive: Secondary | ICD-10-CM | POA: Diagnosis not present

## 2018-12-28 DIAGNOSIS — G8929 Other chronic pain: Secondary | ICD-10-CM | POA: Diagnosis not present

## 2018-12-28 DIAGNOSIS — I110 Hypertensive heart disease with heart failure: Secondary | ICD-10-CM | POA: Diagnosis not present

## 2018-12-30 DIAGNOSIS — Z853 Personal history of malignant neoplasm of breast: Secondary | ICD-10-CM | POA: Diagnosis not present

## 2018-12-30 DIAGNOSIS — R627 Adult failure to thrive: Secondary | ICD-10-CM | POA: Diagnosis not present

## 2018-12-30 DIAGNOSIS — M549 Dorsalgia, unspecified: Secondary | ICD-10-CM | POA: Diagnosis not present

## 2018-12-30 DIAGNOSIS — E039 Hypothyroidism, unspecified: Secondary | ICD-10-CM | POA: Diagnosis not present

## 2018-12-30 DIAGNOSIS — I110 Hypertensive heart disease with heart failure: Secondary | ICD-10-CM | POA: Diagnosis not present

## 2018-12-30 DIAGNOSIS — M1990 Unspecified osteoarthritis, unspecified site: Secondary | ICD-10-CM | POA: Diagnosis not present

## 2018-12-30 DIAGNOSIS — K5900 Constipation, unspecified: Secondary | ICD-10-CM | POA: Diagnosis not present

## 2018-12-30 DIAGNOSIS — Z87440 Personal history of urinary (tract) infections: Secondary | ICD-10-CM | POA: Diagnosis not present

## 2018-12-30 DIAGNOSIS — G8929 Other chronic pain: Secondary | ICD-10-CM | POA: Diagnosis not present

## 2018-12-30 DIAGNOSIS — I509 Heart failure, unspecified: Secondary | ICD-10-CM | POA: Diagnosis not present

## 2019-01-01 DIAGNOSIS — G8929 Other chronic pain: Secondary | ICD-10-CM | POA: Diagnosis not present

## 2019-01-01 DIAGNOSIS — R627 Adult failure to thrive: Secondary | ICD-10-CM | POA: Diagnosis not present

## 2019-01-01 DIAGNOSIS — I509 Heart failure, unspecified: Secondary | ICD-10-CM | POA: Diagnosis not present

## 2019-01-01 DIAGNOSIS — Z853 Personal history of malignant neoplasm of breast: Secondary | ICD-10-CM | POA: Diagnosis not present

## 2019-01-01 DIAGNOSIS — I110 Hypertensive heart disease with heart failure: Secondary | ICD-10-CM | POA: Diagnosis not present

## 2019-01-01 DIAGNOSIS — M1990 Unspecified osteoarthritis, unspecified site: Secondary | ICD-10-CM | POA: Diagnosis not present

## 2019-01-02 DIAGNOSIS — R627 Adult failure to thrive: Secondary | ICD-10-CM | POA: Diagnosis not present

## 2019-01-02 DIAGNOSIS — I110 Hypertensive heart disease with heart failure: Secondary | ICD-10-CM | POA: Diagnosis not present

## 2019-01-02 DIAGNOSIS — M1990 Unspecified osteoarthritis, unspecified site: Secondary | ICD-10-CM | POA: Diagnosis not present

## 2019-01-02 DIAGNOSIS — Z853 Personal history of malignant neoplasm of breast: Secondary | ICD-10-CM | POA: Diagnosis not present

## 2019-01-02 DIAGNOSIS — G8929 Other chronic pain: Secondary | ICD-10-CM | POA: Diagnosis not present

## 2019-01-02 DIAGNOSIS — I509 Heart failure, unspecified: Secondary | ICD-10-CM | POA: Diagnosis not present

## 2019-01-04 DIAGNOSIS — G8929 Other chronic pain: Secondary | ICD-10-CM | POA: Diagnosis not present

## 2019-01-04 DIAGNOSIS — R627 Adult failure to thrive: Secondary | ICD-10-CM | POA: Diagnosis not present

## 2019-01-04 DIAGNOSIS — I509 Heart failure, unspecified: Secondary | ICD-10-CM | POA: Diagnosis not present

## 2019-01-04 DIAGNOSIS — I110 Hypertensive heart disease with heart failure: Secondary | ICD-10-CM | POA: Diagnosis not present

## 2019-01-04 DIAGNOSIS — M1990 Unspecified osteoarthritis, unspecified site: Secondary | ICD-10-CM | POA: Diagnosis not present

## 2019-01-04 DIAGNOSIS — Z853 Personal history of malignant neoplasm of breast: Secondary | ICD-10-CM | POA: Diagnosis not present

## 2019-01-08 DIAGNOSIS — I509 Heart failure, unspecified: Secondary | ICD-10-CM | POA: Diagnosis not present

## 2019-01-08 DIAGNOSIS — Z853 Personal history of malignant neoplasm of breast: Secondary | ICD-10-CM | POA: Diagnosis not present

## 2019-01-08 DIAGNOSIS — R627 Adult failure to thrive: Secondary | ICD-10-CM | POA: Diagnosis not present

## 2019-01-08 DIAGNOSIS — I110 Hypertensive heart disease with heart failure: Secondary | ICD-10-CM | POA: Diagnosis not present

## 2019-01-08 DIAGNOSIS — M1990 Unspecified osteoarthritis, unspecified site: Secondary | ICD-10-CM | POA: Diagnosis not present

## 2019-01-08 DIAGNOSIS — G8929 Other chronic pain: Secondary | ICD-10-CM | POA: Diagnosis not present

## 2019-01-10 DIAGNOSIS — G8929 Other chronic pain: Secondary | ICD-10-CM | POA: Diagnosis not present

## 2019-01-10 DIAGNOSIS — I509 Heart failure, unspecified: Secondary | ICD-10-CM | POA: Diagnosis not present

## 2019-01-10 DIAGNOSIS — R627 Adult failure to thrive: Secondary | ICD-10-CM | POA: Diagnosis not present

## 2019-01-10 DIAGNOSIS — I110 Hypertensive heart disease with heart failure: Secondary | ICD-10-CM | POA: Diagnosis not present

## 2019-01-10 DIAGNOSIS — M1990 Unspecified osteoarthritis, unspecified site: Secondary | ICD-10-CM | POA: Diagnosis not present

## 2019-01-10 DIAGNOSIS — Z853 Personal history of malignant neoplasm of breast: Secondary | ICD-10-CM | POA: Diagnosis not present

## 2019-01-11 DIAGNOSIS — Z853 Personal history of malignant neoplasm of breast: Secondary | ICD-10-CM | POA: Diagnosis not present

## 2019-01-11 DIAGNOSIS — I110 Hypertensive heart disease with heart failure: Secondary | ICD-10-CM | POA: Diagnosis not present

## 2019-01-11 DIAGNOSIS — R627 Adult failure to thrive: Secondary | ICD-10-CM | POA: Diagnosis not present

## 2019-01-11 DIAGNOSIS — I509 Heart failure, unspecified: Secondary | ICD-10-CM | POA: Diagnosis not present

## 2019-01-11 DIAGNOSIS — G8929 Other chronic pain: Secondary | ICD-10-CM | POA: Diagnosis not present

## 2019-01-11 DIAGNOSIS — M1990 Unspecified osteoarthritis, unspecified site: Secondary | ICD-10-CM | POA: Diagnosis not present

## 2019-01-15 DIAGNOSIS — I509 Heart failure, unspecified: Secondary | ICD-10-CM | POA: Diagnosis not present

## 2019-01-15 DIAGNOSIS — G8929 Other chronic pain: Secondary | ICD-10-CM | POA: Diagnosis not present

## 2019-01-15 DIAGNOSIS — Z853 Personal history of malignant neoplasm of breast: Secondary | ICD-10-CM | POA: Diagnosis not present

## 2019-01-15 DIAGNOSIS — M1990 Unspecified osteoarthritis, unspecified site: Secondary | ICD-10-CM | POA: Diagnosis not present

## 2019-01-15 DIAGNOSIS — R627 Adult failure to thrive: Secondary | ICD-10-CM | POA: Diagnosis not present

## 2019-01-15 DIAGNOSIS — I110 Hypertensive heart disease with heart failure: Secondary | ICD-10-CM | POA: Diagnosis not present

## 2019-01-16 DIAGNOSIS — I509 Heart failure, unspecified: Secondary | ICD-10-CM | POA: Diagnosis not present

## 2019-01-16 DIAGNOSIS — I110 Hypertensive heart disease with heart failure: Secondary | ICD-10-CM | POA: Diagnosis not present

## 2019-01-16 DIAGNOSIS — Z853 Personal history of malignant neoplasm of breast: Secondary | ICD-10-CM | POA: Diagnosis not present

## 2019-01-16 DIAGNOSIS — G8929 Other chronic pain: Secondary | ICD-10-CM | POA: Diagnosis not present

## 2019-01-16 DIAGNOSIS — M1990 Unspecified osteoarthritis, unspecified site: Secondary | ICD-10-CM | POA: Diagnosis not present

## 2019-01-16 DIAGNOSIS — R627 Adult failure to thrive: Secondary | ICD-10-CM | POA: Diagnosis not present

## 2019-01-18 DIAGNOSIS — I110 Hypertensive heart disease with heart failure: Secondary | ICD-10-CM | POA: Diagnosis not present

## 2019-01-18 DIAGNOSIS — Z853 Personal history of malignant neoplasm of breast: Secondary | ICD-10-CM | POA: Diagnosis not present

## 2019-01-18 DIAGNOSIS — G8929 Other chronic pain: Secondary | ICD-10-CM | POA: Diagnosis not present

## 2019-01-18 DIAGNOSIS — I509 Heart failure, unspecified: Secondary | ICD-10-CM | POA: Diagnosis not present

## 2019-01-18 DIAGNOSIS — M1990 Unspecified osteoarthritis, unspecified site: Secondary | ICD-10-CM | POA: Diagnosis not present

## 2019-01-18 DIAGNOSIS — R627 Adult failure to thrive: Secondary | ICD-10-CM | POA: Diagnosis not present

## 2019-01-22 DIAGNOSIS — I509 Heart failure, unspecified: Secondary | ICD-10-CM | POA: Diagnosis not present

## 2019-01-22 DIAGNOSIS — R627 Adult failure to thrive: Secondary | ICD-10-CM | POA: Diagnosis not present

## 2019-01-22 DIAGNOSIS — I110 Hypertensive heart disease with heart failure: Secondary | ICD-10-CM | POA: Diagnosis not present

## 2019-01-22 DIAGNOSIS — M1990 Unspecified osteoarthritis, unspecified site: Secondary | ICD-10-CM | POA: Diagnosis not present

## 2019-01-22 DIAGNOSIS — G8929 Other chronic pain: Secondary | ICD-10-CM | POA: Diagnosis not present

## 2019-01-22 DIAGNOSIS — K921 Melena: Secondary | ICD-10-CM | POA: Diagnosis not present

## 2019-01-22 DIAGNOSIS — R634 Abnormal weight loss: Secondary | ICD-10-CM | POA: Diagnosis not present

## 2019-01-22 DIAGNOSIS — R1013 Epigastric pain: Secondary | ICD-10-CM | POA: Diagnosis not present

## 2019-01-22 DIAGNOSIS — Z853 Personal history of malignant neoplasm of breast: Secondary | ICD-10-CM | POA: Diagnosis not present

## 2019-01-23 DIAGNOSIS — I110 Hypertensive heart disease with heart failure: Secondary | ICD-10-CM | POA: Diagnosis not present

## 2019-01-23 DIAGNOSIS — M1990 Unspecified osteoarthritis, unspecified site: Secondary | ICD-10-CM | POA: Diagnosis not present

## 2019-01-23 DIAGNOSIS — G8929 Other chronic pain: Secondary | ICD-10-CM | POA: Diagnosis not present

## 2019-01-23 DIAGNOSIS — R627 Adult failure to thrive: Secondary | ICD-10-CM | POA: Diagnosis not present

## 2019-01-23 DIAGNOSIS — Z853 Personal history of malignant neoplasm of breast: Secondary | ICD-10-CM | POA: Diagnosis not present

## 2019-01-23 DIAGNOSIS — I509 Heart failure, unspecified: Secondary | ICD-10-CM | POA: Diagnosis not present

## 2019-01-25 DIAGNOSIS — G8929 Other chronic pain: Secondary | ICD-10-CM | POA: Diagnosis not present

## 2019-01-25 DIAGNOSIS — I110 Hypertensive heart disease with heart failure: Secondary | ICD-10-CM | POA: Diagnosis not present

## 2019-01-25 DIAGNOSIS — I509 Heart failure, unspecified: Secondary | ICD-10-CM | POA: Diagnosis not present

## 2019-01-25 DIAGNOSIS — Z853 Personal history of malignant neoplasm of breast: Secondary | ICD-10-CM | POA: Diagnosis not present

## 2019-01-25 DIAGNOSIS — R627 Adult failure to thrive: Secondary | ICD-10-CM | POA: Diagnosis not present

## 2019-01-25 DIAGNOSIS — M1990 Unspecified osteoarthritis, unspecified site: Secondary | ICD-10-CM | POA: Diagnosis not present

## 2019-01-29 DIAGNOSIS — I110 Hypertensive heart disease with heart failure: Secondary | ICD-10-CM | POA: Diagnosis not present

## 2019-01-29 DIAGNOSIS — I509 Heart failure, unspecified: Secondary | ICD-10-CM | POA: Diagnosis not present

## 2019-01-29 DIAGNOSIS — R627 Adult failure to thrive: Secondary | ICD-10-CM | POA: Diagnosis not present

## 2019-01-29 DIAGNOSIS — Z853 Personal history of malignant neoplasm of breast: Secondary | ICD-10-CM | POA: Diagnosis not present

## 2019-01-29 DIAGNOSIS — M1990 Unspecified osteoarthritis, unspecified site: Secondary | ICD-10-CM | POA: Diagnosis not present

## 2019-01-29 DIAGNOSIS — G8929 Other chronic pain: Secondary | ICD-10-CM | POA: Diagnosis not present

## 2019-01-30 DIAGNOSIS — Z853 Personal history of malignant neoplasm of breast: Secondary | ICD-10-CM | POA: Diagnosis not present

## 2019-01-30 DIAGNOSIS — I110 Hypertensive heart disease with heart failure: Secondary | ICD-10-CM | POA: Diagnosis not present

## 2019-01-30 DIAGNOSIS — E039 Hypothyroidism, unspecified: Secondary | ICD-10-CM | POA: Diagnosis not present

## 2019-01-30 DIAGNOSIS — I509 Heart failure, unspecified: Secondary | ICD-10-CM | POA: Diagnosis not present

## 2019-01-30 DIAGNOSIS — G8929 Other chronic pain: Secondary | ICD-10-CM | POA: Diagnosis not present

## 2019-01-30 DIAGNOSIS — K5900 Constipation, unspecified: Secondary | ICD-10-CM | POA: Diagnosis not present

## 2019-01-30 DIAGNOSIS — M1990 Unspecified osteoarthritis, unspecified site: Secondary | ICD-10-CM | POA: Diagnosis not present

## 2019-01-30 DIAGNOSIS — R627 Adult failure to thrive: Secondary | ICD-10-CM | POA: Diagnosis not present

## 2019-01-30 DIAGNOSIS — Z87440 Personal history of urinary (tract) infections: Secondary | ICD-10-CM | POA: Diagnosis not present

## 2019-01-30 DIAGNOSIS — M549 Dorsalgia, unspecified: Secondary | ICD-10-CM | POA: Diagnosis not present

## 2019-01-31 DIAGNOSIS — R627 Adult failure to thrive: Secondary | ICD-10-CM | POA: Diagnosis not present

## 2019-01-31 DIAGNOSIS — I110 Hypertensive heart disease with heart failure: Secondary | ICD-10-CM | POA: Diagnosis not present

## 2019-01-31 DIAGNOSIS — I509 Heart failure, unspecified: Secondary | ICD-10-CM | POA: Diagnosis not present

## 2019-01-31 DIAGNOSIS — M1990 Unspecified osteoarthritis, unspecified site: Secondary | ICD-10-CM | POA: Diagnosis not present

## 2019-01-31 DIAGNOSIS — G8929 Other chronic pain: Secondary | ICD-10-CM | POA: Diagnosis not present

## 2019-01-31 DIAGNOSIS — Z853 Personal history of malignant neoplasm of breast: Secondary | ICD-10-CM | POA: Diagnosis not present

## 2019-02-01 DIAGNOSIS — G8929 Other chronic pain: Secondary | ICD-10-CM | POA: Diagnosis not present

## 2019-02-01 DIAGNOSIS — I110 Hypertensive heart disease with heart failure: Secondary | ICD-10-CM | POA: Diagnosis not present

## 2019-02-01 DIAGNOSIS — R627 Adult failure to thrive: Secondary | ICD-10-CM | POA: Diagnosis not present

## 2019-02-01 DIAGNOSIS — Z853 Personal history of malignant neoplasm of breast: Secondary | ICD-10-CM | POA: Diagnosis not present

## 2019-02-01 DIAGNOSIS — I509 Heart failure, unspecified: Secondary | ICD-10-CM | POA: Diagnosis not present

## 2019-02-01 DIAGNOSIS — M1990 Unspecified osteoarthritis, unspecified site: Secondary | ICD-10-CM | POA: Diagnosis not present

## 2019-02-04 DIAGNOSIS — Z853 Personal history of malignant neoplasm of breast: Secondary | ICD-10-CM | POA: Diagnosis not present

## 2019-02-04 DIAGNOSIS — G8929 Other chronic pain: Secondary | ICD-10-CM | POA: Diagnosis not present

## 2019-02-04 DIAGNOSIS — R627 Adult failure to thrive: Secondary | ICD-10-CM | POA: Diagnosis not present

## 2019-02-04 DIAGNOSIS — I110 Hypertensive heart disease with heart failure: Secondary | ICD-10-CM | POA: Diagnosis not present

## 2019-02-04 DIAGNOSIS — M1990 Unspecified osteoarthritis, unspecified site: Secondary | ICD-10-CM | POA: Diagnosis not present

## 2019-02-04 DIAGNOSIS — I509 Heart failure, unspecified: Secondary | ICD-10-CM | POA: Diagnosis not present

## 2019-02-06 DIAGNOSIS — M1990 Unspecified osteoarthritis, unspecified site: Secondary | ICD-10-CM | POA: Diagnosis not present

## 2019-02-06 DIAGNOSIS — Z853 Personal history of malignant neoplasm of breast: Secondary | ICD-10-CM | POA: Diagnosis not present

## 2019-02-06 DIAGNOSIS — R627 Adult failure to thrive: Secondary | ICD-10-CM | POA: Diagnosis not present

## 2019-02-06 DIAGNOSIS — I110 Hypertensive heart disease with heart failure: Secondary | ICD-10-CM | POA: Diagnosis not present

## 2019-02-06 DIAGNOSIS — G8929 Other chronic pain: Secondary | ICD-10-CM | POA: Diagnosis not present

## 2019-02-06 DIAGNOSIS — I509 Heart failure, unspecified: Secondary | ICD-10-CM | POA: Diagnosis not present

## 2019-02-07 DIAGNOSIS — M1990 Unspecified osteoarthritis, unspecified site: Secondary | ICD-10-CM | POA: Diagnosis not present

## 2019-02-07 DIAGNOSIS — G8929 Other chronic pain: Secondary | ICD-10-CM | POA: Diagnosis not present

## 2019-02-07 DIAGNOSIS — I110 Hypertensive heart disease with heart failure: Secondary | ICD-10-CM | POA: Diagnosis not present

## 2019-02-07 DIAGNOSIS — R627 Adult failure to thrive: Secondary | ICD-10-CM | POA: Diagnosis not present

## 2019-02-07 DIAGNOSIS — Z853 Personal history of malignant neoplasm of breast: Secondary | ICD-10-CM | POA: Diagnosis not present

## 2019-02-07 DIAGNOSIS — I509 Heart failure, unspecified: Secondary | ICD-10-CM | POA: Diagnosis not present

## 2019-02-08 DIAGNOSIS — I110 Hypertensive heart disease with heart failure: Secondary | ICD-10-CM | POA: Diagnosis not present

## 2019-02-08 DIAGNOSIS — Z853 Personal history of malignant neoplasm of breast: Secondary | ICD-10-CM | POA: Diagnosis not present

## 2019-02-08 DIAGNOSIS — I509 Heart failure, unspecified: Secondary | ICD-10-CM | POA: Diagnosis not present

## 2019-02-08 DIAGNOSIS — M1990 Unspecified osteoarthritis, unspecified site: Secondary | ICD-10-CM | POA: Diagnosis not present

## 2019-02-08 DIAGNOSIS — R627 Adult failure to thrive: Secondary | ICD-10-CM | POA: Diagnosis not present

## 2019-02-08 DIAGNOSIS — G8929 Other chronic pain: Secondary | ICD-10-CM | POA: Diagnosis not present

## 2019-02-12 DIAGNOSIS — R627 Adult failure to thrive: Secondary | ICD-10-CM | POA: Diagnosis not present

## 2019-02-12 DIAGNOSIS — G8929 Other chronic pain: Secondary | ICD-10-CM | POA: Diagnosis not present

## 2019-02-12 DIAGNOSIS — I509 Heart failure, unspecified: Secondary | ICD-10-CM | POA: Diagnosis not present

## 2019-02-12 DIAGNOSIS — I110 Hypertensive heart disease with heart failure: Secondary | ICD-10-CM | POA: Diagnosis not present

## 2019-02-12 DIAGNOSIS — Z853 Personal history of malignant neoplasm of breast: Secondary | ICD-10-CM | POA: Diagnosis not present

## 2019-02-12 DIAGNOSIS — M1990 Unspecified osteoarthritis, unspecified site: Secondary | ICD-10-CM | POA: Diagnosis not present

## 2019-02-13 DIAGNOSIS — M1990 Unspecified osteoarthritis, unspecified site: Secondary | ICD-10-CM | POA: Diagnosis not present

## 2019-02-13 DIAGNOSIS — R627 Adult failure to thrive: Secondary | ICD-10-CM | POA: Diagnosis not present

## 2019-02-13 DIAGNOSIS — I110 Hypertensive heart disease with heart failure: Secondary | ICD-10-CM | POA: Diagnosis not present

## 2019-02-13 DIAGNOSIS — I509 Heart failure, unspecified: Secondary | ICD-10-CM | POA: Diagnosis not present

## 2019-02-13 DIAGNOSIS — Z853 Personal history of malignant neoplasm of breast: Secondary | ICD-10-CM | POA: Diagnosis not present

## 2019-02-13 DIAGNOSIS — G8929 Other chronic pain: Secondary | ICD-10-CM | POA: Diagnosis not present

## 2019-02-14 DIAGNOSIS — Z853 Personal history of malignant neoplasm of breast: Secondary | ICD-10-CM | POA: Diagnosis not present

## 2019-02-14 DIAGNOSIS — I509 Heart failure, unspecified: Secondary | ICD-10-CM | POA: Diagnosis not present

## 2019-02-14 DIAGNOSIS — R627 Adult failure to thrive: Secondary | ICD-10-CM | POA: Diagnosis not present

## 2019-02-14 DIAGNOSIS — M1990 Unspecified osteoarthritis, unspecified site: Secondary | ICD-10-CM | POA: Diagnosis not present

## 2019-02-14 DIAGNOSIS — I110 Hypertensive heart disease with heart failure: Secondary | ICD-10-CM | POA: Diagnosis not present

## 2019-02-14 DIAGNOSIS — G8929 Other chronic pain: Secondary | ICD-10-CM | POA: Diagnosis not present

## 2019-02-15 DIAGNOSIS — I509 Heart failure, unspecified: Secondary | ICD-10-CM | POA: Diagnosis not present

## 2019-02-15 DIAGNOSIS — M1990 Unspecified osteoarthritis, unspecified site: Secondary | ICD-10-CM | POA: Diagnosis not present

## 2019-02-15 DIAGNOSIS — R627 Adult failure to thrive: Secondary | ICD-10-CM | POA: Diagnosis not present

## 2019-02-15 DIAGNOSIS — G8929 Other chronic pain: Secondary | ICD-10-CM | POA: Diagnosis not present

## 2019-02-15 DIAGNOSIS — I110 Hypertensive heart disease with heart failure: Secondary | ICD-10-CM | POA: Diagnosis not present

## 2019-02-15 DIAGNOSIS — Z853 Personal history of malignant neoplasm of breast: Secondary | ICD-10-CM | POA: Diagnosis not present

## 2019-02-19 DIAGNOSIS — I509 Heart failure, unspecified: Secondary | ICD-10-CM | POA: Diagnosis not present

## 2019-02-19 DIAGNOSIS — G8929 Other chronic pain: Secondary | ICD-10-CM | POA: Diagnosis not present

## 2019-02-19 DIAGNOSIS — R627 Adult failure to thrive: Secondary | ICD-10-CM | POA: Diagnosis not present

## 2019-02-19 DIAGNOSIS — Z853 Personal history of malignant neoplasm of breast: Secondary | ICD-10-CM | POA: Diagnosis not present

## 2019-02-19 DIAGNOSIS — I110 Hypertensive heart disease with heart failure: Secondary | ICD-10-CM | POA: Diagnosis not present

## 2019-02-19 DIAGNOSIS — R1013 Epigastric pain: Secondary | ICD-10-CM | POA: Diagnosis not present

## 2019-02-19 DIAGNOSIS — M1990 Unspecified osteoarthritis, unspecified site: Secondary | ICD-10-CM | POA: Diagnosis not present

## 2019-02-19 DIAGNOSIS — R195 Other fecal abnormalities: Secondary | ICD-10-CM | POA: Diagnosis not present

## 2019-02-20 DIAGNOSIS — R627 Adult failure to thrive: Secondary | ICD-10-CM | POA: Diagnosis not present

## 2019-02-20 DIAGNOSIS — M1990 Unspecified osteoarthritis, unspecified site: Secondary | ICD-10-CM | POA: Diagnosis not present

## 2019-02-20 DIAGNOSIS — Z853 Personal history of malignant neoplasm of breast: Secondary | ICD-10-CM | POA: Diagnosis not present

## 2019-02-20 DIAGNOSIS — I110 Hypertensive heart disease with heart failure: Secondary | ICD-10-CM | POA: Diagnosis not present

## 2019-02-20 DIAGNOSIS — I509 Heart failure, unspecified: Secondary | ICD-10-CM | POA: Diagnosis not present

## 2019-02-20 DIAGNOSIS — G8929 Other chronic pain: Secondary | ICD-10-CM | POA: Diagnosis not present

## 2019-02-22 DIAGNOSIS — R627 Adult failure to thrive: Secondary | ICD-10-CM | POA: Diagnosis not present

## 2019-02-22 DIAGNOSIS — G8929 Other chronic pain: Secondary | ICD-10-CM | POA: Diagnosis not present

## 2019-02-22 DIAGNOSIS — I110 Hypertensive heart disease with heart failure: Secondary | ICD-10-CM | POA: Diagnosis not present

## 2019-02-22 DIAGNOSIS — M1990 Unspecified osteoarthritis, unspecified site: Secondary | ICD-10-CM | POA: Diagnosis not present

## 2019-02-22 DIAGNOSIS — Z853 Personal history of malignant neoplasm of breast: Secondary | ICD-10-CM | POA: Diagnosis not present

## 2019-02-22 DIAGNOSIS — I509 Heart failure, unspecified: Secondary | ICD-10-CM | POA: Diagnosis not present

## 2019-02-26 DIAGNOSIS — Z853 Personal history of malignant neoplasm of breast: Secondary | ICD-10-CM | POA: Diagnosis not present

## 2019-02-26 DIAGNOSIS — M1990 Unspecified osteoarthritis, unspecified site: Secondary | ICD-10-CM | POA: Diagnosis not present

## 2019-02-26 DIAGNOSIS — R627 Adult failure to thrive: Secondary | ICD-10-CM | POA: Diagnosis not present

## 2019-02-26 DIAGNOSIS — I509 Heart failure, unspecified: Secondary | ICD-10-CM | POA: Diagnosis not present

## 2019-02-26 DIAGNOSIS — G8929 Other chronic pain: Secondary | ICD-10-CM | POA: Diagnosis not present

## 2019-02-26 DIAGNOSIS — I110 Hypertensive heart disease with heart failure: Secondary | ICD-10-CM | POA: Diagnosis not present

## 2019-02-27 DIAGNOSIS — M1990 Unspecified osteoarthritis, unspecified site: Secondary | ICD-10-CM | POA: Diagnosis not present

## 2019-02-27 DIAGNOSIS — R627 Adult failure to thrive: Secondary | ICD-10-CM | POA: Diagnosis not present

## 2019-02-27 DIAGNOSIS — Z853 Personal history of malignant neoplasm of breast: Secondary | ICD-10-CM | POA: Diagnosis not present

## 2019-02-27 DIAGNOSIS — I110 Hypertensive heart disease with heart failure: Secondary | ICD-10-CM | POA: Diagnosis not present

## 2019-02-27 DIAGNOSIS — G8929 Other chronic pain: Secondary | ICD-10-CM | POA: Diagnosis not present

## 2019-02-27 DIAGNOSIS — I509 Heart failure, unspecified: Secondary | ICD-10-CM | POA: Diagnosis not present

## 2019-03-01 DIAGNOSIS — I509 Heart failure, unspecified: Secondary | ICD-10-CM | POA: Diagnosis not present

## 2019-03-01 DIAGNOSIS — M549 Dorsalgia, unspecified: Secondary | ICD-10-CM | POA: Diagnosis not present

## 2019-03-01 DIAGNOSIS — Z853 Personal history of malignant neoplasm of breast: Secondary | ICD-10-CM | POA: Diagnosis not present

## 2019-03-01 DIAGNOSIS — G8929 Other chronic pain: Secondary | ICD-10-CM | POA: Diagnosis not present

## 2019-03-01 DIAGNOSIS — Z87440 Personal history of urinary (tract) infections: Secondary | ICD-10-CM | POA: Diagnosis not present

## 2019-03-01 DIAGNOSIS — K5900 Constipation, unspecified: Secondary | ICD-10-CM | POA: Diagnosis not present

## 2019-03-01 DIAGNOSIS — E039 Hypothyroidism, unspecified: Secondary | ICD-10-CM | POA: Diagnosis not present

## 2019-03-01 DIAGNOSIS — I110 Hypertensive heart disease with heart failure: Secondary | ICD-10-CM | POA: Diagnosis not present

## 2019-03-01 DIAGNOSIS — M1990 Unspecified osteoarthritis, unspecified site: Secondary | ICD-10-CM | POA: Diagnosis not present

## 2019-03-01 DIAGNOSIS — R627 Adult failure to thrive: Secondary | ICD-10-CM | POA: Diagnosis not present

## 2019-03-05 DIAGNOSIS — Z853 Personal history of malignant neoplasm of breast: Secondary | ICD-10-CM | POA: Diagnosis not present

## 2019-03-05 DIAGNOSIS — M1990 Unspecified osteoarthritis, unspecified site: Secondary | ICD-10-CM | POA: Diagnosis not present

## 2019-03-05 DIAGNOSIS — I509 Heart failure, unspecified: Secondary | ICD-10-CM | POA: Diagnosis not present

## 2019-03-05 DIAGNOSIS — R627 Adult failure to thrive: Secondary | ICD-10-CM | POA: Diagnosis not present

## 2019-03-05 DIAGNOSIS — I110 Hypertensive heart disease with heart failure: Secondary | ICD-10-CM | POA: Diagnosis not present

## 2019-03-05 DIAGNOSIS — G8929 Other chronic pain: Secondary | ICD-10-CM | POA: Diagnosis not present

## 2019-03-06 DIAGNOSIS — R627 Adult failure to thrive: Secondary | ICD-10-CM | POA: Diagnosis not present

## 2019-03-06 DIAGNOSIS — G8929 Other chronic pain: Secondary | ICD-10-CM | POA: Diagnosis not present

## 2019-03-06 DIAGNOSIS — Z853 Personal history of malignant neoplasm of breast: Secondary | ICD-10-CM | POA: Diagnosis not present

## 2019-03-06 DIAGNOSIS — M1990 Unspecified osteoarthritis, unspecified site: Secondary | ICD-10-CM | POA: Diagnosis not present

## 2019-03-06 DIAGNOSIS — I509 Heart failure, unspecified: Secondary | ICD-10-CM | POA: Diagnosis not present

## 2019-03-06 DIAGNOSIS — I110 Hypertensive heart disease with heart failure: Secondary | ICD-10-CM | POA: Diagnosis not present

## 2019-03-07 DIAGNOSIS — I509 Heart failure, unspecified: Secondary | ICD-10-CM | POA: Diagnosis not present

## 2019-03-07 DIAGNOSIS — R627 Adult failure to thrive: Secondary | ICD-10-CM | POA: Diagnosis not present

## 2019-03-07 DIAGNOSIS — I110 Hypertensive heart disease with heart failure: Secondary | ICD-10-CM | POA: Diagnosis not present

## 2019-03-07 DIAGNOSIS — M1990 Unspecified osteoarthritis, unspecified site: Secondary | ICD-10-CM | POA: Diagnosis not present

## 2019-03-07 DIAGNOSIS — Z853 Personal history of malignant neoplasm of breast: Secondary | ICD-10-CM | POA: Diagnosis not present

## 2019-03-07 DIAGNOSIS — G8929 Other chronic pain: Secondary | ICD-10-CM | POA: Diagnosis not present

## 2019-03-08 DIAGNOSIS — G8929 Other chronic pain: Secondary | ICD-10-CM | POA: Diagnosis not present

## 2019-03-08 DIAGNOSIS — Z853 Personal history of malignant neoplasm of breast: Secondary | ICD-10-CM | POA: Diagnosis not present

## 2019-03-08 DIAGNOSIS — R627 Adult failure to thrive: Secondary | ICD-10-CM | POA: Diagnosis not present

## 2019-03-08 DIAGNOSIS — I509 Heart failure, unspecified: Secondary | ICD-10-CM | POA: Diagnosis not present

## 2019-03-08 DIAGNOSIS — M1990 Unspecified osteoarthritis, unspecified site: Secondary | ICD-10-CM | POA: Diagnosis not present

## 2019-03-08 DIAGNOSIS — I110 Hypertensive heart disease with heart failure: Secondary | ICD-10-CM | POA: Diagnosis not present

## 2019-03-12 DIAGNOSIS — I509 Heart failure, unspecified: Secondary | ICD-10-CM | POA: Diagnosis not present

## 2019-03-12 DIAGNOSIS — M1990 Unspecified osteoarthritis, unspecified site: Secondary | ICD-10-CM | POA: Diagnosis not present

## 2019-03-12 DIAGNOSIS — I110 Hypertensive heart disease with heart failure: Secondary | ICD-10-CM | POA: Diagnosis not present

## 2019-03-12 DIAGNOSIS — R627 Adult failure to thrive: Secondary | ICD-10-CM | POA: Diagnosis not present

## 2019-03-12 DIAGNOSIS — Z853 Personal history of malignant neoplasm of breast: Secondary | ICD-10-CM | POA: Diagnosis not present

## 2019-03-12 DIAGNOSIS — G8929 Other chronic pain: Secondary | ICD-10-CM | POA: Diagnosis not present

## 2019-03-13 DIAGNOSIS — R627 Adult failure to thrive: Secondary | ICD-10-CM | POA: Diagnosis not present

## 2019-03-13 DIAGNOSIS — I110 Hypertensive heart disease with heart failure: Secondary | ICD-10-CM | POA: Diagnosis not present

## 2019-03-13 DIAGNOSIS — I509 Heart failure, unspecified: Secondary | ICD-10-CM | POA: Diagnosis not present

## 2019-03-13 DIAGNOSIS — Z853 Personal history of malignant neoplasm of breast: Secondary | ICD-10-CM | POA: Diagnosis not present

## 2019-03-13 DIAGNOSIS — G8929 Other chronic pain: Secondary | ICD-10-CM | POA: Diagnosis not present

## 2019-03-13 DIAGNOSIS — M1990 Unspecified osteoarthritis, unspecified site: Secondary | ICD-10-CM | POA: Diagnosis not present

## 2019-03-15 DIAGNOSIS — G8929 Other chronic pain: Secondary | ICD-10-CM | POA: Diagnosis not present

## 2019-03-15 DIAGNOSIS — R627 Adult failure to thrive: Secondary | ICD-10-CM | POA: Diagnosis not present

## 2019-03-15 DIAGNOSIS — I509 Heart failure, unspecified: Secondary | ICD-10-CM | POA: Diagnosis not present

## 2019-03-15 DIAGNOSIS — Z853 Personal history of malignant neoplasm of breast: Secondary | ICD-10-CM | POA: Diagnosis not present

## 2019-03-15 DIAGNOSIS — I110 Hypertensive heart disease with heart failure: Secondary | ICD-10-CM | POA: Diagnosis not present

## 2019-03-15 DIAGNOSIS — M1990 Unspecified osteoarthritis, unspecified site: Secondary | ICD-10-CM | POA: Diagnosis not present

## 2019-03-19 DIAGNOSIS — M1990 Unspecified osteoarthritis, unspecified site: Secondary | ICD-10-CM | POA: Diagnosis not present

## 2019-03-19 DIAGNOSIS — R627 Adult failure to thrive: Secondary | ICD-10-CM | POA: Diagnosis not present

## 2019-03-19 DIAGNOSIS — I110 Hypertensive heart disease with heart failure: Secondary | ICD-10-CM | POA: Diagnosis not present

## 2019-03-19 DIAGNOSIS — I509 Heart failure, unspecified: Secondary | ICD-10-CM | POA: Diagnosis not present

## 2019-03-19 DIAGNOSIS — G8929 Other chronic pain: Secondary | ICD-10-CM | POA: Diagnosis not present

## 2019-03-19 DIAGNOSIS — Z853 Personal history of malignant neoplasm of breast: Secondary | ICD-10-CM | POA: Diagnosis not present

## 2019-03-20 DIAGNOSIS — Z853 Personal history of malignant neoplasm of breast: Secondary | ICD-10-CM | POA: Diagnosis not present

## 2019-03-20 DIAGNOSIS — I509 Heart failure, unspecified: Secondary | ICD-10-CM | POA: Diagnosis not present

## 2019-03-20 DIAGNOSIS — R627 Adult failure to thrive: Secondary | ICD-10-CM | POA: Diagnosis not present

## 2019-03-20 DIAGNOSIS — G8929 Other chronic pain: Secondary | ICD-10-CM | POA: Diagnosis not present

## 2019-03-20 DIAGNOSIS — M1990 Unspecified osteoarthritis, unspecified site: Secondary | ICD-10-CM | POA: Diagnosis not present

## 2019-03-20 DIAGNOSIS — I110 Hypertensive heart disease with heart failure: Secondary | ICD-10-CM | POA: Diagnosis not present

## 2019-03-22 DIAGNOSIS — M1990 Unspecified osteoarthritis, unspecified site: Secondary | ICD-10-CM | POA: Diagnosis not present

## 2019-03-22 DIAGNOSIS — I110 Hypertensive heart disease with heart failure: Secondary | ICD-10-CM | POA: Diagnosis not present

## 2019-03-22 DIAGNOSIS — R627 Adult failure to thrive: Secondary | ICD-10-CM | POA: Diagnosis not present

## 2019-03-22 DIAGNOSIS — G8929 Other chronic pain: Secondary | ICD-10-CM | POA: Diagnosis not present

## 2019-03-22 DIAGNOSIS — I509 Heart failure, unspecified: Secondary | ICD-10-CM | POA: Diagnosis not present

## 2019-03-22 DIAGNOSIS — Z853 Personal history of malignant neoplasm of breast: Secondary | ICD-10-CM | POA: Diagnosis not present

## 2019-03-25 DIAGNOSIS — I110 Hypertensive heart disease with heart failure: Secondary | ICD-10-CM | POA: Diagnosis not present

## 2019-03-25 DIAGNOSIS — I509 Heart failure, unspecified: Secondary | ICD-10-CM | POA: Diagnosis not present

## 2019-03-25 DIAGNOSIS — Z853 Personal history of malignant neoplasm of breast: Secondary | ICD-10-CM | POA: Diagnosis not present

## 2019-03-25 DIAGNOSIS — M1990 Unspecified osteoarthritis, unspecified site: Secondary | ICD-10-CM | POA: Diagnosis not present

## 2019-03-25 DIAGNOSIS — R627 Adult failure to thrive: Secondary | ICD-10-CM | POA: Diagnosis not present

## 2019-03-25 DIAGNOSIS — G8929 Other chronic pain: Secondary | ICD-10-CM | POA: Diagnosis not present

## 2019-03-26 DIAGNOSIS — G8929 Other chronic pain: Secondary | ICD-10-CM | POA: Diagnosis not present

## 2019-03-26 DIAGNOSIS — R627 Adult failure to thrive: Secondary | ICD-10-CM | POA: Diagnosis not present

## 2019-03-26 DIAGNOSIS — M1990 Unspecified osteoarthritis, unspecified site: Secondary | ICD-10-CM | POA: Diagnosis not present

## 2019-03-26 DIAGNOSIS — Z853 Personal history of malignant neoplasm of breast: Secondary | ICD-10-CM | POA: Diagnosis not present

## 2019-03-26 DIAGNOSIS — I110 Hypertensive heart disease with heart failure: Secondary | ICD-10-CM | POA: Diagnosis not present

## 2019-03-26 DIAGNOSIS — I509 Heart failure, unspecified: Secondary | ICD-10-CM | POA: Diagnosis not present

## 2019-03-27 DIAGNOSIS — Z853 Personal history of malignant neoplasm of breast: Secondary | ICD-10-CM | POA: Diagnosis not present

## 2019-03-27 DIAGNOSIS — I509 Heart failure, unspecified: Secondary | ICD-10-CM | POA: Diagnosis not present

## 2019-03-27 DIAGNOSIS — M1990 Unspecified osteoarthritis, unspecified site: Secondary | ICD-10-CM | POA: Diagnosis not present

## 2019-03-27 DIAGNOSIS — I110 Hypertensive heart disease with heart failure: Secondary | ICD-10-CM | POA: Diagnosis not present

## 2019-03-27 DIAGNOSIS — R627 Adult failure to thrive: Secondary | ICD-10-CM | POA: Diagnosis not present

## 2019-03-27 DIAGNOSIS — G8929 Other chronic pain: Secondary | ICD-10-CM | POA: Diagnosis not present

## 2019-03-29 DIAGNOSIS — G8929 Other chronic pain: Secondary | ICD-10-CM | POA: Diagnosis not present

## 2019-03-29 DIAGNOSIS — R627 Adult failure to thrive: Secondary | ICD-10-CM | POA: Diagnosis not present

## 2019-03-29 DIAGNOSIS — I509 Heart failure, unspecified: Secondary | ICD-10-CM | POA: Diagnosis not present

## 2019-03-29 DIAGNOSIS — I110 Hypertensive heart disease with heart failure: Secondary | ICD-10-CM | POA: Diagnosis not present

## 2019-03-29 DIAGNOSIS — Z853 Personal history of malignant neoplasm of breast: Secondary | ICD-10-CM | POA: Diagnosis not present

## 2019-03-29 DIAGNOSIS — M1990 Unspecified osteoarthritis, unspecified site: Secondary | ICD-10-CM | POA: Diagnosis not present

## 2019-04-01 DIAGNOSIS — R627 Adult failure to thrive: Secondary | ICD-10-CM | POA: Diagnosis not present

## 2019-04-01 DIAGNOSIS — I110 Hypertensive heart disease with heart failure: Secondary | ICD-10-CM | POA: Diagnosis not present

## 2019-04-01 DIAGNOSIS — K5900 Constipation, unspecified: Secondary | ICD-10-CM | POA: Diagnosis not present

## 2019-04-01 DIAGNOSIS — Z853 Personal history of malignant neoplasm of breast: Secondary | ICD-10-CM | POA: Diagnosis not present

## 2019-04-01 DIAGNOSIS — G8929 Other chronic pain: Secondary | ICD-10-CM | POA: Diagnosis not present

## 2019-04-01 DIAGNOSIS — I509 Heart failure, unspecified: Secondary | ICD-10-CM | POA: Diagnosis not present

## 2019-04-01 DIAGNOSIS — M1990 Unspecified osteoarthritis, unspecified site: Secondary | ICD-10-CM | POA: Diagnosis not present

## 2019-04-01 DIAGNOSIS — Z87440 Personal history of urinary (tract) infections: Secondary | ICD-10-CM | POA: Diagnosis not present

## 2019-04-01 DIAGNOSIS — M549 Dorsalgia, unspecified: Secondary | ICD-10-CM | POA: Diagnosis not present

## 2019-04-01 DIAGNOSIS — E039 Hypothyroidism, unspecified: Secondary | ICD-10-CM | POA: Diagnosis not present

## 2019-04-02 DIAGNOSIS — I509 Heart failure, unspecified: Secondary | ICD-10-CM | POA: Diagnosis not present

## 2019-04-02 DIAGNOSIS — G8929 Other chronic pain: Secondary | ICD-10-CM | POA: Diagnosis not present

## 2019-04-02 DIAGNOSIS — Z853 Personal history of malignant neoplasm of breast: Secondary | ICD-10-CM | POA: Diagnosis not present

## 2019-04-02 DIAGNOSIS — R627 Adult failure to thrive: Secondary | ICD-10-CM | POA: Diagnosis not present

## 2019-04-02 DIAGNOSIS — I110 Hypertensive heart disease with heart failure: Secondary | ICD-10-CM | POA: Diagnosis not present

## 2019-04-02 DIAGNOSIS — M1990 Unspecified osteoarthritis, unspecified site: Secondary | ICD-10-CM | POA: Diagnosis not present

## 2019-04-03 DIAGNOSIS — I509 Heart failure, unspecified: Secondary | ICD-10-CM | POA: Diagnosis not present

## 2019-04-03 DIAGNOSIS — M1990 Unspecified osteoarthritis, unspecified site: Secondary | ICD-10-CM | POA: Diagnosis not present

## 2019-04-03 DIAGNOSIS — I110 Hypertensive heart disease with heart failure: Secondary | ICD-10-CM | POA: Diagnosis not present

## 2019-04-03 DIAGNOSIS — Z853 Personal history of malignant neoplasm of breast: Secondary | ICD-10-CM | POA: Diagnosis not present

## 2019-04-03 DIAGNOSIS — G8929 Other chronic pain: Secondary | ICD-10-CM | POA: Diagnosis not present

## 2019-04-03 DIAGNOSIS — R627 Adult failure to thrive: Secondary | ICD-10-CM | POA: Diagnosis not present

## 2019-04-05 DIAGNOSIS — Z853 Personal history of malignant neoplasm of breast: Secondary | ICD-10-CM | POA: Diagnosis not present

## 2019-04-05 DIAGNOSIS — R627 Adult failure to thrive: Secondary | ICD-10-CM | POA: Diagnosis not present

## 2019-04-05 DIAGNOSIS — I110 Hypertensive heart disease with heart failure: Secondary | ICD-10-CM | POA: Diagnosis not present

## 2019-04-05 DIAGNOSIS — I509 Heart failure, unspecified: Secondary | ICD-10-CM | POA: Diagnosis not present

## 2019-04-05 DIAGNOSIS — M1990 Unspecified osteoarthritis, unspecified site: Secondary | ICD-10-CM | POA: Diagnosis not present

## 2019-04-05 DIAGNOSIS — G8929 Other chronic pain: Secondary | ICD-10-CM | POA: Diagnosis not present

## 2019-04-08 DIAGNOSIS — Z853 Personal history of malignant neoplasm of breast: Secondary | ICD-10-CM | POA: Diagnosis not present

## 2019-04-08 DIAGNOSIS — M1990 Unspecified osteoarthritis, unspecified site: Secondary | ICD-10-CM | POA: Diagnosis not present

## 2019-04-08 DIAGNOSIS — I110 Hypertensive heart disease with heart failure: Secondary | ICD-10-CM | POA: Diagnosis not present

## 2019-04-08 DIAGNOSIS — R627 Adult failure to thrive: Secondary | ICD-10-CM | POA: Diagnosis not present

## 2019-04-08 DIAGNOSIS — I509 Heart failure, unspecified: Secondary | ICD-10-CM | POA: Diagnosis not present

## 2019-04-08 DIAGNOSIS — G8929 Other chronic pain: Secondary | ICD-10-CM | POA: Diagnosis not present

## 2019-04-09 DIAGNOSIS — Z853 Personal history of malignant neoplasm of breast: Secondary | ICD-10-CM | POA: Diagnosis not present

## 2019-04-09 DIAGNOSIS — M1990 Unspecified osteoarthritis, unspecified site: Secondary | ICD-10-CM | POA: Diagnosis not present

## 2019-04-09 DIAGNOSIS — I110 Hypertensive heart disease with heart failure: Secondary | ICD-10-CM | POA: Diagnosis not present

## 2019-04-09 DIAGNOSIS — G8929 Other chronic pain: Secondary | ICD-10-CM | POA: Diagnosis not present

## 2019-04-09 DIAGNOSIS — I509 Heart failure, unspecified: Secondary | ICD-10-CM | POA: Diagnosis not present

## 2019-04-09 DIAGNOSIS — R627 Adult failure to thrive: Secondary | ICD-10-CM | POA: Diagnosis not present

## 2019-04-10 DIAGNOSIS — M1990 Unspecified osteoarthritis, unspecified site: Secondary | ICD-10-CM | POA: Diagnosis not present

## 2019-04-10 DIAGNOSIS — G8929 Other chronic pain: Secondary | ICD-10-CM | POA: Diagnosis not present

## 2019-04-10 DIAGNOSIS — Z853 Personal history of malignant neoplasm of breast: Secondary | ICD-10-CM | POA: Diagnosis not present

## 2019-04-10 DIAGNOSIS — I110 Hypertensive heart disease with heart failure: Secondary | ICD-10-CM | POA: Diagnosis not present

## 2019-04-10 DIAGNOSIS — R627 Adult failure to thrive: Secondary | ICD-10-CM | POA: Diagnosis not present

## 2019-04-10 DIAGNOSIS — I509 Heart failure, unspecified: Secondary | ICD-10-CM | POA: Diagnosis not present

## 2019-04-12 DIAGNOSIS — I509 Heart failure, unspecified: Secondary | ICD-10-CM | POA: Diagnosis not present

## 2019-04-12 DIAGNOSIS — M1990 Unspecified osteoarthritis, unspecified site: Secondary | ICD-10-CM | POA: Diagnosis not present

## 2019-04-12 DIAGNOSIS — G8929 Other chronic pain: Secondary | ICD-10-CM | POA: Diagnosis not present

## 2019-04-12 DIAGNOSIS — I110 Hypertensive heart disease with heart failure: Secondary | ICD-10-CM | POA: Diagnosis not present

## 2019-04-12 DIAGNOSIS — R627 Adult failure to thrive: Secondary | ICD-10-CM | POA: Diagnosis not present

## 2019-04-12 DIAGNOSIS — Z853 Personal history of malignant neoplasm of breast: Secondary | ICD-10-CM | POA: Diagnosis not present

## 2019-04-13 DIAGNOSIS — G8929 Other chronic pain: Secondary | ICD-10-CM | POA: Diagnosis not present

## 2019-04-13 DIAGNOSIS — I509 Heart failure, unspecified: Secondary | ICD-10-CM | POA: Diagnosis not present

## 2019-04-13 DIAGNOSIS — Z853 Personal history of malignant neoplasm of breast: Secondary | ICD-10-CM | POA: Diagnosis not present

## 2019-04-13 DIAGNOSIS — I110 Hypertensive heart disease with heart failure: Secondary | ICD-10-CM | POA: Diagnosis not present

## 2019-04-13 DIAGNOSIS — R627 Adult failure to thrive: Secondary | ICD-10-CM | POA: Diagnosis not present

## 2019-04-13 DIAGNOSIS — M1990 Unspecified osteoarthritis, unspecified site: Secondary | ICD-10-CM | POA: Diagnosis not present

## 2019-04-16 DIAGNOSIS — I509 Heart failure, unspecified: Secondary | ICD-10-CM | POA: Diagnosis not present

## 2019-04-16 DIAGNOSIS — G8929 Other chronic pain: Secondary | ICD-10-CM | POA: Diagnosis not present

## 2019-04-16 DIAGNOSIS — Z853 Personal history of malignant neoplasm of breast: Secondary | ICD-10-CM | POA: Diagnosis not present

## 2019-04-16 DIAGNOSIS — I110 Hypertensive heart disease with heart failure: Secondary | ICD-10-CM | POA: Diagnosis not present

## 2019-04-16 DIAGNOSIS — M1990 Unspecified osteoarthritis, unspecified site: Secondary | ICD-10-CM | POA: Diagnosis not present

## 2019-04-16 DIAGNOSIS — R627 Adult failure to thrive: Secondary | ICD-10-CM | POA: Diagnosis not present

## 2019-04-17 DIAGNOSIS — Z853 Personal history of malignant neoplasm of breast: Secondary | ICD-10-CM | POA: Diagnosis not present

## 2019-04-17 DIAGNOSIS — I509 Heart failure, unspecified: Secondary | ICD-10-CM | POA: Diagnosis not present

## 2019-04-17 DIAGNOSIS — R627 Adult failure to thrive: Secondary | ICD-10-CM | POA: Diagnosis not present

## 2019-04-17 DIAGNOSIS — M1990 Unspecified osteoarthritis, unspecified site: Secondary | ICD-10-CM | POA: Diagnosis not present

## 2019-04-17 DIAGNOSIS — I110 Hypertensive heart disease with heart failure: Secondary | ICD-10-CM | POA: Diagnosis not present

## 2019-04-17 DIAGNOSIS — G8929 Other chronic pain: Secondary | ICD-10-CM | POA: Diagnosis not present

## 2019-04-19 DIAGNOSIS — G8929 Other chronic pain: Secondary | ICD-10-CM | POA: Diagnosis not present

## 2019-04-19 DIAGNOSIS — Z853 Personal history of malignant neoplasm of breast: Secondary | ICD-10-CM | POA: Diagnosis not present

## 2019-04-19 DIAGNOSIS — I509 Heart failure, unspecified: Secondary | ICD-10-CM | POA: Diagnosis not present

## 2019-04-19 DIAGNOSIS — M1990 Unspecified osteoarthritis, unspecified site: Secondary | ICD-10-CM | POA: Diagnosis not present

## 2019-04-19 DIAGNOSIS — I110 Hypertensive heart disease with heart failure: Secondary | ICD-10-CM | POA: Diagnosis not present

## 2019-04-19 DIAGNOSIS — R627 Adult failure to thrive: Secondary | ICD-10-CM | POA: Diagnosis not present

## 2019-04-23 DIAGNOSIS — R627 Adult failure to thrive: Secondary | ICD-10-CM | POA: Diagnosis not present

## 2019-04-23 DIAGNOSIS — I509 Heart failure, unspecified: Secondary | ICD-10-CM | POA: Diagnosis not present

## 2019-04-23 DIAGNOSIS — G8929 Other chronic pain: Secondary | ICD-10-CM | POA: Diagnosis not present

## 2019-04-23 DIAGNOSIS — M1990 Unspecified osteoarthritis, unspecified site: Secondary | ICD-10-CM | POA: Diagnosis not present

## 2019-04-23 DIAGNOSIS — I110 Hypertensive heart disease with heart failure: Secondary | ICD-10-CM | POA: Diagnosis not present

## 2019-04-23 DIAGNOSIS — Z853 Personal history of malignant neoplasm of breast: Secondary | ICD-10-CM | POA: Diagnosis not present

## 2019-04-24 DIAGNOSIS — R627 Adult failure to thrive: Secondary | ICD-10-CM | POA: Diagnosis not present

## 2019-04-24 DIAGNOSIS — I509 Heart failure, unspecified: Secondary | ICD-10-CM | POA: Diagnosis not present

## 2019-04-24 DIAGNOSIS — I110 Hypertensive heart disease with heart failure: Secondary | ICD-10-CM | POA: Diagnosis not present

## 2019-04-24 DIAGNOSIS — G8929 Other chronic pain: Secondary | ICD-10-CM | POA: Diagnosis not present

## 2019-04-24 DIAGNOSIS — M1990 Unspecified osteoarthritis, unspecified site: Secondary | ICD-10-CM | POA: Diagnosis not present

## 2019-04-24 DIAGNOSIS — Z853 Personal history of malignant neoplasm of breast: Secondary | ICD-10-CM | POA: Diagnosis not present

## 2019-04-25 ENCOUNTER — Other Ambulatory Visit: Payer: Self-pay

## 2019-04-25 DIAGNOSIS — I509 Heart failure, unspecified: Secondary | ICD-10-CM | POA: Diagnosis not present

## 2019-04-25 DIAGNOSIS — M1990 Unspecified osteoarthritis, unspecified site: Secondary | ICD-10-CM | POA: Diagnosis not present

## 2019-04-25 DIAGNOSIS — I110 Hypertensive heart disease with heart failure: Secondary | ICD-10-CM | POA: Diagnosis not present

## 2019-04-25 DIAGNOSIS — G8929 Other chronic pain: Secondary | ICD-10-CM | POA: Diagnosis not present

## 2019-04-25 DIAGNOSIS — Z853 Personal history of malignant neoplasm of breast: Secondary | ICD-10-CM | POA: Diagnosis not present

## 2019-04-25 DIAGNOSIS — R627 Adult failure to thrive: Secondary | ICD-10-CM | POA: Diagnosis not present

## 2019-04-30 DIAGNOSIS — Z853 Personal history of malignant neoplasm of breast: Secondary | ICD-10-CM | POA: Diagnosis not present

## 2019-04-30 DIAGNOSIS — I110 Hypertensive heart disease with heart failure: Secondary | ICD-10-CM | POA: Diagnosis not present

## 2019-04-30 DIAGNOSIS — G8929 Other chronic pain: Secondary | ICD-10-CM | POA: Diagnosis not present

## 2019-04-30 DIAGNOSIS — I509 Heart failure, unspecified: Secondary | ICD-10-CM | POA: Diagnosis not present

## 2019-04-30 DIAGNOSIS — M1990 Unspecified osteoarthritis, unspecified site: Secondary | ICD-10-CM | POA: Diagnosis not present

## 2019-04-30 DIAGNOSIS — R627 Adult failure to thrive: Secondary | ICD-10-CM | POA: Diagnosis not present

## 2020-03-31 DIAGNOSIS — M1990 Unspecified osteoarthritis, unspecified site: Secondary | ICD-10-CM | POA: Diagnosis not present

## 2020-03-31 DIAGNOSIS — I509 Heart failure, unspecified: Secondary | ICD-10-CM | POA: Diagnosis not present

## 2020-03-31 DIAGNOSIS — E039 Hypothyroidism, unspecified: Secondary | ICD-10-CM | POA: Diagnosis not present

## 2020-03-31 DIAGNOSIS — K5900 Constipation, unspecified: Secondary | ICD-10-CM | POA: Diagnosis not present

## 2020-03-31 DIAGNOSIS — G8929 Other chronic pain: Secondary | ICD-10-CM | POA: Diagnosis not present

## 2020-03-31 DIAGNOSIS — I110 Hypertensive heart disease with heart failure: Secondary | ICD-10-CM | POA: Diagnosis not present

## 2020-03-31 DIAGNOSIS — R627 Adult failure to thrive: Secondary | ICD-10-CM | POA: Diagnosis not present

## 2020-03-31 DIAGNOSIS — Z87440 Personal history of urinary (tract) infections: Secondary | ICD-10-CM | POA: Diagnosis not present

## 2020-03-31 DIAGNOSIS — M549 Dorsalgia, unspecified: Secondary | ICD-10-CM | POA: Diagnosis not present

## 2020-03-31 DIAGNOSIS — Z853 Personal history of malignant neoplasm of breast: Secondary | ICD-10-CM | POA: Diagnosis not present

## 2020-04-01 DIAGNOSIS — I509 Heart failure, unspecified: Secondary | ICD-10-CM | POA: Diagnosis not present

## 2020-04-01 DIAGNOSIS — I110 Hypertensive heart disease with heart failure: Secondary | ICD-10-CM | POA: Diagnosis not present

## 2020-04-01 DIAGNOSIS — Z853 Personal history of malignant neoplasm of breast: Secondary | ICD-10-CM | POA: Diagnosis not present

## 2020-04-01 DIAGNOSIS — M1990 Unspecified osteoarthritis, unspecified site: Secondary | ICD-10-CM | POA: Diagnosis not present

## 2020-04-01 DIAGNOSIS — R627 Adult failure to thrive: Secondary | ICD-10-CM | POA: Diagnosis not present

## 2020-04-01 DIAGNOSIS — G8929 Other chronic pain: Secondary | ICD-10-CM | POA: Diagnosis not present

## 2020-04-02 DIAGNOSIS — Z853 Personal history of malignant neoplasm of breast: Secondary | ICD-10-CM | POA: Diagnosis not present

## 2020-04-02 DIAGNOSIS — G8929 Other chronic pain: Secondary | ICD-10-CM | POA: Diagnosis not present

## 2020-04-02 DIAGNOSIS — M1990 Unspecified osteoarthritis, unspecified site: Secondary | ICD-10-CM | POA: Diagnosis not present

## 2020-04-02 DIAGNOSIS — I110 Hypertensive heart disease with heart failure: Secondary | ICD-10-CM | POA: Diagnosis not present

## 2020-04-02 DIAGNOSIS — I509 Heart failure, unspecified: Secondary | ICD-10-CM | POA: Diagnosis not present

## 2020-04-02 DIAGNOSIS — R627 Adult failure to thrive: Secondary | ICD-10-CM | POA: Diagnosis not present

## 2020-04-03 DIAGNOSIS — R627 Adult failure to thrive: Secondary | ICD-10-CM | POA: Diagnosis not present

## 2020-04-03 DIAGNOSIS — I509 Heart failure, unspecified: Secondary | ICD-10-CM | POA: Diagnosis not present

## 2020-04-03 DIAGNOSIS — I110 Hypertensive heart disease with heart failure: Secondary | ICD-10-CM | POA: Diagnosis not present

## 2020-04-03 DIAGNOSIS — G8929 Other chronic pain: Secondary | ICD-10-CM | POA: Diagnosis not present

## 2020-04-03 DIAGNOSIS — Z853 Personal history of malignant neoplasm of breast: Secondary | ICD-10-CM | POA: Diagnosis not present

## 2020-04-03 DIAGNOSIS — M1990 Unspecified osteoarthritis, unspecified site: Secondary | ICD-10-CM | POA: Diagnosis not present

## 2020-04-04 DIAGNOSIS — G8929 Other chronic pain: Secondary | ICD-10-CM | POA: Diagnosis not present

## 2020-04-04 DIAGNOSIS — I110 Hypertensive heart disease with heart failure: Secondary | ICD-10-CM | POA: Diagnosis not present

## 2020-04-04 DIAGNOSIS — I509 Heart failure, unspecified: Secondary | ICD-10-CM | POA: Diagnosis not present

## 2020-04-04 DIAGNOSIS — Z853 Personal history of malignant neoplasm of breast: Secondary | ICD-10-CM | POA: Diagnosis not present

## 2020-04-04 DIAGNOSIS — R627 Adult failure to thrive: Secondary | ICD-10-CM | POA: Diagnosis not present

## 2020-04-04 DIAGNOSIS — M1990 Unspecified osteoarthritis, unspecified site: Secondary | ICD-10-CM | POA: Diagnosis not present

## 2020-04-07 DIAGNOSIS — M1990 Unspecified osteoarthritis, unspecified site: Secondary | ICD-10-CM | POA: Diagnosis not present

## 2020-04-07 DIAGNOSIS — Z853 Personal history of malignant neoplasm of breast: Secondary | ICD-10-CM | POA: Diagnosis not present

## 2020-04-07 DIAGNOSIS — I110 Hypertensive heart disease with heart failure: Secondary | ICD-10-CM | POA: Diagnosis not present

## 2020-04-07 DIAGNOSIS — I509 Heart failure, unspecified: Secondary | ICD-10-CM | POA: Diagnosis not present

## 2020-04-07 DIAGNOSIS — G8929 Other chronic pain: Secondary | ICD-10-CM | POA: Diagnosis not present

## 2020-04-07 DIAGNOSIS — R627 Adult failure to thrive: Secondary | ICD-10-CM | POA: Diagnosis not present

## 2020-04-08 DIAGNOSIS — M1990 Unspecified osteoarthritis, unspecified site: Secondary | ICD-10-CM | POA: Diagnosis not present

## 2020-04-08 DIAGNOSIS — Z853 Personal history of malignant neoplasm of breast: Secondary | ICD-10-CM | POA: Diagnosis not present

## 2020-04-08 DIAGNOSIS — I509 Heart failure, unspecified: Secondary | ICD-10-CM | POA: Diagnosis not present

## 2020-04-08 DIAGNOSIS — I110 Hypertensive heart disease with heart failure: Secondary | ICD-10-CM | POA: Diagnosis not present

## 2020-04-08 DIAGNOSIS — R627 Adult failure to thrive: Secondary | ICD-10-CM | POA: Diagnosis not present

## 2020-04-08 DIAGNOSIS — G8929 Other chronic pain: Secondary | ICD-10-CM | POA: Diagnosis not present

## 2020-04-09 DIAGNOSIS — Z853 Personal history of malignant neoplasm of breast: Secondary | ICD-10-CM | POA: Diagnosis not present

## 2020-04-09 DIAGNOSIS — R627 Adult failure to thrive: Secondary | ICD-10-CM | POA: Diagnosis not present

## 2020-04-09 DIAGNOSIS — M1990 Unspecified osteoarthritis, unspecified site: Secondary | ICD-10-CM | POA: Diagnosis not present

## 2020-04-09 DIAGNOSIS — I509 Heart failure, unspecified: Secondary | ICD-10-CM | POA: Diagnosis not present

## 2020-04-09 DIAGNOSIS — G8929 Other chronic pain: Secondary | ICD-10-CM | POA: Diagnosis not present

## 2020-04-09 DIAGNOSIS — I110 Hypertensive heart disease with heart failure: Secondary | ICD-10-CM | POA: Diagnosis not present

## 2020-04-11 DIAGNOSIS — R627 Adult failure to thrive: Secondary | ICD-10-CM | POA: Diagnosis not present

## 2020-04-11 DIAGNOSIS — G8929 Other chronic pain: Secondary | ICD-10-CM | POA: Diagnosis not present

## 2020-04-11 DIAGNOSIS — I509 Heart failure, unspecified: Secondary | ICD-10-CM | POA: Diagnosis not present

## 2020-04-11 DIAGNOSIS — I110 Hypertensive heart disease with heart failure: Secondary | ICD-10-CM | POA: Diagnosis not present

## 2020-04-11 DIAGNOSIS — M1990 Unspecified osteoarthritis, unspecified site: Secondary | ICD-10-CM | POA: Diagnosis not present

## 2020-04-11 DIAGNOSIS — Z853 Personal history of malignant neoplasm of breast: Secondary | ICD-10-CM | POA: Diagnosis not present

## 2020-04-14 DIAGNOSIS — M1990 Unspecified osteoarthritis, unspecified site: Secondary | ICD-10-CM | POA: Diagnosis not present

## 2020-04-14 DIAGNOSIS — G8929 Other chronic pain: Secondary | ICD-10-CM | POA: Diagnosis not present

## 2020-04-14 DIAGNOSIS — I110 Hypertensive heart disease with heart failure: Secondary | ICD-10-CM | POA: Diagnosis not present

## 2020-04-14 DIAGNOSIS — Z853 Personal history of malignant neoplasm of breast: Secondary | ICD-10-CM | POA: Diagnosis not present

## 2020-04-14 DIAGNOSIS — I509 Heart failure, unspecified: Secondary | ICD-10-CM | POA: Diagnosis not present

## 2020-04-14 DIAGNOSIS — R627 Adult failure to thrive: Secondary | ICD-10-CM | POA: Diagnosis not present

## 2020-04-15 DIAGNOSIS — M1990 Unspecified osteoarthritis, unspecified site: Secondary | ICD-10-CM | POA: Diagnosis not present

## 2020-04-15 DIAGNOSIS — R627 Adult failure to thrive: Secondary | ICD-10-CM | POA: Diagnosis not present

## 2020-04-15 DIAGNOSIS — G8929 Other chronic pain: Secondary | ICD-10-CM | POA: Diagnosis not present

## 2020-04-15 DIAGNOSIS — I509 Heart failure, unspecified: Secondary | ICD-10-CM | POA: Diagnosis not present

## 2020-04-15 DIAGNOSIS — Z853 Personal history of malignant neoplasm of breast: Secondary | ICD-10-CM | POA: Diagnosis not present

## 2020-04-15 DIAGNOSIS — I110 Hypertensive heart disease with heart failure: Secondary | ICD-10-CM | POA: Diagnosis not present

## 2020-04-16 DIAGNOSIS — I509 Heart failure, unspecified: Secondary | ICD-10-CM | POA: Diagnosis not present

## 2020-04-16 DIAGNOSIS — Z853 Personal history of malignant neoplasm of breast: Secondary | ICD-10-CM | POA: Diagnosis not present

## 2020-04-16 DIAGNOSIS — R627 Adult failure to thrive: Secondary | ICD-10-CM | POA: Diagnosis not present

## 2020-04-16 DIAGNOSIS — G8929 Other chronic pain: Secondary | ICD-10-CM | POA: Diagnosis not present

## 2020-04-16 DIAGNOSIS — M1990 Unspecified osteoarthritis, unspecified site: Secondary | ICD-10-CM | POA: Diagnosis not present

## 2020-04-16 DIAGNOSIS — I110 Hypertensive heart disease with heart failure: Secondary | ICD-10-CM | POA: Diagnosis not present

## 2020-04-18 DIAGNOSIS — G8929 Other chronic pain: Secondary | ICD-10-CM | POA: Diagnosis not present

## 2020-04-18 DIAGNOSIS — R627 Adult failure to thrive: Secondary | ICD-10-CM | POA: Diagnosis not present

## 2020-04-18 DIAGNOSIS — I110 Hypertensive heart disease with heart failure: Secondary | ICD-10-CM | POA: Diagnosis not present

## 2020-04-18 DIAGNOSIS — Z853 Personal history of malignant neoplasm of breast: Secondary | ICD-10-CM | POA: Diagnosis not present

## 2020-04-18 DIAGNOSIS — M1990 Unspecified osteoarthritis, unspecified site: Secondary | ICD-10-CM | POA: Diagnosis not present

## 2020-04-18 DIAGNOSIS — I509 Heart failure, unspecified: Secondary | ICD-10-CM | POA: Diagnosis not present

## 2020-04-21 DIAGNOSIS — R627 Adult failure to thrive: Secondary | ICD-10-CM | POA: Diagnosis not present

## 2020-04-21 DIAGNOSIS — G8929 Other chronic pain: Secondary | ICD-10-CM | POA: Diagnosis not present

## 2020-04-21 DIAGNOSIS — I509 Heart failure, unspecified: Secondary | ICD-10-CM | POA: Diagnosis not present

## 2020-04-21 DIAGNOSIS — I110 Hypertensive heart disease with heart failure: Secondary | ICD-10-CM | POA: Diagnosis not present

## 2020-04-21 DIAGNOSIS — Z853 Personal history of malignant neoplasm of breast: Secondary | ICD-10-CM | POA: Diagnosis not present

## 2020-04-21 DIAGNOSIS — M1990 Unspecified osteoarthritis, unspecified site: Secondary | ICD-10-CM | POA: Diagnosis not present

## 2020-04-22 DIAGNOSIS — R627 Adult failure to thrive: Secondary | ICD-10-CM | POA: Diagnosis not present

## 2020-04-22 DIAGNOSIS — M1990 Unspecified osteoarthritis, unspecified site: Secondary | ICD-10-CM | POA: Diagnosis not present

## 2020-04-22 DIAGNOSIS — I110 Hypertensive heart disease with heart failure: Secondary | ICD-10-CM | POA: Diagnosis not present

## 2020-04-22 DIAGNOSIS — I509 Heart failure, unspecified: Secondary | ICD-10-CM | POA: Diagnosis not present

## 2020-04-22 DIAGNOSIS — G8929 Other chronic pain: Secondary | ICD-10-CM | POA: Diagnosis not present

## 2020-04-22 DIAGNOSIS — Z853 Personal history of malignant neoplasm of breast: Secondary | ICD-10-CM | POA: Diagnosis not present

## 2020-04-23 DIAGNOSIS — G8929 Other chronic pain: Secondary | ICD-10-CM | POA: Diagnosis not present

## 2020-04-23 DIAGNOSIS — I509 Heart failure, unspecified: Secondary | ICD-10-CM | POA: Diagnosis not present

## 2020-04-23 DIAGNOSIS — I110 Hypertensive heart disease with heart failure: Secondary | ICD-10-CM | POA: Diagnosis not present

## 2020-04-23 DIAGNOSIS — R627 Adult failure to thrive: Secondary | ICD-10-CM | POA: Diagnosis not present

## 2020-04-23 DIAGNOSIS — Z853 Personal history of malignant neoplasm of breast: Secondary | ICD-10-CM | POA: Diagnosis not present

## 2020-04-23 DIAGNOSIS — M1990 Unspecified osteoarthritis, unspecified site: Secondary | ICD-10-CM | POA: Diagnosis not present

## 2020-04-25 DIAGNOSIS — G8929 Other chronic pain: Secondary | ICD-10-CM | POA: Diagnosis not present

## 2020-04-25 DIAGNOSIS — R627 Adult failure to thrive: Secondary | ICD-10-CM | POA: Diagnosis not present

## 2020-04-25 DIAGNOSIS — Z853 Personal history of malignant neoplasm of breast: Secondary | ICD-10-CM | POA: Diagnosis not present

## 2020-04-25 DIAGNOSIS — I509 Heart failure, unspecified: Secondary | ICD-10-CM | POA: Diagnosis not present

## 2020-04-25 DIAGNOSIS — I110 Hypertensive heart disease with heart failure: Secondary | ICD-10-CM | POA: Diagnosis not present

## 2020-04-25 DIAGNOSIS — M1990 Unspecified osteoarthritis, unspecified site: Secondary | ICD-10-CM | POA: Diagnosis not present

## 2020-04-26 DIAGNOSIS — I110 Hypertensive heart disease with heart failure: Secondary | ICD-10-CM | POA: Diagnosis not present

## 2020-04-26 DIAGNOSIS — I509 Heart failure, unspecified: Secondary | ICD-10-CM | POA: Diagnosis not present

## 2020-04-26 DIAGNOSIS — M1990 Unspecified osteoarthritis, unspecified site: Secondary | ICD-10-CM | POA: Diagnosis not present

## 2020-04-26 DIAGNOSIS — G8929 Other chronic pain: Secondary | ICD-10-CM | POA: Diagnosis not present

## 2020-04-26 DIAGNOSIS — Z853 Personal history of malignant neoplasm of breast: Secondary | ICD-10-CM | POA: Diagnosis not present

## 2020-04-26 DIAGNOSIS — R627 Adult failure to thrive: Secondary | ICD-10-CM | POA: Diagnosis not present

## 2020-04-28 DIAGNOSIS — I110 Hypertensive heart disease with heart failure: Secondary | ICD-10-CM | POA: Diagnosis not present

## 2020-04-28 DIAGNOSIS — I509 Heart failure, unspecified: Secondary | ICD-10-CM | POA: Diagnosis not present

## 2020-04-28 DIAGNOSIS — Z853 Personal history of malignant neoplasm of breast: Secondary | ICD-10-CM | POA: Diagnosis not present

## 2020-04-28 DIAGNOSIS — R627 Adult failure to thrive: Secondary | ICD-10-CM | POA: Diagnosis not present

## 2020-04-28 DIAGNOSIS — M1990 Unspecified osteoarthritis, unspecified site: Secondary | ICD-10-CM | POA: Diagnosis not present

## 2020-04-28 DIAGNOSIS — G8929 Other chronic pain: Secondary | ICD-10-CM | POA: Diagnosis not present

## 2020-04-29 DIAGNOSIS — M1990 Unspecified osteoarthritis, unspecified site: Secondary | ICD-10-CM | POA: Diagnosis not present

## 2020-04-29 DIAGNOSIS — R627 Adult failure to thrive: Secondary | ICD-10-CM | POA: Diagnosis not present

## 2020-04-29 DIAGNOSIS — Z853 Personal history of malignant neoplasm of breast: Secondary | ICD-10-CM | POA: Diagnosis not present

## 2020-04-29 DIAGNOSIS — I110 Hypertensive heart disease with heart failure: Secondary | ICD-10-CM | POA: Diagnosis not present

## 2020-04-29 DIAGNOSIS — I509 Heart failure, unspecified: Secondary | ICD-10-CM | POA: Diagnosis not present

## 2020-04-29 DIAGNOSIS — G8929 Other chronic pain: Secondary | ICD-10-CM | POA: Diagnosis not present

## 2020-05-31 DIAGNOSIS — Z87440 Personal history of urinary (tract) infections: Secondary | ICD-10-CM | POA: Diagnosis not present

## 2020-05-31 DIAGNOSIS — M549 Dorsalgia, unspecified: Secondary | ICD-10-CM | POA: Diagnosis not present

## 2020-05-31 DIAGNOSIS — K5900 Constipation, unspecified: Secondary | ICD-10-CM | POA: Diagnosis not present

## 2020-05-31 DIAGNOSIS — E039 Hypothyroidism, unspecified: Secondary | ICD-10-CM | POA: Diagnosis not present

## 2020-05-31 DIAGNOSIS — Z853 Personal history of malignant neoplasm of breast: Secondary | ICD-10-CM | POA: Diagnosis not present

## 2020-05-31 DIAGNOSIS — I110 Hypertensive heart disease with heart failure: Secondary | ICD-10-CM | POA: Diagnosis not present

## 2020-05-31 DIAGNOSIS — M1990 Unspecified osteoarthritis, unspecified site: Secondary | ICD-10-CM | POA: Diagnosis not present

## 2020-05-31 DIAGNOSIS — G8929 Other chronic pain: Secondary | ICD-10-CM | POA: Diagnosis not present

## 2020-05-31 DIAGNOSIS — R627 Adult failure to thrive: Secondary | ICD-10-CM | POA: Diagnosis not present

## 2020-05-31 DIAGNOSIS — I509 Heart failure, unspecified: Secondary | ICD-10-CM | POA: Diagnosis not present

## 2020-06-03 DIAGNOSIS — I110 Hypertensive heart disease with heart failure: Secondary | ICD-10-CM | POA: Diagnosis not present

## 2020-06-03 DIAGNOSIS — G8929 Other chronic pain: Secondary | ICD-10-CM | POA: Diagnosis not present

## 2020-06-03 DIAGNOSIS — Z853 Personal history of malignant neoplasm of breast: Secondary | ICD-10-CM | POA: Diagnosis not present

## 2020-06-03 DIAGNOSIS — M1990 Unspecified osteoarthritis, unspecified site: Secondary | ICD-10-CM | POA: Diagnosis not present

## 2020-06-03 DIAGNOSIS — I509 Heart failure, unspecified: Secondary | ICD-10-CM | POA: Diagnosis not present

## 2020-06-03 DIAGNOSIS — R627 Adult failure to thrive: Secondary | ICD-10-CM | POA: Diagnosis not present

## 2020-06-04 DIAGNOSIS — Z853 Personal history of malignant neoplasm of breast: Secondary | ICD-10-CM | POA: Diagnosis not present

## 2020-06-04 DIAGNOSIS — R627 Adult failure to thrive: Secondary | ICD-10-CM | POA: Diagnosis not present

## 2020-06-04 DIAGNOSIS — I110 Hypertensive heart disease with heart failure: Secondary | ICD-10-CM | POA: Diagnosis not present

## 2020-06-04 DIAGNOSIS — G8929 Other chronic pain: Secondary | ICD-10-CM | POA: Diagnosis not present

## 2020-06-04 DIAGNOSIS — M1990 Unspecified osteoarthritis, unspecified site: Secondary | ICD-10-CM | POA: Diagnosis not present

## 2020-06-04 DIAGNOSIS — I509 Heart failure, unspecified: Secondary | ICD-10-CM | POA: Diagnosis not present

## 2020-06-06 DIAGNOSIS — G8929 Other chronic pain: Secondary | ICD-10-CM | POA: Diagnosis not present

## 2020-06-06 DIAGNOSIS — I509 Heart failure, unspecified: Secondary | ICD-10-CM | POA: Diagnosis not present

## 2020-06-06 DIAGNOSIS — M1990 Unspecified osteoarthritis, unspecified site: Secondary | ICD-10-CM | POA: Diagnosis not present

## 2020-06-06 DIAGNOSIS — I110 Hypertensive heart disease with heart failure: Secondary | ICD-10-CM | POA: Diagnosis not present

## 2020-06-06 DIAGNOSIS — R627 Adult failure to thrive: Secondary | ICD-10-CM | POA: Diagnosis not present

## 2020-06-06 DIAGNOSIS — Z853 Personal history of malignant neoplasm of breast: Secondary | ICD-10-CM | POA: Diagnosis not present

## 2020-06-09 DIAGNOSIS — I509 Heart failure, unspecified: Secondary | ICD-10-CM | POA: Diagnosis not present

## 2020-06-09 DIAGNOSIS — M1990 Unspecified osteoarthritis, unspecified site: Secondary | ICD-10-CM | POA: Diagnosis not present

## 2020-06-09 DIAGNOSIS — G8929 Other chronic pain: Secondary | ICD-10-CM | POA: Diagnosis not present

## 2020-06-09 DIAGNOSIS — I110 Hypertensive heart disease with heart failure: Secondary | ICD-10-CM | POA: Diagnosis not present

## 2020-06-09 DIAGNOSIS — R627 Adult failure to thrive: Secondary | ICD-10-CM | POA: Diagnosis not present

## 2020-06-09 DIAGNOSIS — Z853 Personal history of malignant neoplasm of breast: Secondary | ICD-10-CM | POA: Diagnosis not present

## 2020-06-10 DIAGNOSIS — I110 Hypertensive heart disease with heart failure: Secondary | ICD-10-CM | POA: Diagnosis not present

## 2020-06-10 DIAGNOSIS — M1990 Unspecified osteoarthritis, unspecified site: Secondary | ICD-10-CM | POA: Diagnosis not present

## 2020-06-10 DIAGNOSIS — Z853 Personal history of malignant neoplasm of breast: Secondary | ICD-10-CM | POA: Diagnosis not present

## 2020-06-10 DIAGNOSIS — R627 Adult failure to thrive: Secondary | ICD-10-CM | POA: Diagnosis not present

## 2020-06-10 DIAGNOSIS — G8929 Other chronic pain: Secondary | ICD-10-CM | POA: Diagnosis not present

## 2020-06-10 DIAGNOSIS — I509 Heart failure, unspecified: Secondary | ICD-10-CM | POA: Diagnosis not present

## 2020-06-11 DIAGNOSIS — Z853 Personal history of malignant neoplasm of breast: Secondary | ICD-10-CM | POA: Diagnosis not present

## 2020-06-11 DIAGNOSIS — I110 Hypertensive heart disease with heart failure: Secondary | ICD-10-CM | POA: Diagnosis not present

## 2020-06-11 DIAGNOSIS — G8929 Other chronic pain: Secondary | ICD-10-CM | POA: Diagnosis not present

## 2020-06-11 DIAGNOSIS — R627 Adult failure to thrive: Secondary | ICD-10-CM | POA: Diagnosis not present

## 2020-06-11 DIAGNOSIS — I509 Heart failure, unspecified: Secondary | ICD-10-CM | POA: Diagnosis not present

## 2020-06-11 DIAGNOSIS — M1990 Unspecified osteoarthritis, unspecified site: Secondary | ICD-10-CM | POA: Diagnosis not present

## 2020-06-13 DIAGNOSIS — M1990 Unspecified osteoarthritis, unspecified site: Secondary | ICD-10-CM | POA: Diagnosis not present

## 2020-06-13 DIAGNOSIS — I509 Heart failure, unspecified: Secondary | ICD-10-CM | POA: Diagnosis not present

## 2020-06-13 DIAGNOSIS — R627 Adult failure to thrive: Secondary | ICD-10-CM | POA: Diagnosis not present

## 2020-06-13 DIAGNOSIS — Z853 Personal history of malignant neoplasm of breast: Secondary | ICD-10-CM | POA: Diagnosis not present

## 2020-06-13 DIAGNOSIS — I110 Hypertensive heart disease with heart failure: Secondary | ICD-10-CM | POA: Diagnosis not present

## 2020-06-13 DIAGNOSIS — G8929 Other chronic pain: Secondary | ICD-10-CM | POA: Diagnosis not present

## 2020-06-17 DIAGNOSIS — M1990 Unspecified osteoarthritis, unspecified site: Secondary | ICD-10-CM | POA: Diagnosis not present

## 2020-06-17 DIAGNOSIS — R627 Adult failure to thrive: Secondary | ICD-10-CM | POA: Diagnosis not present

## 2020-06-17 DIAGNOSIS — Z853 Personal history of malignant neoplasm of breast: Secondary | ICD-10-CM | POA: Diagnosis not present

## 2020-06-17 DIAGNOSIS — I509 Heart failure, unspecified: Secondary | ICD-10-CM | POA: Diagnosis not present

## 2020-06-17 DIAGNOSIS — I110 Hypertensive heart disease with heart failure: Secondary | ICD-10-CM | POA: Diagnosis not present

## 2020-06-17 DIAGNOSIS — G8929 Other chronic pain: Secondary | ICD-10-CM | POA: Diagnosis not present

## 2020-06-18 DIAGNOSIS — M1990 Unspecified osteoarthritis, unspecified site: Secondary | ICD-10-CM | POA: Diagnosis not present

## 2020-06-18 DIAGNOSIS — G8929 Other chronic pain: Secondary | ICD-10-CM | POA: Diagnosis not present

## 2020-06-18 DIAGNOSIS — I509 Heart failure, unspecified: Secondary | ICD-10-CM | POA: Diagnosis not present

## 2020-06-18 DIAGNOSIS — R627 Adult failure to thrive: Secondary | ICD-10-CM | POA: Diagnosis not present

## 2020-06-18 DIAGNOSIS — I110 Hypertensive heart disease with heart failure: Secondary | ICD-10-CM | POA: Diagnosis not present

## 2020-06-18 DIAGNOSIS — Z853 Personal history of malignant neoplasm of breast: Secondary | ICD-10-CM | POA: Diagnosis not present

## 2020-06-19 DIAGNOSIS — M1990 Unspecified osteoarthritis, unspecified site: Secondary | ICD-10-CM | POA: Diagnosis not present

## 2020-06-19 DIAGNOSIS — R627 Adult failure to thrive: Secondary | ICD-10-CM | POA: Diagnosis not present

## 2020-06-19 DIAGNOSIS — I110 Hypertensive heart disease with heart failure: Secondary | ICD-10-CM | POA: Diagnosis not present

## 2020-06-19 DIAGNOSIS — G8929 Other chronic pain: Secondary | ICD-10-CM | POA: Diagnosis not present

## 2020-06-19 DIAGNOSIS — Z853 Personal history of malignant neoplasm of breast: Secondary | ICD-10-CM | POA: Diagnosis not present

## 2020-06-19 DIAGNOSIS — I509 Heart failure, unspecified: Secondary | ICD-10-CM | POA: Diagnosis not present

## 2020-06-20 DIAGNOSIS — I110 Hypertensive heart disease with heart failure: Secondary | ICD-10-CM | POA: Diagnosis not present

## 2020-06-20 DIAGNOSIS — G8929 Other chronic pain: Secondary | ICD-10-CM | POA: Diagnosis not present

## 2020-06-20 DIAGNOSIS — R627 Adult failure to thrive: Secondary | ICD-10-CM | POA: Diagnosis not present

## 2020-06-20 DIAGNOSIS — I509 Heart failure, unspecified: Secondary | ICD-10-CM | POA: Diagnosis not present

## 2020-06-20 DIAGNOSIS — Z853 Personal history of malignant neoplasm of breast: Secondary | ICD-10-CM | POA: Diagnosis not present

## 2020-06-20 DIAGNOSIS — M1990 Unspecified osteoarthritis, unspecified site: Secondary | ICD-10-CM | POA: Diagnosis not present

## 2020-06-23 DIAGNOSIS — I509 Heart failure, unspecified: Secondary | ICD-10-CM | POA: Diagnosis not present

## 2020-06-23 DIAGNOSIS — R627 Adult failure to thrive: Secondary | ICD-10-CM | POA: Diagnosis not present

## 2020-06-23 DIAGNOSIS — G8929 Other chronic pain: Secondary | ICD-10-CM | POA: Diagnosis not present

## 2020-06-23 DIAGNOSIS — I110 Hypertensive heart disease with heart failure: Secondary | ICD-10-CM | POA: Diagnosis not present

## 2020-06-23 DIAGNOSIS — M1990 Unspecified osteoarthritis, unspecified site: Secondary | ICD-10-CM | POA: Diagnosis not present

## 2020-06-23 DIAGNOSIS — Z853 Personal history of malignant neoplasm of breast: Secondary | ICD-10-CM | POA: Diagnosis not present

## 2020-06-24 DIAGNOSIS — Z853 Personal history of malignant neoplasm of breast: Secondary | ICD-10-CM | POA: Diagnosis not present

## 2020-06-24 DIAGNOSIS — M1990 Unspecified osteoarthritis, unspecified site: Secondary | ICD-10-CM | POA: Diagnosis not present

## 2020-06-24 DIAGNOSIS — I509 Heart failure, unspecified: Secondary | ICD-10-CM | POA: Diagnosis not present

## 2020-06-24 DIAGNOSIS — R627 Adult failure to thrive: Secondary | ICD-10-CM | POA: Diagnosis not present

## 2020-06-24 DIAGNOSIS — I110 Hypertensive heart disease with heart failure: Secondary | ICD-10-CM | POA: Diagnosis not present

## 2020-06-24 DIAGNOSIS — G8929 Other chronic pain: Secondary | ICD-10-CM | POA: Diagnosis not present

## 2020-06-25 DIAGNOSIS — Z853 Personal history of malignant neoplasm of breast: Secondary | ICD-10-CM | POA: Diagnosis not present

## 2020-06-25 DIAGNOSIS — M1990 Unspecified osteoarthritis, unspecified site: Secondary | ICD-10-CM | POA: Diagnosis not present

## 2020-06-25 DIAGNOSIS — G8929 Other chronic pain: Secondary | ICD-10-CM | POA: Diagnosis not present

## 2020-06-25 DIAGNOSIS — I509 Heart failure, unspecified: Secondary | ICD-10-CM | POA: Diagnosis not present

## 2020-06-25 DIAGNOSIS — I110 Hypertensive heart disease with heart failure: Secondary | ICD-10-CM | POA: Diagnosis not present

## 2020-06-25 DIAGNOSIS — R627 Adult failure to thrive: Secondary | ICD-10-CM | POA: Diagnosis not present

## 2020-06-26 DIAGNOSIS — I110 Hypertensive heart disease with heart failure: Secondary | ICD-10-CM | POA: Diagnosis not present

## 2020-06-26 DIAGNOSIS — G8929 Other chronic pain: Secondary | ICD-10-CM | POA: Diagnosis not present

## 2020-06-26 DIAGNOSIS — M1990 Unspecified osteoarthritis, unspecified site: Secondary | ICD-10-CM | POA: Diagnosis not present

## 2020-06-26 DIAGNOSIS — R627 Adult failure to thrive: Secondary | ICD-10-CM | POA: Diagnosis not present

## 2020-06-26 DIAGNOSIS — I509 Heart failure, unspecified: Secondary | ICD-10-CM | POA: Diagnosis not present

## 2020-06-26 DIAGNOSIS — Z853 Personal history of malignant neoplasm of breast: Secondary | ICD-10-CM | POA: Diagnosis not present

## 2020-06-27 DIAGNOSIS — G8929 Other chronic pain: Secondary | ICD-10-CM | POA: Diagnosis not present

## 2020-06-27 DIAGNOSIS — Z853 Personal history of malignant neoplasm of breast: Secondary | ICD-10-CM | POA: Diagnosis not present

## 2020-06-27 DIAGNOSIS — R627 Adult failure to thrive: Secondary | ICD-10-CM | POA: Diagnosis not present

## 2020-06-27 DIAGNOSIS — I509 Heart failure, unspecified: Secondary | ICD-10-CM | POA: Diagnosis not present

## 2020-06-27 DIAGNOSIS — M1990 Unspecified osteoarthritis, unspecified site: Secondary | ICD-10-CM | POA: Diagnosis not present

## 2020-06-27 DIAGNOSIS — I110 Hypertensive heart disease with heart failure: Secondary | ICD-10-CM | POA: Diagnosis not present

## 2020-06-30 DIAGNOSIS — Z853 Personal history of malignant neoplasm of breast: Secondary | ICD-10-CM | POA: Diagnosis not present

## 2020-06-30 DIAGNOSIS — M1990 Unspecified osteoarthritis, unspecified site: Secondary | ICD-10-CM | POA: Diagnosis not present

## 2020-06-30 DIAGNOSIS — I509 Heart failure, unspecified: Secondary | ICD-10-CM | POA: Diagnosis not present

## 2020-06-30 DIAGNOSIS — R627 Adult failure to thrive: Secondary | ICD-10-CM | POA: Diagnosis not present

## 2020-06-30 DIAGNOSIS — I110 Hypertensive heart disease with heart failure: Secondary | ICD-10-CM | POA: Diagnosis not present

## 2020-06-30 DIAGNOSIS — G8929 Other chronic pain: Secondary | ICD-10-CM | POA: Diagnosis not present

## 2020-07-01 DIAGNOSIS — E039 Hypothyroidism, unspecified: Secondary | ICD-10-CM | POA: Diagnosis not present

## 2020-07-01 DIAGNOSIS — Z87440 Personal history of urinary (tract) infections: Secondary | ICD-10-CM | POA: Diagnosis not present

## 2020-07-01 DIAGNOSIS — R627 Adult failure to thrive: Secondary | ICD-10-CM | POA: Diagnosis not present

## 2020-07-01 DIAGNOSIS — G8929 Other chronic pain: Secondary | ICD-10-CM | POA: Diagnosis not present

## 2020-07-01 DIAGNOSIS — Z853 Personal history of malignant neoplasm of breast: Secondary | ICD-10-CM | POA: Diagnosis not present

## 2020-07-01 DIAGNOSIS — I509 Heart failure, unspecified: Secondary | ICD-10-CM | POA: Diagnosis not present

## 2020-07-01 DIAGNOSIS — K5900 Constipation, unspecified: Secondary | ICD-10-CM | POA: Diagnosis not present

## 2020-07-01 DIAGNOSIS — M549 Dorsalgia, unspecified: Secondary | ICD-10-CM | POA: Diagnosis not present

## 2020-07-01 DIAGNOSIS — I110 Hypertensive heart disease with heart failure: Secondary | ICD-10-CM | POA: Diagnosis not present

## 2020-07-01 DIAGNOSIS — M1990 Unspecified osteoarthritis, unspecified site: Secondary | ICD-10-CM | POA: Diagnosis not present

## 2020-07-02 DIAGNOSIS — Z853 Personal history of malignant neoplasm of breast: Secondary | ICD-10-CM | POA: Diagnosis not present

## 2020-07-02 DIAGNOSIS — I509 Heart failure, unspecified: Secondary | ICD-10-CM | POA: Diagnosis not present

## 2020-07-02 DIAGNOSIS — G8929 Other chronic pain: Secondary | ICD-10-CM | POA: Diagnosis not present

## 2020-07-02 DIAGNOSIS — M1990 Unspecified osteoarthritis, unspecified site: Secondary | ICD-10-CM | POA: Diagnosis not present

## 2020-07-02 DIAGNOSIS — I110 Hypertensive heart disease with heart failure: Secondary | ICD-10-CM | POA: Diagnosis not present

## 2020-07-02 DIAGNOSIS — R627 Adult failure to thrive: Secondary | ICD-10-CM | POA: Diagnosis not present

## 2020-07-03 DIAGNOSIS — M1990 Unspecified osteoarthritis, unspecified site: Secondary | ICD-10-CM | POA: Diagnosis not present

## 2020-07-03 DIAGNOSIS — Z853 Personal history of malignant neoplasm of breast: Secondary | ICD-10-CM | POA: Diagnosis not present

## 2020-07-03 DIAGNOSIS — I110 Hypertensive heart disease with heart failure: Secondary | ICD-10-CM | POA: Diagnosis not present

## 2020-07-03 DIAGNOSIS — G8929 Other chronic pain: Secondary | ICD-10-CM | POA: Diagnosis not present

## 2020-07-03 DIAGNOSIS — I509 Heart failure, unspecified: Secondary | ICD-10-CM | POA: Diagnosis not present

## 2020-07-03 DIAGNOSIS — R627 Adult failure to thrive: Secondary | ICD-10-CM | POA: Diagnosis not present

## 2020-07-04 DIAGNOSIS — M1990 Unspecified osteoarthritis, unspecified site: Secondary | ICD-10-CM | POA: Diagnosis not present

## 2020-07-04 DIAGNOSIS — R627 Adult failure to thrive: Secondary | ICD-10-CM | POA: Diagnosis not present

## 2020-07-04 DIAGNOSIS — Z853 Personal history of malignant neoplasm of breast: Secondary | ICD-10-CM | POA: Diagnosis not present

## 2020-07-04 DIAGNOSIS — G8929 Other chronic pain: Secondary | ICD-10-CM | POA: Diagnosis not present

## 2020-07-04 DIAGNOSIS — I110 Hypertensive heart disease with heart failure: Secondary | ICD-10-CM | POA: Diagnosis not present

## 2020-07-04 DIAGNOSIS — I509 Heart failure, unspecified: Secondary | ICD-10-CM | POA: Diagnosis not present

## 2020-07-07 DIAGNOSIS — G8929 Other chronic pain: Secondary | ICD-10-CM | POA: Diagnosis not present

## 2020-07-07 DIAGNOSIS — I110 Hypertensive heart disease with heart failure: Secondary | ICD-10-CM | POA: Diagnosis not present

## 2020-07-07 DIAGNOSIS — Z853 Personal history of malignant neoplasm of breast: Secondary | ICD-10-CM | POA: Diagnosis not present

## 2020-07-07 DIAGNOSIS — M1990 Unspecified osteoarthritis, unspecified site: Secondary | ICD-10-CM | POA: Diagnosis not present

## 2020-07-07 DIAGNOSIS — R627 Adult failure to thrive: Secondary | ICD-10-CM | POA: Diagnosis not present

## 2020-07-07 DIAGNOSIS — I509 Heart failure, unspecified: Secondary | ICD-10-CM | POA: Diagnosis not present

## 2020-07-08 DIAGNOSIS — I509 Heart failure, unspecified: Secondary | ICD-10-CM | POA: Diagnosis not present

## 2020-07-08 DIAGNOSIS — I110 Hypertensive heart disease with heart failure: Secondary | ICD-10-CM | POA: Diagnosis not present

## 2020-07-08 DIAGNOSIS — Z853 Personal history of malignant neoplasm of breast: Secondary | ICD-10-CM | POA: Diagnosis not present

## 2020-07-08 DIAGNOSIS — R627 Adult failure to thrive: Secondary | ICD-10-CM | POA: Diagnosis not present

## 2020-07-08 DIAGNOSIS — M1990 Unspecified osteoarthritis, unspecified site: Secondary | ICD-10-CM | POA: Diagnosis not present

## 2020-07-08 DIAGNOSIS — G8929 Other chronic pain: Secondary | ICD-10-CM | POA: Diagnosis not present

## 2020-07-09 DIAGNOSIS — R627 Adult failure to thrive: Secondary | ICD-10-CM | POA: Diagnosis not present

## 2020-07-09 DIAGNOSIS — G8929 Other chronic pain: Secondary | ICD-10-CM | POA: Diagnosis not present

## 2020-07-09 DIAGNOSIS — Z853 Personal history of malignant neoplasm of breast: Secondary | ICD-10-CM | POA: Diagnosis not present

## 2020-07-09 DIAGNOSIS — M1990 Unspecified osteoarthritis, unspecified site: Secondary | ICD-10-CM | POA: Diagnosis not present

## 2020-07-09 DIAGNOSIS — I509 Heart failure, unspecified: Secondary | ICD-10-CM | POA: Diagnosis not present

## 2020-07-09 DIAGNOSIS — I110 Hypertensive heart disease with heart failure: Secondary | ICD-10-CM | POA: Diagnosis not present

## 2020-07-11 DIAGNOSIS — G8929 Other chronic pain: Secondary | ICD-10-CM | POA: Diagnosis not present

## 2020-07-11 DIAGNOSIS — I509 Heart failure, unspecified: Secondary | ICD-10-CM | POA: Diagnosis not present

## 2020-07-11 DIAGNOSIS — I110 Hypertensive heart disease with heart failure: Secondary | ICD-10-CM | POA: Diagnosis not present

## 2020-07-11 DIAGNOSIS — M1990 Unspecified osteoarthritis, unspecified site: Secondary | ICD-10-CM | POA: Diagnosis not present

## 2020-07-11 DIAGNOSIS — Z853 Personal history of malignant neoplasm of breast: Secondary | ICD-10-CM | POA: Diagnosis not present

## 2020-07-11 DIAGNOSIS — R627 Adult failure to thrive: Secondary | ICD-10-CM | POA: Diagnosis not present

## 2020-07-14 DIAGNOSIS — G8929 Other chronic pain: Secondary | ICD-10-CM | POA: Diagnosis not present

## 2020-07-14 DIAGNOSIS — Z853 Personal history of malignant neoplasm of breast: Secondary | ICD-10-CM | POA: Diagnosis not present

## 2020-07-14 DIAGNOSIS — R627 Adult failure to thrive: Secondary | ICD-10-CM | POA: Diagnosis not present

## 2020-07-14 DIAGNOSIS — M1990 Unspecified osteoarthritis, unspecified site: Secondary | ICD-10-CM | POA: Diagnosis not present

## 2020-07-14 DIAGNOSIS — I110 Hypertensive heart disease with heart failure: Secondary | ICD-10-CM | POA: Diagnosis not present

## 2020-07-14 DIAGNOSIS — I509 Heart failure, unspecified: Secondary | ICD-10-CM | POA: Diagnosis not present

## 2020-07-15 DIAGNOSIS — I509 Heart failure, unspecified: Secondary | ICD-10-CM | POA: Diagnosis not present

## 2020-07-15 DIAGNOSIS — I110 Hypertensive heart disease with heart failure: Secondary | ICD-10-CM | POA: Diagnosis not present

## 2020-07-15 DIAGNOSIS — M1990 Unspecified osteoarthritis, unspecified site: Secondary | ICD-10-CM | POA: Diagnosis not present

## 2020-07-15 DIAGNOSIS — R627 Adult failure to thrive: Secondary | ICD-10-CM | POA: Diagnosis not present

## 2020-07-15 DIAGNOSIS — Z853 Personal history of malignant neoplasm of breast: Secondary | ICD-10-CM | POA: Diagnosis not present

## 2020-07-15 DIAGNOSIS — G8929 Other chronic pain: Secondary | ICD-10-CM | POA: Diagnosis not present

## 2020-07-16 DIAGNOSIS — I509 Heart failure, unspecified: Secondary | ICD-10-CM | POA: Diagnosis not present

## 2020-07-16 DIAGNOSIS — M1990 Unspecified osteoarthritis, unspecified site: Secondary | ICD-10-CM | POA: Diagnosis not present

## 2020-07-16 DIAGNOSIS — R627 Adult failure to thrive: Secondary | ICD-10-CM | POA: Diagnosis not present

## 2020-07-16 DIAGNOSIS — I110 Hypertensive heart disease with heart failure: Secondary | ICD-10-CM | POA: Diagnosis not present

## 2020-07-16 DIAGNOSIS — Z853 Personal history of malignant neoplasm of breast: Secondary | ICD-10-CM | POA: Diagnosis not present

## 2020-07-16 DIAGNOSIS — G8929 Other chronic pain: Secondary | ICD-10-CM | POA: Diagnosis not present

## 2020-07-18 DIAGNOSIS — R627 Adult failure to thrive: Secondary | ICD-10-CM | POA: Diagnosis not present

## 2020-07-18 DIAGNOSIS — G8929 Other chronic pain: Secondary | ICD-10-CM | POA: Diagnosis not present

## 2020-07-18 DIAGNOSIS — M1990 Unspecified osteoarthritis, unspecified site: Secondary | ICD-10-CM | POA: Diagnosis not present

## 2020-07-18 DIAGNOSIS — Z853 Personal history of malignant neoplasm of breast: Secondary | ICD-10-CM | POA: Diagnosis not present

## 2020-07-18 DIAGNOSIS — I110 Hypertensive heart disease with heart failure: Secondary | ICD-10-CM | POA: Diagnosis not present

## 2020-07-18 DIAGNOSIS — I509 Heart failure, unspecified: Secondary | ICD-10-CM | POA: Diagnosis not present

## 2020-07-19 DIAGNOSIS — I110 Hypertensive heart disease with heart failure: Secondary | ICD-10-CM | POA: Diagnosis not present

## 2020-07-19 DIAGNOSIS — R627 Adult failure to thrive: Secondary | ICD-10-CM | POA: Diagnosis not present

## 2020-07-19 DIAGNOSIS — G8929 Other chronic pain: Secondary | ICD-10-CM | POA: Diagnosis not present

## 2020-07-19 DIAGNOSIS — Z853 Personal history of malignant neoplasm of breast: Secondary | ICD-10-CM | POA: Diagnosis not present

## 2020-07-19 DIAGNOSIS — M1990 Unspecified osteoarthritis, unspecified site: Secondary | ICD-10-CM | POA: Diagnosis not present

## 2020-07-19 DIAGNOSIS — I509 Heart failure, unspecified: Secondary | ICD-10-CM | POA: Diagnosis not present

## 2020-07-21 DIAGNOSIS — I509 Heart failure, unspecified: Secondary | ICD-10-CM | POA: Diagnosis not present

## 2020-07-21 DIAGNOSIS — R627 Adult failure to thrive: Secondary | ICD-10-CM | POA: Diagnosis not present

## 2020-07-21 DIAGNOSIS — Z853 Personal history of malignant neoplasm of breast: Secondary | ICD-10-CM | POA: Diagnosis not present

## 2020-07-21 DIAGNOSIS — G8929 Other chronic pain: Secondary | ICD-10-CM | POA: Diagnosis not present

## 2020-07-21 DIAGNOSIS — I110 Hypertensive heart disease with heart failure: Secondary | ICD-10-CM | POA: Diagnosis not present

## 2020-07-21 DIAGNOSIS — M1990 Unspecified osteoarthritis, unspecified site: Secondary | ICD-10-CM | POA: Diagnosis not present

## 2020-07-22 DIAGNOSIS — Z853 Personal history of malignant neoplasm of breast: Secondary | ICD-10-CM | POA: Diagnosis not present

## 2020-07-22 DIAGNOSIS — M1990 Unspecified osteoarthritis, unspecified site: Secondary | ICD-10-CM | POA: Diagnosis not present

## 2020-07-22 DIAGNOSIS — R627 Adult failure to thrive: Secondary | ICD-10-CM | POA: Diagnosis not present

## 2020-07-22 DIAGNOSIS — I110 Hypertensive heart disease with heart failure: Secondary | ICD-10-CM | POA: Diagnosis not present

## 2020-07-22 DIAGNOSIS — I509 Heart failure, unspecified: Secondary | ICD-10-CM | POA: Diagnosis not present

## 2020-07-22 DIAGNOSIS — G8929 Other chronic pain: Secondary | ICD-10-CM | POA: Diagnosis not present

## 2020-07-23 DIAGNOSIS — Z853 Personal history of malignant neoplasm of breast: Secondary | ICD-10-CM | POA: Diagnosis not present

## 2020-07-23 DIAGNOSIS — I110 Hypertensive heart disease with heart failure: Secondary | ICD-10-CM | POA: Diagnosis not present

## 2020-07-23 DIAGNOSIS — G8929 Other chronic pain: Secondary | ICD-10-CM | POA: Diagnosis not present

## 2020-07-23 DIAGNOSIS — I509 Heart failure, unspecified: Secondary | ICD-10-CM | POA: Diagnosis not present

## 2020-07-23 DIAGNOSIS — R627 Adult failure to thrive: Secondary | ICD-10-CM | POA: Diagnosis not present

## 2020-07-23 DIAGNOSIS — M1990 Unspecified osteoarthritis, unspecified site: Secondary | ICD-10-CM | POA: Diagnosis not present

## 2020-07-25 DIAGNOSIS — R627 Adult failure to thrive: Secondary | ICD-10-CM | POA: Diagnosis not present

## 2020-07-25 DIAGNOSIS — I110 Hypertensive heart disease with heart failure: Secondary | ICD-10-CM | POA: Diagnosis not present

## 2020-07-25 DIAGNOSIS — I509 Heart failure, unspecified: Secondary | ICD-10-CM | POA: Diagnosis not present

## 2020-07-25 DIAGNOSIS — Z853 Personal history of malignant neoplasm of breast: Secondary | ICD-10-CM | POA: Diagnosis not present

## 2020-07-25 DIAGNOSIS — G8929 Other chronic pain: Secondary | ICD-10-CM | POA: Diagnosis not present

## 2020-07-25 DIAGNOSIS — M1990 Unspecified osteoarthritis, unspecified site: Secondary | ICD-10-CM | POA: Diagnosis not present

## 2020-07-28 DIAGNOSIS — G8929 Other chronic pain: Secondary | ICD-10-CM | POA: Diagnosis not present

## 2020-07-28 DIAGNOSIS — I110 Hypertensive heart disease with heart failure: Secondary | ICD-10-CM | POA: Diagnosis not present

## 2020-07-28 DIAGNOSIS — Z853 Personal history of malignant neoplasm of breast: Secondary | ICD-10-CM | POA: Diagnosis not present

## 2020-07-28 DIAGNOSIS — M1990 Unspecified osteoarthritis, unspecified site: Secondary | ICD-10-CM | POA: Diagnosis not present

## 2020-07-28 DIAGNOSIS — R627 Adult failure to thrive: Secondary | ICD-10-CM | POA: Diagnosis not present

## 2020-07-28 DIAGNOSIS — I509 Heart failure, unspecified: Secondary | ICD-10-CM | POA: Diagnosis not present

## 2020-07-29 DIAGNOSIS — E039 Hypothyroidism, unspecified: Secondary | ICD-10-CM | POA: Diagnosis not present

## 2020-07-29 DIAGNOSIS — G8929 Other chronic pain: Secondary | ICD-10-CM | POA: Diagnosis not present

## 2020-07-29 DIAGNOSIS — I110 Hypertensive heart disease with heart failure: Secondary | ICD-10-CM | POA: Diagnosis not present

## 2020-07-29 DIAGNOSIS — M549 Dorsalgia, unspecified: Secondary | ICD-10-CM | POA: Diagnosis not present

## 2020-07-29 DIAGNOSIS — I509 Heart failure, unspecified: Secondary | ICD-10-CM | POA: Diagnosis not present

## 2020-07-29 DIAGNOSIS — R627 Adult failure to thrive: Secondary | ICD-10-CM | POA: Diagnosis not present

## 2020-07-29 DIAGNOSIS — Z87440 Personal history of urinary (tract) infections: Secondary | ICD-10-CM | POA: Diagnosis not present

## 2020-07-29 DIAGNOSIS — Z853 Personal history of malignant neoplasm of breast: Secondary | ICD-10-CM | POA: Diagnosis not present

## 2020-07-29 DIAGNOSIS — M1990 Unspecified osteoarthritis, unspecified site: Secondary | ICD-10-CM | POA: Diagnosis not present

## 2020-07-29 DIAGNOSIS — K5900 Constipation, unspecified: Secondary | ICD-10-CM | POA: Diagnosis not present

## 2020-07-30 DIAGNOSIS — M1990 Unspecified osteoarthritis, unspecified site: Secondary | ICD-10-CM | POA: Diagnosis not present

## 2020-07-30 DIAGNOSIS — G8929 Other chronic pain: Secondary | ICD-10-CM | POA: Diagnosis not present

## 2020-07-30 DIAGNOSIS — I110 Hypertensive heart disease with heart failure: Secondary | ICD-10-CM | POA: Diagnosis not present

## 2020-07-30 DIAGNOSIS — I509 Heart failure, unspecified: Secondary | ICD-10-CM | POA: Diagnosis not present

## 2020-07-30 DIAGNOSIS — Z853 Personal history of malignant neoplasm of breast: Secondary | ICD-10-CM | POA: Diagnosis not present

## 2020-07-30 DIAGNOSIS — R627 Adult failure to thrive: Secondary | ICD-10-CM | POA: Diagnosis not present

## 2020-08-01 DIAGNOSIS — I509 Heart failure, unspecified: Secondary | ICD-10-CM | POA: Diagnosis not present

## 2020-08-01 DIAGNOSIS — R627 Adult failure to thrive: Secondary | ICD-10-CM | POA: Diagnosis not present

## 2020-08-01 DIAGNOSIS — I110 Hypertensive heart disease with heart failure: Secondary | ICD-10-CM | POA: Diagnosis not present

## 2020-08-01 DIAGNOSIS — Z853 Personal history of malignant neoplasm of breast: Secondary | ICD-10-CM | POA: Diagnosis not present

## 2020-08-01 DIAGNOSIS — G8929 Other chronic pain: Secondary | ICD-10-CM | POA: Diagnosis not present

## 2020-08-01 DIAGNOSIS — M1990 Unspecified osteoarthritis, unspecified site: Secondary | ICD-10-CM | POA: Diagnosis not present

## 2020-08-04 DIAGNOSIS — G8929 Other chronic pain: Secondary | ICD-10-CM | POA: Diagnosis not present

## 2020-08-04 DIAGNOSIS — Z853 Personal history of malignant neoplasm of breast: Secondary | ICD-10-CM | POA: Diagnosis not present

## 2020-08-04 DIAGNOSIS — I110 Hypertensive heart disease with heart failure: Secondary | ICD-10-CM | POA: Diagnosis not present

## 2020-08-04 DIAGNOSIS — R627 Adult failure to thrive: Secondary | ICD-10-CM | POA: Diagnosis not present

## 2020-08-04 DIAGNOSIS — I509 Heart failure, unspecified: Secondary | ICD-10-CM | POA: Diagnosis not present

## 2020-08-04 DIAGNOSIS — M1990 Unspecified osteoarthritis, unspecified site: Secondary | ICD-10-CM | POA: Diagnosis not present

## 2020-08-05 DIAGNOSIS — R627 Adult failure to thrive: Secondary | ICD-10-CM | POA: Diagnosis not present

## 2020-08-05 DIAGNOSIS — I110 Hypertensive heart disease with heart failure: Secondary | ICD-10-CM | POA: Diagnosis not present

## 2020-08-05 DIAGNOSIS — Z853 Personal history of malignant neoplasm of breast: Secondary | ICD-10-CM | POA: Diagnosis not present

## 2020-08-05 DIAGNOSIS — M1990 Unspecified osteoarthritis, unspecified site: Secondary | ICD-10-CM | POA: Diagnosis not present

## 2020-08-05 DIAGNOSIS — G8929 Other chronic pain: Secondary | ICD-10-CM | POA: Diagnosis not present

## 2020-08-05 DIAGNOSIS — I509 Heart failure, unspecified: Secondary | ICD-10-CM | POA: Diagnosis not present

## 2020-08-06 DIAGNOSIS — I110 Hypertensive heart disease with heart failure: Secondary | ICD-10-CM | POA: Diagnosis not present

## 2020-08-06 DIAGNOSIS — Z853 Personal history of malignant neoplasm of breast: Secondary | ICD-10-CM | POA: Diagnosis not present

## 2020-08-06 DIAGNOSIS — M1990 Unspecified osteoarthritis, unspecified site: Secondary | ICD-10-CM | POA: Diagnosis not present

## 2020-08-06 DIAGNOSIS — G8929 Other chronic pain: Secondary | ICD-10-CM | POA: Diagnosis not present

## 2020-08-06 DIAGNOSIS — R627 Adult failure to thrive: Secondary | ICD-10-CM | POA: Diagnosis not present

## 2020-08-06 DIAGNOSIS — I509 Heart failure, unspecified: Secondary | ICD-10-CM | POA: Diagnosis not present

## 2020-08-08 DIAGNOSIS — Z853 Personal history of malignant neoplasm of breast: Secondary | ICD-10-CM | POA: Diagnosis not present

## 2020-08-08 DIAGNOSIS — M1990 Unspecified osteoarthritis, unspecified site: Secondary | ICD-10-CM | POA: Diagnosis not present

## 2020-08-08 DIAGNOSIS — I509 Heart failure, unspecified: Secondary | ICD-10-CM | POA: Diagnosis not present

## 2020-08-08 DIAGNOSIS — I110 Hypertensive heart disease with heart failure: Secondary | ICD-10-CM | POA: Diagnosis not present

## 2020-08-08 DIAGNOSIS — G8929 Other chronic pain: Secondary | ICD-10-CM | POA: Diagnosis not present

## 2020-08-08 DIAGNOSIS — R627 Adult failure to thrive: Secondary | ICD-10-CM | POA: Diagnosis not present

## 2020-08-11 DIAGNOSIS — R627 Adult failure to thrive: Secondary | ICD-10-CM | POA: Diagnosis not present

## 2020-08-11 DIAGNOSIS — I509 Heart failure, unspecified: Secondary | ICD-10-CM | POA: Diagnosis not present

## 2020-08-11 DIAGNOSIS — Z853 Personal history of malignant neoplasm of breast: Secondary | ICD-10-CM | POA: Diagnosis not present

## 2020-08-11 DIAGNOSIS — G8929 Other chronic pain: Secondary | ICD-10-CM | POA: Diagnosis not present

## 2020-08-11 DIAGNOSIS — I110 Hypertensive heart disease with heart failure: Secondary | ICD-10-CM | POA: Diagnosis not present

## 2020-08-11 DIAGNOSIS — M1990 Unspecified osteoarthritis, unspecified site: Secondary | ICD-10-CM | POA: Diagnosis not present

## 2020-08-12 DIAGNOSIS — R627 Adult failure to thrive: Secondary | ICD-10-CM | POA: Diagnosis not present

## 2020-08-12 DIAGNOSIS — M1990 Unspecified osteoarthritis, unspecified site: Secondary | ICD-10-CM | POA: Diagnosis not present

## 2020-08-12 DIAGNOSIS — Z853 Personal history of malignant neoplasm of breast: Secondary | ICD-10-CM | POA: Diagnosis not present

## 2020-08-12 DIAGNOSIS — G8929 Other chronic pain: Secondary | ICD-10-CM | POA: Diagnosis not present

## 2020-08-12 DIAGNOSIS — I509 Heart failure, unspecified: Secondary | ICD-10-CM | POA: Diagnosis not present

## 2020-08-12 DIAGNOSIS — I110 Hypertensive heart disease with heart failure: Secondary | ICD-10-CM | POA: Diagnosis not present

## 2020-08-13 DIAGNOSIS — I110 Hypertensive heart disease with heart failure: Secondary | ICD-10-CM | POA: Diagnosis not present

## 2020-08-13 DIAGNOSIS — Z853 Personal history of malignant neoplasm of breast: Secondary | ICD-10-CM | POA: Diagnosis not present

## 2020-08-13 DIAGNOSIS — M1990 Unspecified osteoarthritis, unspecified site: Secondary | ICD-10-CM | POA: Diagnosis not present

## 2020-08-13 DIAGNOSIS — R627 Adult failure to thrive: Secondary | ICD-10-CM | POA: Diagnosis not present

## 2020-08-13 DIAGNOSIS — I509 Heart failure, unspecified: Secondary | ICD-10-CM | POA: Diagnosis not present

## 2020-08-13 DIAGNOSIS — G8929 Other chronic pain: Secondary | ICD-10-CM | POA: Diagnosis not present

## 2020-08-14 DIAGNOSIS — R627 Adult failure to thrive: Secondary | ICD-10-CM | POA: Diagnosis not present

## 2020-08-14 DIAGNOSIS — I509 Heart failure, unspecified: Secondary | ICD-10-CM | POA: Diagnosis not present

## 2020-08-14 DIAGNOSIS — I110 Hypertensive heart disease with heart failure: Secondary | ICD-10-CM | POA: Diagnosis not present

## 2020-08-14 DIAGNOSIS — G8929 Other chronic pain: Secondary | ICD-10-CM | POA: Diagnosis not present

## 2020-08-14 DIAGNOSIS — Z853 Personal history of malignant neoplasm of breast: Secondary | ICD-10-CM | POA: Diagnosis not present

## 2020-08-14 DIAGNOSIS — M1990 Unspecified osteoarthritis, unspecified site: Secondary | ICD-10-CM | POA: Diagnosis not present

## 2020-08-15 DIAGNOSIS — G8929 Other chronic pain: Secondary | ICD-10-CM | POA: Diagnosis not present

## 2020-08-15 DIAGNOSIS — Z853 Personal history of malignant neoplasm of breast: Secondary | ICD-10-CM | POA: Diagnosis not present

## 2020-08-15 DIAGNOSIS — R627 Adult failure to thrive: Secondary | ICD-10-CM | POA: Diagnosis not present

## 2020-08-15 DIAGNOSIS — I509 Heart failure, unspecified: Secondary | ICD-10-CM | POA: Diagnosis not present

## 2020-08-15 DIAGNOSIS — I110 Hypertensive heart disease with heart failure: Secondary | ICD-10-CM | POA: Diagnosis not present

## 2020-08-15 DIAGNOSIS — M1990 Unspecified osteoarthritis, unspecified site: Secondary | ICD-10-CM | POA: Diagnosis not present

## 2020-08-18 DIAGNOSIS — M1990 Unspecified osteoarthritis, unspecified site: Secondary | ICD-10-CM | POA: Diagnosis not present

## 2020-08-18 DIAGNOSIS — I110 Hypertensive heart disease with heart failure: Secondary | ICD-10-CM | POA: Diagnosis not present

## 2020-08-18 DIAGNOSIS — Z853 Personal history of malignant neoplasm of breast: Secondary | ICD-10-CM | POA: Diagnosis not present

## 2020-08-18 DIAGNOSIS — R627 Adult failure to thrive: Secondary | ICD-10-CM | POA: Diagnosis not present

## 2020-08-18 DIAGNOSIS — I509 Heart failure, unspecified: Secondary | ICD-10-CM | POA: Diagnosis not present

## 2020-08-18 DIAGNOSIS — G8929 Other chronic pain: Secondary | ICD-10-CM | POA: Diagnosis not present

## 2020-08-19 DIAGNOSIS — R627 Adult failure to thrive: Secondary | ICD-10-CM | POA: Diagnosis not present

## 2020-08-19 DIAGNOSIS — I509 Heart failure, unspecified: Secondary | ICD-10-CM | POA: Diagnosis not present

## 2020-08-19 DIAGNOSIS — I110 Hypertensive heart disease with heart failure: Secondary | ICD-10-CM | POA: Diagnosis not present

## 2020-08-19 DIAGNOSIS — Z853 Personal history of malignant neoplasm of breast: Secondary | ICD-10-CM | POA: Diagnosis not present

## 2020-08-19 DIAGNOSIS — G8929 Other chronic pain: Secondary | ICD-10-CM | POA: Diagnosis not present

## 2020-08-19 DIAGNOSIS — M1990 Unspecified osteoarthritis, unspecified site: Secondary | ICD-10-CM | POA: Diagnosis not present

## 2020-08-20 DIAGNOSIS — I509 Heart failure, unspecified: Secondary | ICD-10-CM | POA: Diagnosis not present

## 2020-08-20 DIAGNOSIS — Z853 Personal history of malignant neoplasm of breast: Secondary | ICD-10-CM | POA: Diagnosis not present

## 2020-08-20 DIAGNOSIS — I110 Hypertensive heart disease with heart failure: Secondary | ICD-10-CM | POA: Diagnosis not present

## 2020-08-20 DIAGNOSIS — G8929 Other chronic pain: Secondary | ICD-10-CM | POA: Diagnosis not present

## 2020-08-20 DIAGNOSIS — M1990 Unspecified osteoarthritis, unspecified site: Secondary | ICD-10-CM | POA: Diagnosis not present

## 2020-08-20 DIAGNOSIS — R627 Adult failure to thrive: Secondary | ICD-10-CM | POA: Diagnosis not present

## 2020-08-22 DIAGNOSIS — I509 Heart failure, unspecified: Secondary | ICD-10-CM | POA: Diagnosis not present

## 2020-08-22 DIAGNOSIS — M1990 Unspecified osteoarthritis, unspecified site: Secondary | ICD-10-CM | POA: Diagnosis not present

## 2020-08-22 DIAGNOSIS — R627 Adult failure to thrive: Secondary | ICD-10-CM | POA: Diagnosis not present

## 2020-08-22 DIAGNOSIS — Z853 Personal history of malignant neoplasm of breast: Secondary | ICD-10-CM | POA: Diagnosis not present

## 2020-08-22 DIAGNOSIS — I110 Hypertensive heart disease with heart failure: Secondary | ICD-10-CM | POA: Diagnosis not present

## 2020-08-22 DIAGNOSIS — G8929 Other chronic pain: Secondary | ICD-10-CM | POA: Diagnosis not present

## 2020-08-25 DIAGNOSIS — R627 Adult failure to thrive: Secondary | ICD-10-CM | POA: Diagnosis not present

## 2020-08-25 DIAGNOSIS — G8929 Other chronic pain: Secondary | ICD-10-CM | POA: Diagnosis not present

## 2020-08-25 DIAGNOSIS — I509 Heart failure, unspecified: Secondary | ICD-10-CM | POA: Diagnosis not present

## 2020-08-25 DIAGNOSIS — I110 Hypertensive heart disease with heart failure: Secondary | ICD-10-CM | POA: Diagnosis not present

## 2020-08-25 DIAGNOSIS — Z853 Personal history of malignant neoplasm of breast: Secondary | ICD-10-CM | POA: Diagnosis not present

## 2020-08-25 DIAGNOSIS — M1990 Unspecified osteoarthritis, unspecified site: Secondary | ICD-10-CM | POA: Diagnosis not present

## 2020-08-26 DIAGNOSIS — Z853 Personal history of malignant neoplasm of breast: Secondary | ICD-10-CM | POA: Diagnosis not present

## 2020-08-26 DIAGNOSIS — M1990 Unspecified osteoarthritis, unspecified site: Secondary | ICD-10-CM | POA: Diagnosis not present

## 2020-08-26 DIAGNOSIS — I110 Hypertensive heart disease with heart failure: Secondary | ICD-10-CM | POA: Diagnosis not present

## 2020-08-26 DIAGNOSIS — I509 Heart failure, unspecified: Secondary | ICD-10-CM | POA: Diagnosis not present

## 2020-08-26 DIAGNOSIS — R627 Adult failure to thrive: Secondary | ICD-10-CM | POA: Diagnosis not present

## 2020-08-26 DIAGNOSIS — G8929 Other chronic pain: Secondary | ICD-10-CM | POA: Diagnosis not present

## 2020-08-27 DIAGNOSIS — G8929 Other chronic pain: Secondary | ICD-10-CM | POA: Diagnosis not present

## 2020-08-27 DIAGNOSIS — I509 Heart failure, unspecified: Secondary | ICD-10-CM | POA: Diagnosis not present

## 2020-08-27 DIAGNOSIS — I110 Hypertensive heart disease with heart failure: Secondary | ICD-10-CM | POA: Diagnosis not present

## 2020-08-27 DIAGNOSIS — Z853 Personal history of malignant neoplasm of breast: Secondary | ICD-10-CM | POA: Diagnosis not present

## 2020-08-27 DIAGNOSIS — R627 Adult failure to thrive: Secondary | ICD-10-CM | POA: Diagnosis not present

## 2020-08-27 DIAGNOSIS — M1990 Unspecified osteoarthritis, unspecified site: Secondary | ICD-10-CM | POA: Diagnosis not present

## 2020-08-28 DIAGNOSIS — I509 Heart failure, unspecified: Secondary | ICD-10-CM | POA: Diagnosis not present

## 2020-08-28 DIAGNOSIS — R627 Adult failure to thrive: Secondary | ICD-10-CM | POA: Diagnosis not present

## 2020-08-28 DIAGNOSIS — Z853 Personal history of malignant neoplasm of breast: Secondary | ICD-10-CM | POA: Diagnosis not present

## 2020-08-28 DIAGNOSIS — G8929 Other chronic pain: Secondary | ICD-10-CM | POA: Diagnosis not present

## 2020-08-28 DIAGNOSIS — M1990 Unspecified osteoarthritis, unspecified site: Secondary | ICD-10-CM | POA: Diagnosis not present

## 2020-08-28 DIAGNOSIS — I110 Hypertensive heart disease with heart failure: Secondary | ICD-10-CM | POA: Diagnosis not present

## 2020-08-29 DIAGNOSIS — Z853 Personal history of malignant neoplasm of breast: Secondary | ICD-10-CM | POA: Diagnosis not present

## 2020-08-29 DIAGNOSIS — M1990 Unspecified osteoarthritis, unspecified site: Secondary | ICD-10-CM | POA: Diagnosis not present

## 2020-08-29 DIAGNOSIS — G8929 Other chronic pain: Secondary | ICD-10-CM | POA: Diagnosis not present

## 2020-08-29 DIAGNOSIS — K5900 Constipation, unspecified: Secondary | ICD-10-CM | POA: Diagnosis not present

## 2020-08-29 DIAGNOSIS — M549 Dorsalgia, unspecified: Secondary | ICD-10-CM | POA: Diagnosis not present

## 2020-08-29 DIAGNOSIS — E039 Hypothyroidism, unspecified: Secondary | ICD-10-CM | POA: Diagnosis not present

## 2020-08-29 DIAGNOSIS — Z87440 Personal history of urinary (tract) infections: Secondary | ICD-10-CM | POA: Diagnosis not present

## 2020-08-29 DIAGNOSIS — I110 Hypertensive heart disease with heart failure: Secondary | ICD-10-CM | POA: Diagnosis not present

## 2020-08-29 DIAGNOSIS — I509 Heart failure, unspecified: Secondary | ICD-10-CM | POA: Diagnosis not present

## 2020-08-29 DIAGNOSIS — R627 Adult failure to thrive: Secondary | ICD-10-CM | POA: Diagnosis not present

## 2020-09-01 DIAGNOSIS — I110 Hypertensive heart disease with heart failure: Secondary | ICD-10-CM | POA: Diagnosis not present

## 2020-09-01 DIAGNOSIS — M1990 Unspecified osteoarthritis, unspecified site: Secondary | ICD-10-CM | POA: Diagnosis not present

## 2020-09-01 DIAGNOSIS — G8929 Other chronic pain: Secondary | ICD-10-CM | POA: Diagnosis not present

## 2020-09-01 DIAGNOSIS — I509 Heart failure, unspecified: Secondary | ICD-10-CM | POA: Diagnosis not present

## 2020-09-01 DIAGNOSIS — R627 Adult failure to thrive: Secondary | ICD-10-CM | POA: Diagnosis not present

## 2020-09-01 DIAGNOSIS — Z853 Personal history of malignant neoplasm of breast: Secondary | ICD-10-CM | POA: Diagnosis not present

## 2020-09-02 DIAGNOSIS — I110 Hypertensive heart disease with heart failure: Secondary | ICD-10-CM | POA: Diagnosis not present

## 2020-09-02 DIAGNOSIS — Z853 Personal history of malignant neoplasm of breast: Secondary | ICD-10-CM | POA: Diagnosis not present

## 2020-09-02 DIAGNOSIS — G8929 Other chronic pain: Secondary | ICD-10-CM | POA: Diagnosis not present

## 2020-09-02 DIAGNOSIS — R627 Adult failure to thrive: Secondary | ICD-10-CM | POA: Diagnosis not present

## 2020-09-02 DIAGNOSIS — I509 Heart failure, unspecified: Secondary | ICD-10-CM | POA: Diagnosis not present

## 2020-09-02 DIAGNOSIS — M1990 Unspecified osteoarthritis, unspecified site: Secondary | ICD-10-CM | POA: Diagnosis not present

## 2020-09-03 DIAGNOSIS — I509 Heart failure, unspecified: Secondary | ICD-10-CM | POA: Diagnosis not present

## 2020-09-03 DIAGNOSIS — G8929 Other chronic pain: Secondary | ICD-10-CM | POA: Diagnosis not present

## 2020-09-03 DIAGNOSIS — Z853 Personal history of malignant neoplasm of breast: Secondary | ICD-10-CM | POA: Diagnosis not present

## 2020-09-03 DIAGNOSIS — R627 Adult failure to thrive: Secondary | ICD-10-CM | POA: Diagnosis not present

## 2020-09-03 DIAGNOSIS — M1990 Unspecified osteoarthritis, unspecified site: Secondary | ICD-10-CM | POA: Diagnosis not present

## 2020-09-03 DIAGNOSIS — I110 Hypertensive heart disease with heart failure: Secondary | ICD-10-CM | POA: Diagnosis not present

## 2020-09-05 DIAGNOSIS — I110 Hypertensive heart disease with heart failure: Secondary | ICD-10-CM | POA: Diagnosis not present

## 2020-09-05 DIAGNOSIS — R627 Adult failure to thrive: Secondary | ICD-10-CM | POA: Diagnosis not present

## 2020-09-05 DIAGNOSIS — I509 Heart failure, unspecified: Secondary | ICD-10-CM | POA: Diagnosis not present

## 2020-09-05 DIAGNOSIS — M1990 Unspecified osteoarthritis, unspecified site: Secondary | ICD-10-CM | POA: Diagnosis not present

## 2020-09-05 DIAGNOSIS — Z853 Personal history of malignant neoplasm of breast: Secondary | ICD-10-CM | POA: Diagnosis not present

## 2020-09-05 DIAGNOSIS — G8929 Other chronic pain: Secondary | ICD-10-CM | POA: Diagnosis not present

## 2020-09-08 DIAGNOSIS — Z853 Personal history of malignant neoplasm of breast: Secondary | ICD-10-CM | POA: Diagnosis not present

## 2020-09-08 DIAGNOSIS — G8929 Other chronic pain: Secondary | ICD-10-CM | POA: Diagnosis not present

## 2020-09-08 DIAGNOSIS — I509 Heart failure, unspecified: Secondary | ICD-10-CM | POA: Diagnosis not present

## 2020-09-08 DIAGNOSIS — I110 Hypertensive heart disease with heart failure: Secondary | ICD-10-CM | POA: Diagnosis not present

## 2020-09-08 DIAGNOSIS — M1990 Unspecified osteoarthritis, unspecified site: Secondary | ICD-10-CM | POA: Diagnosis not present

## 2020-09-08 DIAGNOSIS — R627 Adult failure to thrive: Secondary | ICD-10-CM | POA: Diagnosis not present

## 2020-09-09 DIAGNOSIS — R627 Adult failure to thrive: Secondary | ICD-10-CM | POA: Diagnosis not present

## 2020-09-09 DIAGNOSIS — I509 Heart failure, unspecified: Secondary | ICD-10-CM | POA: Diagnosis not present

## 2020-09-09 DIAGNOSIS — M1990 Unspecified osteoarthritis, unspecified site: Secondary | ICD-10-CM | POA: Diagnosis not present

## 2020-09-09 DIAGNOSIS — G8929 Other chronic pain: Secondary | ICD-10-CM | POA: Diagnosis not present

## 2020-09-09 DIAGNOSIS — I110 Hypertensive heart disease with heart failure: Secondary | ICD-10-CM | POA: Diagnosis not present

## 2020-09-09 DIAGNOSIS — Z853 Personal history of malignant neoplasm of breast: Secondary | ICD-10-CM | POA: Diagnosis not present

## 2020-09-10 DIAGNOSIS — Z853 Personal history of malignant neoplasm of breast: Secondary | ICD-10-CM | POA: Diagnosis not present

## 2020-09-10 DIAGNOSIS — G8929 Other chronic pain: Secondary | ICD-10-CM | POA: Diagnosis not present

## 2020-09-10 DIAGNOSIS — M1990 Unspecified osteoarthritis, unspecified site: Secondary | ICD-10-CM | POA: Diagnosis not present

## 2020-09-10 DIAGNOSIS — R627 Adult failure to thrive: Secondary | ICD-10-CM | POA: Diagnosis not present

## 2020-09-10 DIAGNOSIS — I509 Heart failure, unspecified: Secondary | ICD-10-CM | POA: Diagnosis not present

## 2020-09-10 DIAGNOSIS — I110 Hypertensive heart disease with heart failure: Secondary | ICD-10-CM | POA: Diagnosis not present

## 2020-09-12 DIAGNOSIS — Z853 Personal history of malignant neoplasm of breast: Secondary | ICD-10-CM | POA: Diagnosis not present

## 2020-09-12 DIAGNOSIS — R627 Adult failure to thrive: Secondary | ICD-10-CM | POA: Diagnosis not present

## 2020-09-12 DIAGNOSIS — M1990 Unspecified osteoarthritis, unspecified site: Secondary | ICD-10-CM | POA: Diagnosis not present

## 2020-09-12 DIAGNOSIS — I509 Heart failure, unspecified: Secondary | ICD-10-CM | POA: Diagnosis not present

## 2020-09-12 DIAGNOSIS — G8929 Other chronic pain: Secondary | ICD-10-CM | POA: Diagnosis not present

## 2020-09-12 DIAGNOSIS — I110 Hypertensive heart disease with heart failure: Secondary | ICD-10-CM | POA: Diagnosis not present

## 2020-09-15 DIAGNOSIS — R627 Adult failure to thrive: Secondary | ICD-10-CM | POA: Diagnosis not present

## 2020-09-15 DIAGNOSIS — G8929 Other chronic pain: Secondary | ICD-10-CM | POA: Diagnosis not present

## 2020-09-15 DIAGNOSIS — I110 Hypertensive heart disease with heart failure: Secondary | ICD-10-CM | POA: Diagnosis not present

## 2020-09-15 DIAGNOSIS — M1990 Unspecified osteoarthritis, unspecified site: Secondary | ICD-10-CM | POA: Diagnosis not present

## 2020-09-15 DIAGNOSIS — I509 Heart failure, unspecified: Secondary | ICD-10-CM | POA: Diagnosis not present

## 2020-09-15 DIAGNOSIS — Z853 Personal history of malignant neoplasm of breast: Secondary | ICD-10-CM | POA: Diagnosis not present

## 2020-09-16 DIAGNOSIS — Z853 Personal history of malignant neoplasm of breast: Secondary | ICD-10-CM | POA: Diagnosis not present

## 2020-09-16 DIAGNOSIS — R627 Adult failure to thrive: Secondary | ICD-10-CM | POA: Diagnosis not present

## 2020-09-16 DIAGNOSIS — I509 Heart failure, unspecified: Secondary | ICD-10-CM | POA: Diagnosis not present

## 2020-09-16 DIAGNOSIS — I110 Hypertensive heart disease with heart failure: Secondary | ICD-10-CM | POA: Diagnosis not present

## 2020-09-16 DIAGNOSIS — M1990 Unspecified osteoarthritis, unspecified site: Secondary | ICD-10-CM | POA: Diagnosis not present

## 2020-09-16 DIAGNOSIS — G8929 Other chronic pain: Secondary | ICD-10-CM | POA: Diagnosis not present

## 2020-09-17 DIAGNOSIS — I509 Heart failure, unspecified: Secondary | ICD-10-CM | POA: Diagnosis not present

## 2020-09-17 DIAGNOSIS — G8929 Other chronic pain: Secondary | ICD-10-CM | POA: Diagnosis not present

## 2020-09-17 DIAGNOSIS — I110 Hypertensive heart disease with heart failure: Secondary | ICD-10-CM | POA: Diagnosis not present

## 2020-09-17 DIAGNOSIS — Z853 Personal history of malignant neoplasm of breast: Secondary | ICD-10-CM | POA: Diagnosis not present

## 2020-09-17 DIAGNOSIS — R627 Adult failure to thrive: Secondary | ICD-10-CM | POA: Diagnosis not present

## 2020-09-17 DIAGNOSIS — M1990 Unspecified osteoarthritis, unspecified site: Secondary | ICD-10-CM | POA: Diagnosis not present

## 2020-09-19 DIAGNOSIS — R627 Adult failure to thrive: Secondary | ICD-10-CM | POA: Diagnosis not present

## 2020-09-19 DIAGNOSIS — M1990 Unspecified osteoarthritis, unspecified site: Secondary | ICD-10-CM | POA: Diagnosis not present

## 2020-09-19 DIAGNOSIS — G8929 Other chronic pain: Secondary | ICD-10-CM | POA: Diagnosis not present

## 2020-09-19 DIAGNOSIS — Z853 Personal history of malignant neoplasm of breast: Secondary | ICD-10-CM | POA: Diagnosis not present

## 2020-09-19 DIAGNOSIS — I110 Hypertensive heart disease with heart failure: Secondary | ICD-10-CM | POA: Diagnosis not present

## 2020-09-19 DIAGNOSIS — I509 Heart failure, unspecified: Secondary | ICD-10-CM | POA: Diagnosis not present

## 2020-09-22 DIAGNOSIS — G8929 Other chronic pain: Secondary | ICD-10-CM | POA: Diagnosis not present

## 2020-09-22 DIAGNOSIS — M1990 Unspecified osteoarthritis, unspecified site: Secondary | ICD-10-CM | POA: Diagnosis not present

## 2020-09-22 DIAGNOSIS — I110 Hypertensive heart disease with heart failure: Secondary | ICD-10-CM | POA: Diagnosis not present

## 2020-09-22 DIAGNOSIS — I509 Heart failure, unspecified: Secondary | ICD-10-CM | POA: Diagnosis not present

## 2020-09-22 DIAGNOSIS — R627 Adult failure to thrive: Secondary | ICD-10-CM | POA: Diagnosis not present

## 2020-09-22 DIAGNOSIS — Z853 Personal history of malignant neoplasm of breast: Secondary | ICD-10-CM | POA: Diagnosis not present

## 2020-09-23 DIAGNOSIS — I110 Hypertensive heart disease with heart failure: Secondary | ICD-10-CM | POA: Diagnosis not present

## 2020-09-23 DIAGNOSIS — I509 Heart failure, unspecified: Secondary | ICD-10-CM | POA: Diagnosis not present

## 2020-09-23 DIAGNOSIS — G8929 Other chronic pain: Secondary | ICD-10-CM | POA: Diagnosis not present

## 2020-09-23 DIAGNOSIS — R627 Adult failure to thrive: Secondary | ICD-10-CM | POA: Diagnosis not present

## 2020-09-23 DIAGNOSIS — M1990 Unspecified osteoarthritis, unspecified site: Secondary | ICD-10-CM | POA: Diagnosis not present

## 2020-09-23 DIAGNOSIS — Z853 Personal history of malignant neoplasm of breast: Secondary | ICD-10-CM | POA: Diagnosis not present

## 2020-09-24 DIAGNOSIS — G8929 Other chronic pain: Secondary | ICD-10-CM | POA: Diagnosis not present

## 2020-09-24 DIAGNOSIS — R627 Adult failure to thrive: Secondary | ICD-10-CM | POA: Diagnosis not present

## 2020-09-24 DIAGNOSIS — Z853 Personal history of malignant neoplasm of breast: Secondary | ICD-10-CM | POA: Diagnosis not present

## 2020-09-24 DIAGNOSIS — M1990 Unspecified osteoarthritis, unspecified site: Secondary | ICD-10-CM | POA: Diagnosis not present

## 2020-09-24 DIAGNOSIS — I509 Heart failure, unspecified: Secondary | ICD-10-CM | POA: Diagnosis not present

## 2020-09-24 DIAGNOSIS — I110 Hypertensive heart disease with heart failure: Secondary | ICD-10-CM | POA: Diagnosis not present

## 2020-09-25 DIAGNOSIS — M1990 Unspecified osteoarthritis, unspecified site: Secondary | ICD-10-CM | POA: Diagnosis not present

## 2020-09-25 DIAGNOSIS — R627 Adult failure to thrive: Secondary | ICD-10-CM | POA: Diagnosis not present

## 2020-09-25 DIAGNOSIS — Z853 Personal history of malignant neoplasm of breast: Secondary | ICD-10-CM | POA: Diagnosis not present

## 2020-09-25 DIAGNOSIS — I110 Hypertensive heart disease with heart failure: Secondary | ICD-10-CM | POA: Diagnosis not present

## 2020-09-25 DIAGNOSIS — I509 Heart failure, unspecified: Secondary | ICD-10-CM | POA: Diagnosis not present

## 2020-09-25 DIAGNOSIS — G8929 Other chronic pain: Secondary | ICD-10-CM | POA: Diagnosis not present

## 2020-09-26 DIAGNOSIS — Z853 Personal history of malignant neoplasm of breast: Secondary | ICD-10-CM | POA: Diagnosis not present

## 2020-09-26 DIAGNOSIS — I509 Heart failure, unspecified: Secondary | ICD-10-CM | POA: Diagnosis not present

## 2020-09-26 DIAGNOSIS — I110 Hypertensive heart disease with heart failure: Secondary | ICD-10-CM | POA: Diagnosis not present

## 2020-09-26 DIAGNOSIS — M1990 Unspecified osteoarthritis, unspecified site: Secondary | ICD-10-CM | POA: Diagnosis not present

## 2020-09-26 DIAGNOSIS — G8929 Other chronic pain: Secondary | ICD-10-CM | POA: Diagnosis not present

## 2020-09-26 DIAGNOSIS — R627 Adult failure to thrive: Secondary | ICD-10-CM | POA: Diagnosis not present

## 2020-09-28 DIAGNOSIS — G8929 Other chronic pain: Secondary | ICD-10-CM | POA: Diagnosis not present

## 2020-09-28 DIAGNOSIS — Z87440 Personal history of urinary (tract) infections: Secondary | ICD-10-CM | POA: Diagnosis not present

## 2020-09-28 DIAGNOSIS — K5900 Constipation, unspecified: Secondary | ICD-10-CM | POA: Diagnosis not present

## 2020-09-28 DIAGNOSIS — R627 Adult failure to thrive: Secondary | ICD-10-CM | POA: Diagnosis not present

## 2020-09-28 DIAGNOSIS — E039 Hypothyroidism, unspecified: Secondary | ICD-10-CM | POA: Diagnosis not present

## 2020-09-28 DIAGNOSIS — I509 Heart failure, unspecified: Secondary | ICD-10-CM | POA: Diagnosis not present

## 2020-09-28 DIAGNOSIS — M549 Dorsalgia, unspecified: Secondary | ICD-10-CM | POA: Diagnosis not present

## 2020-09-28 DIAGNOSIS — Z853 Personal history of malignant neoplasm of breast: Secondary | ICD-10-CM | POA: Diagnosis not present

## 2020-09-28 DIAGNOSIS — I110 Hypertensive heart disease with heart failure: Secondary | ICD-10-CM | POA: Diagnosis not present

## 2020-09-28 DIAGNOSIS — M1990 Unspecified osteoarthritis, unspecified site: Secondary | ICD-10-CM | POA: Diagnosis not present

## 2020-09-29 DIAGNOSIS — Z853 Personal history of malignant neoplasm of breast: Secondary | ICD-10-CM | POA: Diagnosis not present

## 2020-09-29 DIAGNOSIS — G8929 Other chronic pain: Secondary | ICD-10-CM | POA: Diagnosis not present

## 2020-09-29 DIAGNOSIS — R627 Adult failure to thrive: Secondary | ICD-10-CM | POA: Diagnosis not present

## 2020-09-29 DIAGNOSIS — M1990 Unspecified osteoarthritis, unspecified site: Secondary | ICD-10-CM | POA: Diagnosis not present

## 2020-09-29 DIAGNOSIS — I509 Heart failure, unspecified: Secondary | ICD-10-CM | POA: Diagnosis not present

## 2020-09-29 DIAGNOSIS — I110 Hypertensive heart disease with heart failure: Secondary | ICD-10-CM | POA: Diagnosis not present

## 2020-09-30 DIAGNOSIS — I509 Heart failure, unspecified: Secondary | ICD-10-CM | POA: Diagnosis not present

## 2020-09-30 DIAGNOSIS — G8929 Other chronic pain: Secondary | ICD-10-CM | POA: Diagnosis not present

## 2020-09-30 DIAGNOSIS — I110 Hypertensive heart disease with heart failure: Secondary | ICD-10-CM | POA: Diagnosis not present

## 2020-09-30 DIAGNOSIS — Z853 Personal history of malignant neoplasm of breast: Secondary | ICD-10-CM | POA: Diagnosis not present

## 2020-09-30 DIAGNOSIS — R627 Adult failure to thrive: Secondary | ICD-10-CM | POA: Diagnosis not present

## 2020-09-30 DIAGNOSIS — M1990 Unspecified osteoarthritis, unspecified site: Secondary | ICD-10-CM | POA: Diagnosis not present

## 2020-10-01 DIAGNOSIS — R627 Adult failure to thrive: Secondary | ICD-10-CM | POA: Diagnosis not present

## 2020-10-01 DIAGNOSIS — Z853 Personal history of malignant neoplasm of breast: Secondary | ICD-10-CM | POA: Diagnosis not present

## 2020-10-01 DIAGNOSIS — M1990 Unspecified osteoarthritis, unspecified site: Secondary | ICD-10-CM | POA: Diagnosis not present

## 2020-10-01 DIAGNOSIS — I110 Hypertensive heart disease with heart failure: Secondary | ICD-10-CM | POA: Diagnosis not present

## 2020-10-01 DIAGNOSIS — G8929 Other chronic pain: Secondary | ICD-10-CM | POA: Diagnosis not present

## 2020-10-01 DIAGNOSIS — I509 Heart failure, unspecified: Secondary | ICD-10-CM | POA: Diagnosis not present

## 2020-10-03 DIAGNOSIS — I509 Heart failure, unspecified: Secondary | ICD-10-CM | POA: Diagnosis not present

## 2020-10-03 DIAGNOSIS — M1990 Unspecified osteoarthritis, unspecified site: Secondary | ICD-10-CM | POA: Diagnosis not present

## 2020-10-03 DIAGNOSIS — Z853 Personal history of malignant neoplasm of breast: Secondary | ICD-10-CM | POA: Diagnosis not present

## 2020-10-03 DIAGNOSIS — G8929 Other chronic pain: Secondary | ICD-10-CM | POA: Diagnosis not present

## 2020-10-03 DIAGNOSIS — R627 Adult failure to thrive: Secondary | ICD-10-CM | POA: Diagnosis not present

## 2020-10-03 DIAGNOSIS — I110 Hypertensive heart disease with heart failure: Secondary | ICD-10-CM | POA: Diagnosis not present

## 2020-10-06 DIAGNOSIS — R627 Adult failure to thrive: Secondary | ICD-10-CM | POA: Diagnosis not present

## 2020-10-06 DIAGNOSIS — I110 Hypertensive heart disease with heart failure: Secondary | ICD-10-CM | POA: Diagnosis not present

## 2020-10-06 DIAGNOSIS — M1990 Unspecified osteoarthritis, unspecified site: Secondary | ICD-10-CM | POA: Diagnosis not present

## 2020-10-06 DIAGNOSIS — Z853 Personal history of malignant neoplasm of breast: Secondary | ICD-10-CM | POA: Diagnosis not present

## 2020-10-06 DIAGNOSIS — G8929 Other chronic pain: Secondary | ICD-10-CM | POA: Diagnosis not present

## 2020-10-06 DIAGNOSIS — I509 Heart failure, unspecified: Secondary | ICD-10-CM | POA: Diagnosis not present

## 2020-10-07 DIAGNOSIS — Z853 Personal history of malignant neoplasm of breast: Secondary | ICD-10-CM | POA: Diagnosis not present

## 2020-10-07 DIAGNOSIS — G8929 Other chronic pain: Secondary | ICD-10-CM | POA: Diagnosis not present

## 2020-10-07 DIAGNOSIS — R627 Adult failure to thrive: Secondary | ICD-10-CM | POA: Diagnosis not present

## 2020-10-07 DIAGNOSIS — I110 Hypertensive heart disease with heart failure: Secondary | ICD-10-CM | POA: Diagnosis not present

## 2020-10-07 DIAGNOSIS — M1990 Unspecified osteoarthritis, unspecified site: Secondary | ICD-10-CM | POA: Diagnosis not present

## 2020-10-07 DIAGNOSIS — I509 Heart failure, unspecified: Secondary | ICD-10-CM | POA: Diagnosis not present

## 2020-10-08 DIAGNOSIS — M1990 Unspecified osteoarthritis, unspecified site: Secondary | ICD-10-CM | POA: Diagnosis not present

## 2020-10-08 DIAGNOSIS — I509 Heart failure, unspecified: Secondary | ICD-10-CM | POA: Diagnosis not present

## 2020-10-08 DIAGNOSIS — R627 Adult failure to thrive: Secondary | ICD-10-CM | POA: Diagnosis not present

## 2020-10-08 DIAGNOSIS — I110 Hypertensive heart disease with heart failure: Secondary | ICD-10-CM | POA: Diagnosis not present

## 2020-10-08 DIAGNOSIS — G8929 Other chronic pain: Secondary | ICD-10-CM | POA: Diagnosis not present

## 2020-10-08 DIAGNOSIS — Z853 Personal history of malignant neoplasm of breast: Secondary | ICD-10-CM | POA: Diagnosis not present

## 2020-10-10 DIAGNOSIS — G8929 Other chronic pain: Secondary | ICD-10-CM | POA: Diagnosis not present

## 2020-10-10 DIAGNOSIS — R627 Adult failure to thrive: Secondary | ICD-10-CM | POA: Diagnosis not present

## 2020-10-10 DIAGNOSIS — I509 Heart failure, unspecified: Secondary | ICD-10-CM | POA: Diagnosis not present

## 2020-10-10 DIAGNOSIS — I110 Hypertensive heart disease with heart failure: Secondary | ICD-10-CM | POA: Diagnosis not present

## 2020-10-10 DIAGNOSIS — M1990 Unspecified osteoarthritis, unspecified site: Secondary | ICD-10-CM | POA: Diagnosis not present

## 2020-10-10 DIAGNOSIS — Z853 Personal history of malignant neoplasm of breast: Secondary | ICD-10-CM | POA: Diagnosis not present

## 2020-10-13 DIAGNOSIS — R627 Adult failure to thrive: Secondary | ICD-10-CM | POA: Diagnosis not present

## 2020-10-13 DIAGNOSIS — M1990 Unspecified osteoarthritis, unspecified site: Secondary | ICD-10-CM | POA: Diagnosis not present

## 2020-10-13 DIAGNOSIS — I509 Heart failure, unspecified: Secondary | ICD-10-CM | POA: Diagnosis not present

## 2020-10-13 DIAGNOSIS — Z853 Personal history of malignant neoplasm of breast: Secondary | ICD-10-CM | POA: Diagnosis not present

## 2020-10-13 DIAGNOSIS — I110 Hypertensive heart disease with heart failure: Secondary | ICD-10-CM | POA: Diagnosis not present

## 2020-10-13 DIAGNOSIS — G8929 Other chronic pain: Secondary | ICD-10-CM | POA: Diagnosis not present

## 2020-10-14 DIAGNOSIS — I110 Hypertensive heart disease with heart failure: Secondary | ICD-10-CM | POA: Diagnosis not present

## 2020-10-14 DIAGNOSIS — Z853 Personal history of malignant neoplasm of breast: Secondary | ICD-10-CM | POA: Diagnosis not present

## 2020-10-14 DIAGNOSIS — I509 Heart failure, unspecified: Secondary | ICD-10-CM | POA: Diagnosis not present

## 2020-10-14 DIAGNOSIS — R627 Adult failure to thrive: Secondary | ICD-10-CM | POA: Diagnosis not present

## 2020-10-14 DIAGNOSIS — G8929 Other chronic pain: Secondary | ICD-10-CM | POA: Diagnosis not present

## 2020-10-14 DIAGNOSIS — M1990 Unspecified osteoarthritis, unspecified site: Secondary | ICD-10-CM | POA: Diagnosis not present

## 2020-10-15 DIAGNOSIS — R627 Adult failure to thrive: Secondary | ICD-10-CM | POA: Diagnosis not present

## 2020-10-15 DIAGNOSIS — Z853 Personal history of malignant neoplasm of breast: Secondary | ICD-10-CM | POA: Diagnosis not present

## 2020-10-15 DIAGNOSIS — G8929 Other chronic pain: Secondary | ICD-10-CM | POA: Diagnosis not present

## 2020-10-15 DIAGNOSIS — M1990 Unspecified osteoarthritis, unspecified site: Secondary | ICD-10-CM | POA: Diagnosis not present

## 2020-10-15 DIAGNOSIS — I509 Heart failure, unspecified: Secondary | ICD-10-CM | POA: Diagnosis not present

## 2020-10-15 DIAGNOSIS — I110 Hypertensive heart disease with heart failure: Secondary | ICD-10-CM | POA: Diagnosis not present

## 2020-10-16 DIAGNOSIS — M1990 Unspecified osteoarthritis, unspecified site: Secondary | ICD-10-CM | POA: Diagnosis not present

## 2020-10-16 DIAGNOSIS — G8929 Other chronic pain: Secondary | ICD-10-CM | POA: Diagnosis not present

## 2020-10-16 DIAGNOSIS — I110 Hypertensive heart disease with heart failure: Secondary | ICD-10-CM | POA: Diagnosis not present

## 2020-10-16 DIAGNOSIS — R627 Adult failure to thrive: Secondary | ICD-10-CM | POA: Diagnosis not present

## 2020-10-16 DIAGNOSIS — Z853 Personal history of malignant neoplasm of breast: Secondary | ICD-10-CM | POA: Diagnosis not present

## 2020-10-16 DIAGNOSIS — I509 Heart failure, unspecified: Secondary | ICD-10-CM | POA: Diagnosis not present

## 2020-10-17 DIAGNOSIS — G8929 Other chronic pain: Secondary | ICD-10-CM | POA: Diagnosis not present

## 2020-10-17 DIAGNOSIS — Z853 Personal history of malignant neoplasm of breast: Secondary | ICD-10-CM | POA: Diagnosis not present

## 2020-10-17 DIAGNOSIS — R627 Adult failure to thrive: Secondary | ICD-10-CM | POA: Diagnosis not present

## 2020-10-17 DIAGNOSIS — I509 Heart failure, unspecified: Secondary | ICD-10-CM | POA: Diagnosis not present

## 2020-10-17 DIAGNOSIS — M1990 Unspecified osteoarthritis, unspecified site: Secondary | ICD-10-CM | POA: Diagnosis not present

## 2020-10-17 DIAGNOSIS — I110 Hypertensive heart disease with heart failure: Secondary | ICD-10-CM | POA: Diagnosis not present

## 2020-10-20 DIAGNOSIS — M1990 Unspecified osteoarthritis, unspecified site: Secondary | ICD-10-CM | POA: Diagnosis not present

## 2020-10-20 DIAGNOSIS — I110 Hypertensive heart disease with heart failure: Secondary | ICD-10-CM | POA: Diagnosis not present

## 2020-10-20 DIAGNOSIS — R627 Adult failure to thrive: Secondary | ICD-10-CM | POA: Diagnosis not present

## 2020-10-20 DIAGNOSIS — Z853 Personal history of malignant neoplasm of breast: Secondary | ICD-10-CM | POA: Diagnosis not present

## 2020-10-20 DIAGNOSIS — G8929 Other chronic pain: Secondary | ICD-10-CM | POA: Diagnosis not present

## 2020-10-20 DIAGNOSIS — I509 Heart failure, unspecified: Secondary | ICD-10-CM | POA: Diagnosis not present

## 2020-10-21 DIAGNOSIS — G8929 Other chronic pain: Secondary | ICD-10-CM | POA: Diagnosis not present

## 2020-10-21 DIAGNOSIS — I110 Hypertensive heart disease with heart failure: Secondary | ICD-10-CM | POA: Diagnosis not present

## 2020-10-21 DIAGNOSIS — Z853 Personal history of malignant neoplasm of breast: Secondary | ICD-10-CM | POA: Diagnosis not present

## 2020-10-21 DIAGNOSIS — R627 Adult failure to thrive: Secondary | ICD-10-CM | POA: Diagnosis not present

## 2020-10-21 DIAGNOSIS — M1990 Unspecified osteoarthritis, unspecified site: Secondary | ICD-10-CM | POA: Diagnosis not present

## 2020-10-21 DIAGNOSIS — I509 Heart failure, unspecified: Secondary | ICD-10-CM | POA: Diagnosis not present

## 2020-10-22 DIAGNOSIS — G8929 Other chronic pain: Secondary | ICD-10-CM | POA: Diagnosis not present

## 2020-10-22 DIAGNOSIS — Z853 Personal history of malignant neoplasm of breast: Secondary | ICD-10-CM | POA: Diagnosis not present

## 2020-10-22 DIAGNOSIS — M1990 Unspecified osteoarthritis, unspecified site: Secondary | ICD-10-CM | POA: Diagnosis not present

## 2020-10-22 DIAGNOSIS — I509 Heart failure, unspecified: Secondary | ICD-10-CM | POA: Diagnosis not present

## 2020-10-22 DIAGNOSIS — I110 Hypertensive heart disease with heart failure: Secondary | ICD-10-CM | POA: Diagnosis not present

## 2020-10-22 DIAGNOSIS — R627 Adult failure to thrive: Secondary | ICD-10-CM | POA: Diagnosis not present

## 2020-10-23 DIAGNOSIS — I110 Hypertensive heart disease with heart failure: Secondary | ICD-10-CM | POA: Diagnosis not present

## 2020-10-23 DIAGNOSIS — M1990 Unspecified osteoarthritis, unspecified site: Secondary | ICD-10-CM | POA: Diagnosis not present

## 2020-10-23 DIAGNOSIS — I509 Heart failure, unspecified: Secondary | ICD-10-CM | POA: Diagnosis not present

## 2020-10-23 DIAGNOSIS — R627 Adult failure to thrive: Secondary | ICD-10-CM | POA: Diagnosis not present

## 2020-10-23 DIAGNOSIS — Z853 Personal history of malignant neoplasm of breast: Secondary | ICD-10-CM | POA: Diagnosis not present

## 2020-10-23 DIAGNOSIS — G8929 Other chronic pain: Secondary | ICD-10-CM | POA: Diagnosis not present

## 2020-10-24 DIAGNOSIS — I509 Heart failure, unspecified: Secondary | ICD-10-CM | POA: Diagnosis not present

## 2020-10-24 DIAGNOSIS — G8929 Other chronic pain: Secondary | ICD-10-CM | POA: Diagnosis not present

## 2020-10-24 DIAGNOSIS — Z853 Personal history of malignant neoplasm of breast: Secondary | ICD-10-CM | POA: Diagnosis not present

## 2020-10-24 DIAGNOSIS — R627 Adult failure to thrive: Secondary | ICD-10-CM | POA: Diagnosis not present

## 2020-10-24 DIAGNOSIS — M1990 Unspecified osteoarthritis, unspecified site: Secondary | ICD-10-CM | POA: Diagnosis not present

## 2020-10-24 DIAGNOSIS — I110 Hypertensive heart disease with heart failure: Secondary | ICD-10-CM | POA: Diagnosis not present

## 2020-10-27 DIAGNOSIS — I110 Hypertensive heart disease with heart failure: Secondary | ICD-10-CM | POA: Diagnosis not present

## 2020-10-27 DIAGNOSIS — G8929 Other chronic pain: Secondary | ICD-10-CM | POA: Diagnosis not present

## 2020-10-27 DIAGNOSIS — I509 Heart failure, unspecified: Secondary | ICD-10-CM | POA: Diagnosis not present

## 2020-10-27 DIAGNOSIS — Z853 Personal history of malignant neoplasm of breast: Secondary | ICD-10-CM | POA: Diagnosis not present

## 2020-10-27 DIAGNOSIS — M1990 Unspecified osteoarthritis, unspecified site: Secondary | ICD-10-CM | POA: Diagnosis not present

## 2020-10-27 DIAGNOSIS — R627 Adult failure to thrive: Secondary | ICD-10-CM | POA: Diagnosis not present

## 2020-10-28 DIAGNOSIS — I110 Hypertensive heart disease with heart failure: Secondary | ICD-10-CM | POA: Diagnosis not present

## 2020-10-28 DIAGNOSIS — H5203 Hypermetropia, bilateral: Secondary | ICD-10-CM | POA: Diagnosis not present

## 2020-10-28 DIAGNOSIS — H524 Presbyopia: Secondary | ICD-10-CM | POA: Diagnosis not present

## 2020-10-28 DIAGNOSIS — R627 Adult failure to thrive: Secondary | ICD-10-CM | POA: Diagnosis not present

## 2020-10-28 DIAGNOSIS — I509 Heart failure, unspecified: Secondary | ICD-10-CM | POA: Diagnosis not present

## 2020-10-28 DIAGNOSIS — M1990 Unspecified osteoarthritis, unspecified site: Secondary | ICD-10-CM | POA: Diagnosis not present

## 2020-10-28 DIAGNOSIS — H52221 Regular astigmatism, right eye: Secondary | ICD-10-CM | POA: Diagnosis not present

## 2020-10-28 DIAGNOSIS — G8929 Other chronic pain: Secondary | ICD-10-CM | POA: Diagnosis not present

## 2020-10-28 DIAGNOSIS — Z853 Personal history of malignant neoplasm of breast: Secondary | ICD-10-CM | POA: Diagnosis not present

## 2020-10-28 DIAGNOSIS — H25813 Combined forms of age-related cataract, bilateral: Secondary | ICD-10-CM | POA: Diagnosis not present

## 2020-10-29 DIAGNOSIS — R627 Adult failure to thrive: Secondary | ICD-10-CM | POA: Diagnosis not present

## 2020-10-29 DIAGNOSIS — K5900 Constipation, unspecified: Secondary | ICD-10-CM | POA: Diagnosis not present

## 2020-10-29 DIAGNOSIS — I509 Heart failure, unspecified: Secondary | ICD-10-CM | POA: Diagnosis not present

## 2020-10-29 DIAGNOSIS — I110 Hypertensive heart disease with heart failure: Secondary | ICD-10-CM | POA: Diagnosis not present

## 2020-10-29 DIAGNOSIS — Z853 Personal history of malignant neoplasm of breast: Secondary | ICD-10-CM | POA: Diagnosis not present

## 2020-10-29 DIAGNOSIS — M1990 Unspecified osteoarthritis, unspecified site: Secondary | ICD-10-CM | POA: Diagnosis not present

## 2020-10-29 DIAGNOSIS — G8929 Other chronic pain: Secondary | ICD-10-CM | POA: Diagnosis not present

## 2020-10-29 DIAGNOSIS — E039 Hypothyroidism, unspecified: Secondary | ICD-10-CM | POA: Diagnosis not present

## 2020-10-29 DIAGNOSIS — Z87440 Personal history of urinary (tract) infections: Secondary | ICD-10-CM | POA: Diagnosis not present

## 2020-10-29 DIAGNOSIS — M549 Dorsalgia, unspecified: Secondary | ICD-10-CM | POA: Diagnosis not present

## 2020-10-30 DIAGNOSIS — Z853 Personal history of malignant neoplasm of breast: Secondary | ICD-10-CM | POA: Diagnosis not present

## 2020-10-30 DIAGNOSIS — G8929 Other chronic pain: Secondary | ICD-10-CM | POA: Diagnosis not present

## 2020-10-30 DIAGNOSIS — I110 Hypertensive heart disease with heart failure: Secondary | ICD-10-CM | POA: Diagnosis not present

## 2020-10-30 DIAGNOSIS — M1990 Unspecified osteoarthritis, unspecified site: Secondary | ICD-10-CM | POA: Diagnosis not present

## 2020-10-30 DIAGNOSIS — R627 Adult failure to thrive: Secondary | ICD-10-CM | POA: Diagnosis not present

## 2020-10-30 DIAGNOSIS — I509 Heart failure, unspecified: Secondary | ICD-10-CM | POA: Diagnosis not present

## 2020-10-31 DIAGNOSIS — I110 Hypertensive heart disease with heart failure: Secondary | ICD-10-CM | POA: Diagnosis not present

## 2020-10-31 DIAGNOSIS — G8929 Other chronic pain: Secondary | ICD-10-CM | POA: Diagnosis not present

## 2020-10-31 DIAGNOSIS — Z853 Personal history of malignant neoplasm of breast: Secondary | ICD-10-CM | POA: Diagnosis not present

## 2020-10-31 DIAGNOSIS — I509 Heart failure, unspecified: Secondary | ICD-10-CM | POA: Diagnosis not present

## 2020-10-31 DIAGNOSIS — M1990 Unspecified osteoarthritis, unspecified site: Secondary | ICD-10-CM | POA: Diagnosis not present

## 2020-10-31 DIAGNOSIS — R627 Adult failure to thrive: Secondary | ICD-10-CM | POA: Diagnosis not present

## 2020-11-03 DIAGNOSIS — I110 Hypertensive heart disease with heart failure: Secondary | ICD-10-CM | POA: Diagnosis not present

## 2020-11-03 DIAGNOSIS — G8929 Other chronic pain: Secondary | ICD-10-CM | POA: Diagnosis not present

## 2020-11-03 DIAGNOSIS — R627 Adult failure to thrive: Secondary | ICD-10-CM | POA: Diagnosis not present

## 2020-11-03 DIAGNOSIS — I509 Heart failure, unspecified: Secondary | ICD-10-CM | POA: Diagnosis not present

## 2020-11-03 DIAGNOSIS — Z853 Personal history of malignant neoplasm of breast: Secondary | ICD-10-CM | POA: Diagnosis not present

## 2020-11-03 DIAGNOSIS — M1990 Unspecified osteoarthritis, unspecified site: Secondary | ICD-10-CM | POA: Diagnosis not present

## 2020-11-04 DIAGNOSIS — I110 Hypertensive heart disease with heart failure: Secondary | ICD-10-CM | POA: Diagnosis not present

## 2020-11-04 DIAGNOSIS — M1990 Unspecified osteoarthritis, unspecified site: Secondary | ICD-10-CM | POA: Diagnosis not present

## 2020-11-04 DIAGNOSIS — Z853 Personal history of malignant neoplasm of breast: Secondary | ICD-10-CM | POA: Diagnosis not present

## 2020-11-04 DIAGNOSIS — I509 Heart failure, unspecified: Secondary | ICD-10-CM | POA: Diagnosis not present

## 2020-11-04 DIAGNOSIS — G8929 Other chronic pain: Secondary | ICD-10-CM | POA: Diagnosis not present

## 2020-11-04 DIAGNOSIS — R627 Adult failure to thrive: Secondary | ICD-10-CM | POA: Diagnosis not present

## 2020-11-05 DIAGNOSIS — R627 Adult failure to thrive: Secondary | ICD-10-CM | POA: Diagnosis not present

## 2020-11-05 DIAGNOSIS — I110 Hypertensive heart disease with heart failure: Secondary | ICD-10-CM | POA: Diagnosis not present

## 2020-11-05 DIAGNOSIS — G8929 Other chronic pain: Secondary | ICD-10-CM | POA: Diagnosis not present

## 2020-11-05 DIAGNOSIS — I509 Heart failure, unspecified: Secondary | ICD-10-CM | POA: Diagnosis not present

## 2020-11-05 DIAGNOSIS — M1990 Unspecified osteoarthritis, unspecified site: Secondary | ICD-10-CM | POA: Diagnosis not present

## 2020-11-05 DIAGNOSIS — Z853 Personal history of malignant neoplasm of breast: Secondary | ICD-10-CM | POA: Diagnosis not present

## 2020-11-06 DIAGNOSIS — M1990 Unspecified osteoarthritis, unspecified site: Secondary | ICD-10-CM | POA: Diagnosis not present

## 2020-11-06 DIAGNOSIS — G8929 Other chronic pain: Secondary | ICD-10-CM | POA: Diagnosis not present

## 2020-11-06 DIAGNOSIS — R627 Adult failure to thrive: Secondary | ICD-10-CM | POA: Diagnosis not present

## 2020-11-06 DIAGNOSIS — I110 Hypertensive heart disease with heart failure: Secondary | ICD-10-CM | POA: Diagnosis not present

## 2020-11-06 DIAGNOSIS — I509 Heart failure, unspecified: Secondary | ICD-10-CM | POA: Diagnosis not present

## 2020-11-06 DIAGNOSIS — Z853 Personal history of malignant neoplasm of breast: Secondary | ICD-10-CM | POA: Diagnosis not present

## 2020-11-07 DIAGNOSIS — Z853 Personal history of malignant neoplasm of breast: Secondary | ICD-10-CM | POA: Diagnosis not present

## 2020-11-07 DIAGNOSIS — G8929 Other chronic pain: Secondary | ICD-10-CM | POA: Diagnosis not present

## 2020-11-07 DIAGNOSIS — I509 Heart failure, unspecified: Secondary | ICD-10-CM | POA: Diagnosis not present

## 2020-11-07 DIAGNOSIS — I110 Hypertensive heart disease with heart failure: Secondary | ICD-10-CM | POA: Diagnosis not present

## 2020-11-07 DIAGNOSIS — R627 Adult failure to thrive: Secondary | ICD-10-CM | POA: Diagnosis not present

## 2020-11-07 DIAGNOSIS — M1990 Unspecified osteoarthritis, unspecified site: Secondary | ICD-10-CM | POA: Diagnosis not present

## 2020-11-10 DIAGNOSIS — I110 Hypertensive heart disease with heart failure: Secondary | ICD-10-CM | POA: Diagnosis not present

## 2020-11-10 DIAGNOSIS — R627 Adult failure to thrive: Secondary | ICD-10-CM | POA: Diagnosis not present

## 2020-11-10 DIAGNOSIS — Z853 Personal history of malignant neoplasm of breast: Secondary | ICD-10-CM | POA: Diagnosis not present

## 2020-11-10 DIAGNOSIS — M1990 Unspecified osteoarthritis, unspecified site: Secondary | ICD-10-CM | POA: Diagnosis not present

## 2020-11-10 DIAGNOSIS — I509 Heart failure, unspecified: Secondary | ICD-10-CM | POA: Diagnosis not present

## 2020-11-10 DIAGNOSIS — G8929 Other chronic pain: Secondary | ICD-10-CM | POA: Diagnosis not present

## 2020-11-11 DIAGNOSIS — I509 Heart failure, unspecified: Secondary | ICD-10-CM | POA: Diagnosis not present

## 2020-11-11 DIAGNOSIS — R627 Adult failure to thrive: Secondary | ICD-10-CM | POA: Diagnosis not present

## 2020-11-11 DIAGNOSIS — M1990 Unspecified osteoarthritis, unspecified site: Secondary | ICD-10-CM | POA: Diagnosis not present

## 2020-11-11 DIAGNOSIS — G8929 Other chronic pain: Secondary | ICD-10-CM | POA: Diagnosis not present

## 2020-11-11 DIAGNOSIS — Z853 Personal history of malignant neoplasm of breast: Secondary | ICD-10-CM | POA: Diagnosis not present

## 2020-11-11 DIAGNOSIS — I110 Hypertensive heart disease with heart failure: Secondary | ICD-10-CM | POA: Diagnosis not present

## 2020-11-12 DIAGNOSIS — I509 Heart failure, unspecified: Secondary | ICD-10-CM | POA: Diagnosis not present

## 2020-11-12 DIAGNOSIS — M1990 Unspecified osteoarthritis, unspecified site: Secondary | ICD-10-CM | POA: Diagnosis not present

## 2020-11-12 DIAGNOSIS — R627 Adult failure to thrive: Secondary | ICD-10-CM | POA: Diagnosis not present

## 2020-11-12 DIAGNOSIS — Z853 Personal history of malignant neoplasm of breast: Secondary | ICD-10-CM | POA: Diagnosis not present

## 2020-11-12 DIAGNOSIS — I110 Hypertensive heart disease with heart failure: Secondary | ICD-10-CM | POA: Diagnosis not present

## 2020-11-12 DIAGNOSIS — G8929 Other chronic pain: Secondary | ICD-10-CM | POA: Diagnosis not present

## 2020-11-14 DIAGNOSIS — M1990 Unspecified osteoarthritis, unspecified site: Secondary | ICD-10-CM | POA: Diagnosis not present

## 2020-11-14 DIAGNOSIS — I509 Heart failure, unspecified: Secondary | ICD-10-CM | POA: Diagnosis not present

## 2020-11-14 DIAGNOSIS — I110 Hypertensive heart disease with heart failure: Secondary | ICD-10-CM | POA: Diagnosis not present

## 2020-11-14 DIAGNOSIS — G8929 Other chronic pain: Secondary | ICD-10-CM | POA: Diagnosis not present

## 2020-11-14 DIAGNOSIS — R627 Adult failure to thrive: Secondary | ICD-10-CM | POA: Diagnosis not present

## 2020-11-14 DIAGNOSIS — Z853 Personal history of malignant neoplasm of breast: Secondary | ICD-10-CM | POA: Diagnosis not present

## 2020-11-17 DIAGNOSIS — G8929 Other chronic pain: Secondary | ICD-10-CM | POA: Diagnosis not present

## 2020-11-17 DIAGNOSIS — I509 Heart failure, unspecified: Secondary | ICD-10-CM | POA: Diagnosis not present

## 2020-11-17 DIAGNOSIS — I110 Hypertensive heart disease with heart failure: Secondary | ICD-10-CM | POA: Diagnosis not present

## 2020-11-17 DIAGNOSIS — M1990 Unspecified osteoarthritis, unspecified site: Secondary | ICD-10-CM | POA: Diagnosis not present

## 2020-11-17 DIAGNOSIS — Z853 Personal history of malignant neoplasm of breast: Secondary | ICD-10-CM | POA: Diagnosis not present

## 2020-11-17 DIAGNOSIS — R627 Adult failure to thrive: Secondary | ICD-10-CM | POA: Diagnosis not present

## 2020-11-18 DIAGNOSIS — M1990 Unspecified osteoarthritis, unspecified site: Secondary | ICD-10-CM | POA: Diagnosis not present

## 2020-11-18 DIAGNOSIS — Z853 Personal history of malignant neoplasm of breast: Secondary | ICD-10-CM | POA: Diagnosis not present

## 2020-11-18 DIAGNOSIS — I509 Heart failure, unspecified: Secondary | ICD-10-CM | POA: Diagnosis not present

## 2020-11-18 DIAGNOSIS — G8929 Other chronic pain: Secondary | ICD-10-CM | POA: Diagnosis not present

## 2020-11-18 DIAGNOSIS — I110 Hypertensive heart disease with heart failure: Secondary | ICD-10-CM | POA: Diagnosis not present

## 2020-11-18 DIAGNOSIS — R627 Adult failure to thrive: Secondary | ICD-10-CM | POA: Diagnosis not present

## 2020-11-19 DIAGNOSIS — G8929 Other chronic pain: Secondary | ICD-10-CM | POA: Diagnosis not present

## 2020-11-19 DIAGNOSIS — R627 Adult failure to thrive: Secondary | ICD-10-CM | POA: Diagnosis not present

## 2020-11-19 DIAGNOSIS — Z853 Personal history of malignant neoplasm of breast: Secondary | ICD-10-CM | POA: Diagnosis not present

## 2020-11-19 DIAGNOSIS — I509 Heart failure, unspecified: Secondary | ICD-10-CM | POA: Diagnosis not present

## 2020-11-19 DIAGNOSIS — M1990 Unspecified osteoarthritis, unspecified site: Secondary | ICD-10-CM | POA: Diagnosis not present

## 2020-11-19 DIAGNOSIS — I110 Hypertensive heart disease with heart failure: Secondary | ICD-10-CM | POA: Diagnosis not present

## 2020-11-20 DIAGNOSIS — I509 Heart failure, unspecified: Secondary | ICD-10-CM | POA: Diagnosis not present

## 2020-11-20 DIAGNOSIS — Z853 Personal history of malignant neoplasm of breast: Secondary | ICD-10-CM | POA: Diagnosis not present

## 2020-11-20 DIAGNOSIS — R627 Adult failure to thrive: Secondary | ICD-10-CM | POA: Diagnosis not present

## 2020-11-20 DIAGNOSIS — M1990 Unspecified osteoarthritis, unspecified site: Secondary | ICD-10-CM | POA: Diagnosis not present

## 2020-11-20 DIAGNOSIS — I110 Hypertensive heart disease with heart failure: Secondary | ICD-10-CM | POA: Diagnosis not present

## 2020-11-20 DIAGNOSIS — G8929 Other chronic pain: Secondary | ICD-10-CM | POA: Diagnosis not present

## 2020-11-21 DIAGNOSIS — R627 Adult failure to thrive: Secondary | ICD-10-CM | POA: Diagnosis not present

## 2020-11-21 DIAGNOSIS — Z853 Personal history of malignant neoplasm of breast: Secondary | ICD-10-CM | POA: Diagnosis not present

## 2020-11-21 DIAGNOSIS — I509 Heart failure, unspecified: Secondary | ICD-10-CM | POA: Diagnosis not present

## 2020-11-21 DIAGNOSIS — G8929 Other chronic pain: Secondary | ICD-10-CM | POA: Diagnosis not present

## 2020-11-21 DIAGNOSIS — I110 Hypertensive heart disease with heart failure: Secondary | ICD-10-CM | POA: Diagnosis not present

## 2020-11-21 DIAGNOSIS — M1990 Unspecified osteoarthritis, unspecified site: Secondary | ICD-10-CM | POA: Diagnosis not present

## 2020-11-24 DIAGNOSIS — I509 Heart failure, unspecified: Secondary | ICD-10-CM | POA: Diagnosis not present

## 2020-11-24 DIAGNOSIS — I110 Hypertensive heart disease with heart failure: Secondary | ICD-10-CM | POA: Diagnosis not present

## 2020-11-24 DIAGNOSIS — R627 Adult failure to thrive: Secondary | ICD-10-CM | POA: Diagnosis not present

## 2020-11-24 DIAGNOSIS — M1990 Unspecified osteoarthritis, unspecified site: Secondary | ICD-10-CM | POA: Diagnosis not present

## 2020-11-24 DIAGNOSIS — Z853 Personal history of malignant neoplasm of breast: Secondary | ICD-10-CM | POA: Diagnosis not present

## 2020-11-24 DIAGNOSIS — G8929 Other chronic pain: Secondary | ICD-10-CM | POA: Diagnosis not present

## 2020-11-25 DIAGNOSIS — G8929 Other chronic pain: Secondary | ICD-10-CM | POA: Diagnosis not present

## 2020-11-25 DIAGNOSIS — M1990 Unspecified osteoarthritis, unspecified site: Secondary | ICD-10-CM | POA: Diagnosis not present

## 2020-11-25 DIAGNOSIS — I509 Heart failure, unspecified: Secondary | ICD-10-CM | POA: Diagnosis not present

## 2020-11-25 DIAGNOSIS — Z853 Personal history of malignant neoplasm of breast: Secondary | ICD-10-CM | POA: Diagnosis not present

## 2020-11-25 DIAGNOSIS — I110 Hypertensive heart disease with heart failure: Secondary | ICD-10-CM | POA: Diagnosis not present

## 2020-11-25 DIAGNOSIS — R627 Adult failure to thrive: Secondary | ICD-10-CM | POA: Diagnosis not present

## 2020-11-26 DIAGNOSIS — Z853 Personal history of malignant neoplasm of breast: Secondary | ICD-10-CM | POA: Diagnosis not present

## 2020-11-26 DIAGNOSIS — R627 Adult failure to thrive: Secondary | ICD-10-CM | POA: Diagnosis not present

## 2020-11-26 DIAGNOSIS — I509 Heart failure, unspecified: Secondary | ICD-10-CM | POA: Diagnosis not present

## 2020-11-26 DIAGNOSIS — M1990 Unspecified osteoarthritis, unspecified site: Secondary | ICD-10-CM | POA: Diagnosis not present

## 2020-11-26 DIAGNOSIS — I110 Hypertensive heart disease with heart failure: Secondary | ICD-10-CM | POA: Diagnosis not present

## 2020-11-26 DIAGNOSIS — G8929 Other chronic pain: Secondary | ICD-10-CM | POA: Diagnosis not present

## 2020-11-28 DIAGNOSIS — K5900 Constipation, unspecified: Secondary | ICD-10-CM | POA: Diagnosis not present

## 2020-11-28 DIAGNOSIS — I110 Hypertensive heart disease with heart failure: Secondary | ICD-10-CM | POA: Diagnosis not present

## 2020-11-28 DIAGNOSIS — E039 Hypothyroidism, unspecified: Secondary | ICD-10-CM | POA: Diagnosis not present

## 2020-11-28 DIAGNOSIS — Z853 Personal history of malignant neoplasm of breast: Secondary | ICD-10-CM | POA: Diagnosis not present

## 2020-11-28 DIAGNOSIS — M549 Dorsalgia, unspecified: Secondary | ICD-10-CM | POA: Diagnosis not present

## 2020-11-28 DIAGNOSIS — I509 Heart failure, unspecified: Secondary | ICD-10-CM | POA: Diagnosis not present

## 2020-11-28 DIAGNOSIS — Z87440 Personal history of urinary (tract) infections: Secondary | ICD-10-CM | POA: Diagnosis not present

## 2020-11-28 DIAGNOSIS — M1990 Unspecified osteoarthritis, unspecified site: Secondary | ICD-10-CM | POA: Diagnosis not present

## 2020-11-28 DIAGNOSIS — G8929 Other chronic pain: Secondary | ICD-10-CM | POA: Diagnosis not present

## 2020-11-28 DIAGNOSIS — R627 Adult failure to thrive: Secondary | ICD-10-CM | POA: Diagnosis not present

## 2020-12-03 DIAGNOSIS — R627 Adult failure to thrive: Secondary | ICD-10-CM | POA: Diagnosis not present

## 2020-12-03 DIAGNOSIS — Z853 Personal history of malignant neoplasm of breast: Secondary | ICD-10-CM | POA: Diagnosis not present

## 2020-12-03 DIAGNOSIS — I509 Heart failure, unspecified: Secondary | ICD-10-CM | POA: Diagnosis not present

## 2020-12-03 DIAGNOSIS — I110 Hypertensive heart disease with heart failure: Secondary | ICD-10-CM | POA: Diagnosis not present

## 2020-12-03 DIAGNOSIS — G8929 Other chronic pain: Secondary | ICD-10-CM | POA: Diagnosis not present

## 2020-12-03 DIAGNOSIS — M1990 Unspecified osteoarthritis, unspecified site: Secondary | ICD-10-CM | POA: Diagnosis not present

## 2020-12-05 DIAGNOSIS — I110 Hypertensive heart disease with heart failure: Secondary | ICD-10-CM | POA: Diagnosis not present

## 2020-12-05 DIAGNOSIS — Z853 Personal history of malignant neoplasm of breast: Secondary | ICD-10-CM | POA: Diagnosis not present

## 2020-12-05 DIAGNOSIS — R627 Adult failure to thrive: Secondary | ICD-10-CM | POA: Diagnosis not present

## 2020-12-05 DIAGNOSIS — M1990 Unspecified osteoarthritis, unspecified site: Secondary | ICD-10-CM | POA: Diagnosis not present

## 2020-12-05 DIAGNOSIS — G8929 Other chronic pain: Secondary | ICD-10-CM | POA: Diagnosis not present

## 2020-12-05 DIAGNOSIS — I509 Heart failure, unspecified: Secondary | ICD-10-CM | POA: Diagnosis not present

## 2020-12-08 DIAGNOSIS — Z853 Personal history of malignant neoplasm of breast: Secondary | ICD-10-CM | POA: Diagnosis not present

## 2020-12-08 DIAGNOSIS — I110 Hypertensive heart disease with heart failure: Secondary | ICD-10-CM | POA: Diagnosis not present

## 2020-12-08 DIAGNOSIS — R627 Adult failure to thrive: Secondary | ICD-10-CM | POA: Diagnosis not present

## 2020-12-08 DIAGNOSIS — G8929 Other chronic pain: Secondary | ICD-10-CM | POA: Diagnosis not present

## 2020-12-08 DIAGNOSIS — I509 Heart failure, unspecified: Secondary | ICD-10-CM | POA: Diagnosis not present

## 2020-12-08 DIAGNOSIS — M1990 Unspecified osteoarthritis, unspecified site: Secondary | ICD-10-CM | POA: Diagnosis not present

## 2020-12-09 DIAGNOSIS — M1990 Unspecified osteoarthritis, unspecified site: Secondary | ICD-10-CM | POA: Diagnosis not present

## 2020-12-09 DIAGNOSIS — I110 Hypertensive heart disease with heart failure: Secondary | ICD-10-CM | POA: Diagnosis not present

## 2020-12-09 DIAGNOSIS — I509 Heart failure, unspecified: Secondary | ICD-10-CM | POA: Diagnosis not present

## 2020-12-09 DIAGNOSIS — R627 Adult failure to thrive: Secondary | ICD-10-CM | POA: Diagnosis not present

## 2020-12-09 DIAGNOSIS — G8929 Other chronic pain: Secondary | ICD-10-CM | POA: Diagnosis not present

## 2020-12-09 DIAGNOSIS — Z853 Personal history of malignant neoplasm of breast: Secondary | ICD-10-CM | POA: Diagnosis not present

## 2020-12-10 DIAGNOSIS — I110 Hypertensive heart disease with heart failure: Secondary | ICD-10-CM | POA: Diagnosis not present

## 2020-12-10 DIAGNOSIS — M1990 Unspecified osteoarthritis, unspecified site: Secondary | ICD-10-CM | POA: Diagnosis not present

## 2020-12-10 DIAGNOSIS — G8929 Other chronic pain: Secondary | ICD-10-CM | POA: Diagnosis not present

## 2020-12-10 DIAGNOSIS — I509 Heart failure, unspecified: Secondary | ICD-10-CM | POA: Diagnosis not present

## 2020-12-10 DIAGNOSIS — Z853 Personal history of malignant neoplasm of breast: Secondary | ICD-10-CM | POA: Diagnosis not present

## 2020-12-10 DIAGNOSIS — R627 Adult failure to thrive: Secondary | ICD-10-CM | POA: Diagnosis not present

## 2020-12-12 DIAGNOSIS — I110 Hypertensive heart disease with heart failure: Secondary | ICD-10-CM | POA: Diagnosis not present

## 2020-12-12 DIAGNOSIS — R627 Adult failure to thrive: Secondary | ICD-10-CM | POA: Diagnosis not present

## 2020-12-12 DIAGNOSIS — I509 Heart failure, unspecified: Secondary | ICD-10-CM | POA: Diagnosis not present

## 2020-12-12 DIAGNOSIS — M1990 Unspecified osteoarthritis, unspecified site: Secondary | ICD-10-CM | POA: Diagnosis not present

## 2020-12-12 DIAGNOSIS — Z853 Personal history of malignant neoplasm of breast: Secondary | ICD-10-CM | POA: Diagnosis not present

## 2020-12-12 DIAGNOSIS — G8929 Other chronic pain: Secondary | ICD-10-CM | POA: Diagnosis not present

## 2020-12-15 DIAGNOSIS — I509 Heart failure, unspecified: Secondary | ICD-10-CM | POA: Diagnosis not present

## 2020-12-15 DIAGNOSIS — I110 Hypertensive heart disease with heart failure: Secondary | ICD-10-CM | POA: Diagnosis not present

## 2020-12-15 DIAGNOSIS — G8929 Other chronic pain: Secondary | ICD-10-CM | POA: Diagnosis not present

## 2020-12-15 DIAGNOSIS — Z853 Personal history of malignant neoplasm of breast: Secondary | ICD-10-CM | POA: Diagnosis not present

## 2020-12-15 DIAGNOSIS — M1990 Unspecified osteoarthritis, unspecified site: Secondary | ICD-10-CM | POA: Diagnosis not present

## 2020-12-15 DIAGNOSIS — R627 Adult failure to thrive: Secondary | ICD-10-CM | POA: Diagnosis not present

## 2020-12-17 DIAGNOSIS — R627 Adult failure to thrive: Secondary | ICD-10-CM | POA: Diagnosis not present

## 2020-12-17 DIAGNOSIS — I509 Heart failure, unspecified: Secondary | ICD-10-CM | POA: Diagnosis not present

## 2020-12-17 DIAGNOSIS — I110 Hypertensive heart disease with heart failure: Secondary | ICD-10-CM | POA: Diagnosis not present

## 2020-12-17 DIAGNOSIS — G8929 Other chronic pain: Secondary | ICD-10-CM | POA: Diagnosis not present

## 2020-12-17 DIAGNOSIS — M1990 Unspecified osteoarthritis, unspecified site: Secondary | ICD-10-CM | POA: Diagnosis not present

## 2020-12-17 DIAGNOSIS — Z853 Personal history of malignant neoplasm of breast: Secondary | ICD-10-CM | POA: Diagnosis not present

## 2020-12-18 DIAGNOSIS — R627 Adult failure to thrive: Secondary | ICD-10-CM | POA: Diagnosis not present

## 2020-12-18 DIAGNOSIS — G8929 Other chronic pain: Secondary | ICD-10-CM | POA: Diagnosis not present

## 2020-12-18 DIAGNOSIS — M1990 Unspecified osteoarthritis, unspecified site: Secondary | ICD-10-CM | POA: Diagnosis not present

## 2020-12-18 DIAGNOSIS — I110 Hypertensive heart disease with heart failure: Secondary | ICD-10-CM | POA: Diagnosis not present

## 2020-12-18 DIAGNOSIS — Z853 Personal history of malignant neoplasm of breast: Secondary | ICD-10-CM | POA: Diagnosis not present

## 2020-12-18 DIAGNOSIS — I509 Heart failure, unspecified: Secondary | ICD-10-CM | POA: Diagnosis not present

## 2020-12-19 DIAGNOSIS — G8929 Other chronic pain: Secondary | ICD-10-CM | POA: Diagnosis not present

## 2020-12-19 DIAGNOSIS — Z853 Personal history of malignant neoplasm of breast: Secondary | ICD-10-CM | POA: Diagnosis not present

## 2020-12-19 DIAGNOSIS — M1990 Unspecified osteoarthritis, unspecified site: Secondary | ICD-10-CM | POA: Diagnosis not present

## 2020-12-19 DIAGNOSIS — R627 Adult failure to thrive: Secondary | ICD-10-CM | POA: Diagnosis not present

## 2020-12-19 DIAGNOSIS — I110 Hypertensive heart disease with heart failure: Secondary | ICD-10-CM | POA: Diagnosis not present

## 2020-12-19 DIAGNOSIS — I509 Heart failure, unspecified: Secondary | ICD-10-CM | POA: Diagnosis not present

## 2020-12-22 DIAGNOSIS — R627 Adult failure to thrive: Secondary | ICD-10-CM | POA: Diagnosis not present

## 2020-12-22 DIAGNOSIS — I509 Heart failure, unspecified: Secondary | ICD-10-CM | POA: Diagnosis not present

## 2020-12-22 DIAGNOSIS — Z853 Personal history of malignant neoplasm of breast: Secondary | ICD-10-CM | POA: Diagnosis not present

## 2020-12-22 DIAGNOSIS — I110 Hypertensive heart disease with heart failure: Secondary | ICD-10-CM | POA: Diagnosis not present

## 2020-12-22 DIAGNOSIS — M1990 Unspecified osteoarthritis, unspecified site: Secondary | ICD-10-CM | POA: Diagnosis not present

## 2020-12-22 DIAGNOSIS — G8929 Other chronic pain: Secondary | ICD-10-CM | POA: Diagnosis not present

## 2020-12-23 DIAGNOSIS — Z853 Personal history of malignant neoplasm of breast: Secondary | ICD-10-CM | POA: Diagnosis not present

## 2020-12-23 DIAGNOSIS — M1990 Unspecified osteoarthritis, unspecified site: Secondary | ICD-10-CM | POA: Diagnosis not present

## 2020-12-23 DIAGNOSIS — G8929 Other chronic pain: Secondary | ICD-10-CM | POA: Diagnosis not present

## 2020-12-23 DIAGNOSIS — I509 Heart failure, unspecified: Secondary | ICD-10-CM | POA: Diagnosis not present

## 2020-12-23 DIAGNOSIS — I110 Hypertensive heart disease with heart failure: Secondary | ICD-10-CM | POA: Diagnosis not present

## 2020-12-23 DIAGNOSIS — R627 Adult failure to thrive: Secondary | ICD-10-CM | POA: Diagnosis not present

## 2020-12-24 DIAGNOSIS — I509 Heart failure, unspecified: Secondary | ICD-10-CM | POA: Diagnosis not present

## 2020-12-24 DIAGNOSIS — R627 Adult failure to thrive: Secondary | ICD-10-CM | POA: Diagnosis not present

## 2020-12-24 DIAGNOSIS — I110 Hypertensive heart disease with heart failure: Secondary | ICD-10-CM | POA: Diagnosis not present

## 2020-12-24 DIAGNOSIS — G8929 Other chronic pain: Secondary | ICD-10-CM | POA: Diagnosis not present

## 2020-12-24 DIAGNOSIS — M1990 Unspecified osteoarthritis, unspecified site: Secondary | ICD-10-CM | POA: Diagnosis not present

## 2020-12-24 DIAGNOSIS — Z853 Personal history of malignant neoplasm of breast: Secondary | ICD-10-CM | POA: Diagnosis not present

## 2020-12-25 DIAGNOSIS — M1990 Unspecified osteoarthritis, unspecified site: Secondary | ICD-10-CM | POA: Diagnosis not present

## 2020-12-25 DIAGNOSIS — R627 Adult failure to thrive: Secondary | ICD-10-CM | POA: Diagnosis not present

## 2020-12-25 DIAGNOSIS — I110 Hypertensive heart disease with heart failure: Secondary | ICD-10-CM | POA: Diagnosis not present

## 2020-12-25 DIAGNOSIS — G8929 Other chronic pain: Secondary | ICD-10-CM | POA: Diagnosis not present

## 2020-12-25 DIAGNOSIS — I509 Heart failure, unspecified: Secondary | ICD-10-CM | POA: Diagnosis not present

## 2020-12-25 DIAGNOSIS — Z853 Personal history of malignant neoplasm of breast: Secondary | ICD-10-CM | POA: Diagnosis not present

## 2020-12-26 DIAGNOSIS — R627 Adult failure to thrive: Secondary | ICD-10-CM | POA: Diagnosis not present

## 2020-12-26 DIAGNOSIS — G8929 Other chronic pain: Secondary | ICD-10-CM | POA: Diagnosis not present

## 2020-12-26 DIAGNOSIS — M1990 Unspecified osteoarthritis, unspecified site: Secondary | ICD-10-CM | POA: Diagnosis not present

## 2020-12-26 DIAGNOSIS — Z853 Personal history of malignant neoplasm of breast: Secondary | ICD-10-CM | POA: Diagnosis not present

## 2020-12-26 DIAGNOSIS — I509 Heart failure, unspecified: Secondary | ICD-10-CM | POA: Diagnosis not present

## 2020-12-26 DIAGNOSIS — I110 Hypertensive heart disease with heart failure: Secondary | ICD-10-CM | POA: Diagnosis not present

## 2020-12-29 DIAGNOSIS — R627 Adult failure to thrive: Secondary | ICD-10-CM | POA: Diagnosis not present

## 2020-12-29 DIAGNOSIS — M1990 Unspecified osteoarthritis, unspecified site: Secondary | ICD-10-CM | POA: Diagnosis not present

## 2020-12-29 DIAGNOSIS — Z853 Personal history of malignant neoplasm of breast: Secondary | ICD-10-CM | POA: Diagnosis not present

## 2020-12-29 DIAGNOSIS — I509 Heart failure, unspecified: Secondary | ICD-10-CM | POA: Diagnosis not present

## 2020-12-29 DIAGNOSIS — G8929 Other chronic pain: Secondary | ICD-10-CM | POA: Diagnosis not present

## 2020-12-29 DIAGNOSIS — K5900 Constipation, unspecified: Secondary | ICD-10-CM | POA: Diagnosis not present

## 2020-12-29 DIAGNOSIS — Z87440 Personal history of urinary (tract) infections: Secondary | ICD-10-CM | POA: Diagnosis not present

## 2020-12-29 DIAGNOSIS — E039 Hypothyroidism, unspecified: Secondary | ICD-10-CM | POA: Diagnosis not present

## 2020-12-29 DIAGNOSIS — M549 Dorsalgia, unspecified: Secondary | ICD-10-CM | POA: Diagnosis not present

## 2020-12-29 DIAGNOSIS — I110 Hypertensive heart disease with heart failure: Secondary | ICD-10-CM | POA: Diagnosis not present

## 2020-12-31 DIAGNOSIS — Z853 Personal history of malignant neoplasm of breast: Secondary | ICD-10-CM | POA: Diagnosis not present

## 2020-12-31 DIAGNOSIS — G8929 Other chronic pain: Secondary | ICD-10-CM | POA: Diagnosis not present

## 2020-12-31 DIAGNOSIS — M1990 Unspecified osteoarthritis, unspecified site: Secondary | ICD-10-CM | POA: Diagnosis not present

## 2020-12-31 DIAGNOSIS — I110 Hypertensive heart disease with heart failure: Secondary | ICD-10-CM | POA: Diagnosis not present

## 2020-12-31 DIAGNOSIS — I509 Heart failure, unspecified: Secondary | ICD-10-CM | POA: Diagnosis not present

## 2020-12-31 DIAGNOSIS — R627 Adult failure to thrive: Secondary | ICD-10-CM | POA: Diagnosis not present

## 2021-01-01 DIAGNOSIS — Z853 Personal history of malignant neoplasm of breast: Secondary | ICD-10-CM | POA: Diagnosis not present

## 2021-01-01 DIAGNOSIS — R627 Adult failure to thrive: Secondary | ICD-10-CM | POA: Diagnosis not present

## 2021-01-01 DIAGNOSIS — G8929 Other chronic pain: Secondary | ICD-10-CM | POA: Diagnosis not present

## 2021-01-01 DIAGNOSIS — I110 Hypertensive heart disease with heart failure: Secondary | ICD-10-CM | POA: Diagnosis not present

## 2021-01-01 DIAGNOSIS — I509 Heart failure, unspecified: Secondary | ICD-10-CM | POA: Diagnosis not present

## 2021-01-01 DIAGNOSIS — M1990 Unspecified osteoarthritis, unspecified site: Secondary | ICD-10-CM | POA: Diagnosis not present

## 2021-01-02 DIAGNOSIS — Z853 Personal history of malignant neoplasm of breast: Secondary | ICD-10-CM | POA: Diagnosis not present

## 2021-01-02 DIAGNOSIS — R627 Adult failure to thrive: Secondary | ICD-10-CM | POA: Diagnosis not present

## 2021-01-02 DIAGNOSIS — I110 Hypertensive heart disease with heart failure: Secondary | ICD-10-CM | POA: Diagnosis not present

## 2021-01-02 DIAGNOSIS — G8929 Other chronic pain: Secondary | ICD-10-CM | POA: Diagnosis not present

## 2021-01-02 DIAGNOSIS — I509 Heart failure, unspecified: Secondary | ICD-10-CM | POA: Diagnosis not present

## 2021-01-02 DIAGNOSIS — M1990 Unspecified osteoarthritis, unspecified site: Secondary | ICD-10-CM | POA: Diagnosis not present

## 2021-01-05 DIAGNOSIS — M1990 Unspecified osteoarthritis, unspecified site: Secondary | ICD-10-CM | POA: Diagnosis not present

## 2021-01-05 DIAGNOSIS — Z853 Personal history of malignant neoplasm of breast: Secondary | ICD-10-CM | POA: Diagnosis not present

## 2021-01-05 DIAGNOSIS — G8929 Other chronic pain: Secondary | ICD-10-CM | POA: Diagnosis not present

## 2021-01-05 DIAGNOSIS — R627 Adult failure to thrive: Secondary | ICD-10-CM | POA: Diagnosis not present

## 2021-01-05 DIAGNOSIS — I509 Heart failure, unspecified: Secondary | ICD-10-CM | POA: Diagnosis not present

## 2021-01-05 DIAGNOSIS — I110 Hypertensive heart disease with heart failure: Secondary | ICD-10-CM | POA: Diagnosis not present

## 2021-01-06 DIAGNOSIS — I110 Hypertensive heart disease with heart failure: Secondary | ICD-10-CM | POA: Diagnosis not present

## 2021-01-06 DIAGNOSIS — I509 Heart failure, unspecified: Secondary | ICD-10-CM | POA: Diagnosis not present

## 2021-01-06 DIAGNOSIS — Z853 Personal history of malignant neoplasm of breast: Secondary | ICD-10-CM | POA: Diagnosis not present

## 2021-01-06 DIAGNOSIS — M1990 Unspecified osteoarthritis, unspecified site: Secondary | ICD-10-CM | POA: Diagnosis not present

## 2021-01-06 DIAGNOSIS — R627 Adult failure to thrive: Secondary | ICD-10-CM | POA: Diagnosis not present

## 2021-01-06 DIAGNOSIS — G8929 Other chronic pain: Secondary | ICD-10-CM | POA: Diagnosis not present

## 2021-01-07 DIAGNOSIS — G8929 Other chronic pain: Secondary | ICD-10-CM | POA: Diagnosis not present

## 2021-01-07 DIAGNOSIS — M1990 Unspecified osteoarthritis, unspecified site: Secondary | ICD-10-CM | POA: Diagnosis not present

## 2021-01-07 DIAGNOSIS — I509 Heart failure, unspecified: Secondary | ICD-10-CM | POA: Diagnosis not present

## 2021-01-07 DIAGNOSIS — Z853 Personal history of malignant neoplasm of breast: Secondary | ICD-10-CM | POA: Diagnosis not present

## 2021-01-07 DIAGNOSIS — R627 Adult failure to thrive: Secondary | ICD-10-CM | POA: Diagnosis not present

## 2021-01-07 DIAGNOSIS — I110 Hypertensive heart disease with heart failure: Secondary | ICD-10-CM | POA: Diagnosis not present

## 2021-01-08 DIAGNOSIS — M1990 Unspecified osteoarthritis, unspecified site: Secondary | ICD-10-CM | POA: Diagnosis not present

## 2021-01-08 DIAGNOSIS — Z853 Personal history of malignant neoplasm of breast: Secondary | ICD-10-CM | POA: Diagnosis not present

## 2021-01-08 DIAGNOSIS — R627 Adult failure to thrive: Secondary | ICD-10-CM | POA: Diagnosis not present

## 2021-01-08 DIAGNOSIS — I110 Hypertensive heart disease with heart failure: Secondary | ICD-10-CM | POA: Diagnosis not present

## 2021-01-08 DIAGNOSIS — G8929 Other chronic pain: Secondary | ICD-10-CM | POA: Diagnosis not present

## 2021-01-08 DIAGNOSIS — I509 Heart failure, unspecified: Secondary | ICD-10-CM | POA: Diagnosis not present

## 2021-01-09 DIAGNOSIS — I110 Hypertensive heart disease with heart failure: Secondary | ICD-10-CM | POA: Diagnosis not present

## 2021-01-09 DIAGNOSIS — I509 Heart failure, unspecified: Secondary | ICD-10-CM | POA: Diagnosis not present

## 2021-01-09 DIAGNOSIS — G8929 Other chronic pain: Secondary | ICD-10-CM | POA: Diagnosis not present

## 2021-01-09 DIAGNOSIS — Z853 Personal history of malignant neoplasm of breast: Secondary | ICD-10-CM | POA: Diagnosis not present

## 2021-01-09 DIAGNOSIS — M1990 Unspecified osteoarthritis, unspecified site: Secondary | ICD-10-CM | POA: Diagnosis not present

## 2021-01-09 DIAGNOSIS — R627 Adult failure to thrive: Secondary | ICD-10-CM | POA: Diagnosis not present

## 2021-01-12 DIAGNOSIS — G8929 Other chronic pain: Secondary | ICD-10-CM | POA: Diagnosis not present

## 2021-01-12 DIAGNOSIS — M1990 Unspecified osteoarthritis, unspecified site: Secondary | ICD-10-CM | POA: Diagnosis not present

## 2021-01-12 DIAGNOSIS — Z853 Personal history of malignant neoplasm of breast: Secondary | ICD-10-CM | POA: Diagnosis not present

## 2021-01-12 DIAGNOSIS — I110 Hypertensive heart disease with heart failure: Secondary | ICD-10-CM | POA: Diagnosis not present

## 2021-01-12 DIAGNOSIS — I509 Heart failure, unspecified: Secondary | ICD-10-CM | POA: Diagnosis not present

## 2021-01-12 DIAGNOSIS — R627 Adult failure to thrive: Secondary | ICD-10-CM | POA: Diagnosis not present

## 2021-01-13 DIAGNOSIS — Z853 Personal history of malignant neoplasm of breast: Secondary | ICD-10-CM | POA: Diagnosis not present

## 2021-01-13 DIAGNOSIS — I509 Heart failure, unspecified: Secondary | ICD-10-CM | POA: Diagnosis not present

## 2021-01-13 DIAGNOSIS — G8929 Other chronic pain: Secondary | ICD-10-CM | POA: Diagnosis not present

## 2021-01-13 DIAGNOSIS — I110 Hypertensive heart disease with heart failure: Secondary | ICD-10-CM | POA: Diagnosis not present

## 2021-01-13 DIAGNOSIS — M1990 Unspecified osteoarthritis, unspecified site: Secondary | ICD-10-CM | POA: Diagnosis not present

## 2021-01-13 DIAGNOSIS — R627 Adult failure to thrive: Secondary | ICD-10-CM | POA: Diagnosis not present

## 2021-01-14 DIAGNOSIS — I509 Heart failure, unspecified: Secondary | ICD-10-CM | POA: Diagnosis not present

## 2021-01-14 DIAGNOSIS — I110 Hypertensive heart disease with heart failure: Secondary | ICD-10-CM | POA: Diagnosis not present

## 2021-01-14 DIAGNOSIS — Z853 Personal history of malignant neoplasm of breast: Secondary | ICD-10-CM | POA: Diagnosis not present

## 2021-01-14 DIAGNOSIS — G8929 Other chronic pain: Secondary | ICD-10-CM | POA: Diagnosis not present

## 2021-01-14 DIAGNOSIS — R627 Adult failure to thrive: Secondary | ICD-10-CM | POA: Diagnosis not present

## 2021-01-14 DIAGNOSIS — M1990 Unspecified osteoarthritis, unspecified site: Secondary | ICD-10-CM | POA: Diagnosis not present

## 2021-01-15 DIAGNOSIS — M1990 Unspecified osteoarthritis, unspecified site: Secondary | ICD-10-CM | POA: Diagnosis not present

## 2021-01-15 DIAGNOSIS — I110 Hypertensive heart disease with heart failure: Secondary | ICD-10-CM | POA: Diagnosis not present

## 2021-01-15 DIAGNOSIS — I509 Heart failure, unspecified: Secondary | ICD-10-CM | POA: Diagnosis not present

## 2021-01-15 DIAGNOSIS — R627 Adult failure to thrive: Secondary | ICD-10-CM | POA: Diagnosis not present

## 2021-01-15 DIAGNOSIS — Z853 Personal history of malignant neoplasm of breast: Secondary | ICD-10-CM | POA: Diagnosis not present

## 2021-01-15 DIAGNOSIS — G8929 Other chronic pain: Secondary | ICD-10-CM | POA: Diagnosis not present

## 2021-01-16 DIAGNOSIS — R627 Adult failure to thrive: Secondary | ICD-10-CM | POA: Diagnosis not present

## 2021-01-16 DIAGNOSIS — I509 Heart failure, unspecified: Secondary | ICD-10-CM | POA: Diagnosis not present

## 2021-01-16 DIAGNOSIS — M1990 Unspecified osteoarthritis, unspecified site: Secondary | ICD-10-CM | POA: Diagnosis not present

## 2021-01-16 DIAGNOSIS — Z853 Personal history of malignant neoplasm of breast: Secondary | ICD-10-CM | POA: Diagnosis not present

## 2021-01-16 DIAGNOSIS — G8929 Other chronic pain: Secondary | ICD-10-CM | POA: Diagnosis not present

## 2021-01-16 DIAGNOSIS — I110 Hypertensive heart disease with heart failure: Secondary | ICD-10-CM | POA: Diagnosis not present

## 2021-01-29 DEATH — deceased
